# Patient Record
Sex: Female | Born: 1955 | Race: White | Hispanic: No | Marital: Married | Smoking: Never smoker
Health system: Southern US, Community
[De-identification: ages and names within clinical notes are randomized; demographics above are authoritative.]

## PROBLEM LIST (undated history)

## (undated) DIAGNOSIS — Z62819 Personal history of unspecified abuse in childhood: Secondary | ICD-10-CM

## (undated) DIAGNOSIS — K760 Fatty (change of) liver, not elsewhere classified: Secondary | ICD-10-CM

## (undated) DIAGNOSIS — F419 Anxiety disorder, unspecified: Secondary | ICD-10-CM

## (undated) DIAGNOSIS — T7840XA Allergy, unspecified, initial encounter: Secondary | ICD-10-CM

## (undated) DIAGNOSIS — F988 Other specified behavioral and emotional disorders with onset usually occurring in childhood and adolescence: Secondary | ICD-10-CM

## (undated) DIAGNOSIS — D649 Anemia, unspecified: Secondary | ICD-10-CM

## (undated) DIAGNOSIS — Z860100 Personal history of colon polyps, unspecified: Secondary | ICD-10-CM

## (undated) DIAGNOSIS — Z8719 Personal history of other diseases of the digestive system: Secondary | ICD-10-CM

## (undated) DIAGNOSIS — D219 Benign neoplasm of connective and other soft tissue, unspecified: Secondary | ICD-10-CM

## (undated) DIAGNOSIS — K623 Rectal prolapse: Secondary | ICD-10-CM

## (undated) DIAGNOSIS — M1990 Unspecified osteoarthritis, unspecified site: Secondary | ICD-10-CM

## (undated) DIAGNOSIS — H269 Unspecified cataract: Secondary | ICD-10-CM

## (undated) DIAGNOSIS — M810 Age-related osteoporosis without current pathological fracture: Secondary | ICD-10-CM

## (undated) DIAGNOSIS — U071 COVID-19: Secondary | ICD-10-CM

## (undated) DIAGNOSIS — K589 Irritable bowel syndrome without diarrhea: Secondary | ICD-10-CM

## (undated) DIAGNOSIS — R51 Headache: Secondary | ICD-10-CM

## (undated) DIAGNOSIS — E785 Hyperlipidemia, unspecified: Secondary | ICD-10-CM

## (undated) DIAGNOSIS — K219 Gastro-esophageal reflux disease without esophagitis: Secondary | ICD-10-CM

## (undated) DIAGNOSIS — K649 Unspecified hemorrhoids: Secondary | ICD-10-CM

## (undated) DIAGNOSIS — K602 Anal fissure, unspecified: Secondary | ICD-10-CM

## (undated) HISTORY — PX: COLONOSCOPY: SHX174

## (undated) HISTORY — DX: Anemia, unspecified: D64.9

## (undated) HISTORY — DX: Headache: R51

## (undated) HISTORY — PX: DILATION AND CURETTAGE OF UTERUS: SHX78

## (undated) HISTORY — DX: Unspecified hemorrhoids: K64.9

## (undated) HISTORY — DX: Personal history of colonic polyps: Z86.010

## (undated) HISTORY — DX: Other specified behavioral and emotional disorders with onset usually occurring in childhood and adolescence: F98.8

## (undated) HISTORY — PX: CATARACT EXTRACTION, BILATERAL: SHX1313

## (undated) HISTORY — DX: Allergy, unspecified, initial encounter: T78.40XA

## (undated) HISTORY — DX: Anal fissure, unspecified: K60.2

## (undated) HISTORY — DX: Personal history of colon polyps, unspecified: Z86.0100

## (undated) HISTORY — DX: Hyperlipidemia, unspecified: E78.5

## (undated) HISTORY — DX: Personal history of unspecified abuse in childhood: Z62.819

## (undated) HISTORY — DX: Age-related osteoporosis without current pathological fracture: M81.0

## (undated) HISTORY — PX: DENTAL SURGERY: SHX609

## (undated) HISTORY — DX: Unspecified cataract: H26.9

## (undated) HISTORY — PX: HEMORRHOID BANDING: SHX5850

## (undated) HISTORY — DX: Gastro-esophageal reflux disease without esophagitis: K21.9

## (undated) HISTORY — DX: COVID-19: U07.1

## (undated) HISTORY — DX: Benign neoplasm of connective and other soft tissue, unspecified: D21.9

## (undated) HISTORY — DX: Rectal prolapse: K62.3

---
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
## 1975-09-04 HISTORY — PX: TONSILLECTOMY: SUR1361

## 1976-09-03 HISTORY — PX: RHINOPLASTY: SUR1284

## 1998-07-07 ENCOUNTER — Other Ambulatory Visit: Admission: RE | Admit: 1998-07-07 | Discharge: 1998-07-07 | Payer: Self-pay | Admitting: Obstetrics and Gynecology

## 1999-09-27 ENCOUNTER — Other Ambulatory Visit: Admission: RE | Admit: 1999-09-27 | Discharge: 1999-09-27 | Payer: Self-pay | Admitting: Obstetrics and Gynecology

## 2000-08-20 ENCOUNTER — Other Ambulatory Visit: Admission: RE | Admit: 2000-08-20 | Discharge: 2000-08-20 | Payer: Self-pay | Admitting: Internal Medicine

## 2002-01-15 ENCOUNTER — Encounter: Payer: Self-pay | Admitting: Gastroenterology

## 2002-02-09 ENCOUNTER — Other Ambulatory Visit: Admission: RE | Admit: 2002-02-09 | Discharge: 2002-02-09 | Payer: Self-pay | Admitting: Internal Medicine

## 2003-05-28 ENCOUNTER — Ambulatory Visit (HOSPITAL_COMMUNITY): Admission: RE | Admit: 2003-05-28 | Discharge: 2003-05-28 | Payer: Self-pay | Admitting: Gastroenterology

## 2003-05-28 ENCOUNTER — Encounter (INDEPENDENT_AMBULATORY_CARE_PROVIDER_SITE_OTHER): Payer: Self-pay | Admitting: *Deleted

## 2004-10-24 ENCOUNTER — Ambulatory Visit: Payer: Self-pay | Admitting: Internal Medicine

## 2005-01-12 ENCOUNTER — Ambulatory Visit: Payer: Self-pay | Admitting: Internal Medicine

## 2005-01-24 ENCOUNTER — Other Ambulatory Visit: Admission: RE | Admit: 2005-01-24 | Discharge: 2005-01-24 | Payer: Self-pay | Admitting: Internal Medicine

## 2005-01-24 ENCOUNTER — Ambulatory Visit: Payer: Self-pay | Admitting: Internal Medicine

## 2005-03-21 ENCOUNTER — Ambulatory Visit: Payer: Self-pay | Admitting: Internal Medicine

## 2005-03-28 ENCOUNTER — Ambulatory Visit: Payer: Self-pay | Admitting: Internal Medicine

## 2005-07-25 ENCOUNTER — Ambulatory Visit (HOSPITAL_COMMUNITY): Admission: RE | Admit: 2005-07-25 | Discharge: 2005-07-25 | Payer: Self-pay | Admitting: *Deleted

## 2005-07-25 ENCOUNTER — Encounter (INDEPENDENT_AMBULATORY_CARE_PROVIDER_SITE_OTHER): Payer: Self-pay | Admitting: Specialist

## 2005-09-03 HISTORY — PX: HEMORRHOID SURGERY: SHX153

## 2005-12-06 ENCOUNTER — Ambulatory Visit: Payer: Self-pay | Admitting: Family Medicine

## 2005-12-17 ENCOUNTER — Ambulatory Visit: Payer: Self-pay | Admitting: Internal Medicine

## 2006-04-02 ENCOUNTER — Ambulatory Visit: Payer: Self-pay | Admitting: Internal Medicine

## 2006-04-30 ENCOUNTER — Ambulatory Visit: Payer: Self-pay | Admitting: Internal Medicine

## 2006-07-15 ENCOUNTER — Ambulatory Visit: Payer: Self-pay | Admitting: Internal Medicine

## 2006-07-16 ENCOUNTER — Encounter (INDEPENDENT_AMBULATORY_CARE_PROVIDER_SITE_OTHER): Payer: Self-pay | Admitting: Specialist

## 2006-07-16 ENCOUNTER — Ambulatory Visit: Payer: Self-pay | Admitting: Internal Medicine

## 2006-07-16 LAB — HM COLONOSCOPY

## 2006-07-30 ENCOUNTER — Ambulatory Visit: Payer: Self-pay | Admitting: Internal Medicine

## 2006-09-10 ENCOUNTER — Ambulatory Visit: Payer: Self-pay | Admitting: Internal Medicine

## 2006-11-06 ENCOUNTER — Ambulatory Visit: Payer: Self-pay | Admitting: Internal Medicine

## 2007-01-29 ENCOUNTER — Ambulatory Visit: Payer: Self-pay | Admitting: Internal Medicine

## 2007-03-03 ENCOUNTER — Ambulatory Visit: Payer: Self-pay | Admitting: Internal Medicine

## 2007-03-03 LAB — CONVERTED CEMR LAB
ALT: 24 units/L (ref 0–35)
AST: 24 units/L (ref 0–37)
Albumin: 3.2 g/dL — ABNORMAL LOW (ref 3.5–5.2)
Alkaline Phosphatase: 59 units/L (ref 39–117)
BUN: 12 mg/dL (ref 6–23)
Basophils Absolute: 0 10*3/uL (ref 0.0–0.1)
Basophils Relative: 0.1 % (ref 0.0–1.0)
Bilirubin, Direct: 0.1 mg/dL (ref 0.0–0.3)
CO2: 27 meq/L (ref 19–32)
Calcium: 8.7 mg/dL (ref 8.4–10.5)
Chloride: 111 meq/L (ref 96–112)
Cholesterol: 247 mg/dL (ref 0–200)
Creatinine, Ser: 0.8 mg/dL (ref 0.4–1.2)
Direct LDL: 174.5 mg/dL
Eosinophils Absolute: 0.2 10*3/uL (ref 0.0–0.6)
Eosinophils Relative: 2.7 % (ref 0.0–5.0)
GFR calc Af Amer: 97 mL/min
GFR calc non Af Amer: 80 mL/min
Glucose, Bld: 106 mg/dL — ABNORMAL HIGH (ref 70–99)
HCT: 41.8 % (ref 36.0–46.0)
HDL: 54.7 mg/dL (ref >39.0)
Hemoglobin: 14 g/dL (ref 12.0–15.0)
Lymphocytes Relative: 24.7 % (ref 12.0–46.0)
MCHC: 33.5 g/dL (ref 30.0–36.0)
MCV: 81.3 fL (ref 78.0–100.0)
Monocytes Absolute: 0.6 10*3/uL (ref 0.2–0.7)
Monocytes Relative: 8 % (ref 3.0–11.0)
Neutro Abs: 5.1 10*3/uL (ref 1.4–7.7)
Neutrophils Relative %: 64.5 % (ref 43.0–77.0)
Platelets: 255 10*3/uL (ref 150–400)
Potassium: 4.4 meq/L (ref 3.5–5.1)
RBC: 5.15 M/uL — ABNORMAL HIGH (ref 3.87–5.11)
RDW: 15 % — ABNORMAL HIGH (ref 11.5–14.6)
Sodium: 141 meq/L (ref 135–145)
TSH: 3.45 microintl units/mL (ref 0.35–5.50)
Total Bilirubin: 0.5 mg/dL (ref 0.3–1.2)
Total CHOL/HDL Ratio: 4.5
Total Protein: 6.3 g/dL (ref 6.0–8.3)
Triglycerides: 166 mg/dL — ABNORMAL HIGH (ref 0–149)
VLDL: 33 mg/dL (ref 0–40)
WBC: 7.9 10*3/uL (ref 4.5–10.5)

## 2007-03-10 ENCOUNTER — Ambulatory Visit: Payer: Self-pay | Admitting: Internal Medicine

## 2007-03-25 ENCOUNTER — Ambulatory Visit: Payer: Self-pay | Admitting: Pulmonary Disease

## 2007-07-09 ENCOUNTER — Ambulatory Visit: Payer: Self-pay | Admitting: Internal Medicine

## 2007-07-09 DIAGNOSIS — R7301 Impaired fasting glucose: Secondary | ICD-10-CM | POA: Insufficient documentation

## 2007-07-09 DIAGNOSIS — E785 Hyperlipidemia, unspecified: Secondary | ICD-10-CM | POA: Insufficient documentation

## 2007-07-10 LAB — CONVERTED CEMR LAB
ALT: 30 units/L (ref 0–35)
AST: 23 units/L (ref 0–37)
Cholesterol: 193 mg/dL (ref 0–200)
Glucose, Bld: 90 mg/dL (ref 70–99)
HDL: 49.6 mg/dL (ref >39.0)
LDL Cholesterol: 118 mg/dL — ABNORMAL HIGH (ref 0–99)
Total CHOL/HDL Ratio: 3.9
Triglycerides: 125 mg/dL (ref 0–149)
VLDL: 25 mg/dL (ref 0–40)

## 2007-07-17 ENCOUNTER — Ambulatory Visit: Payer: Self-pay | Admitting: Internal Medicine

## 2007-07-17 DIAGNOSIS — K219 Gastro-esophageal reflux disease without esophagitis: Secondary | ICD-10-CM | POA: Insufficient documentation

## 2007-07-17 DIAGNOSIS — J040 Acute laryngitis: Secondary | ICD-10-CM | POA: Insufficient documentation

## 2007-07-17 DIAGNOSIS — F988 Other specified behavioral and emotional disorders with onset usually occurring in childhood and adolescence: Secondary | ICD-10-CM | POA: Insufficient documentation

## 2007-07-17 LAB — CONVERTED CEMR LAB
Cholesterol, target level: 200 mg/dL
HDL goal, serum: 40 mg/dL
LDL Goal: 160 mg/dL

## 2007-08-15 ENCOUNTER — Ambulatory Visit: Payer: Self-pay | Admitting: Internal Medicine

## 2007-08-15 DIAGNOSIS — Z860100 Personal history of colon polyps, unspecified: Secondary | ICD-10-CM | POA: Insufficient documentation

## 2007-08-15 DIAGNOSIS — D649 Anemia, unspecified: Secondary | ICD-10-CM | POA: Insufficient documentation

## 2007-08-15 DIAGNOSIS — J309 Allergic rhinitis, unspecified: Secondary | ICD-10-CM | POA: Insufficient documentation

## 2007-08-15 DIAGNOSIS — R51 Headache: Secondary | ICD-10-CM | POA: Insufficient documentation

## 2007-08-15 DIAGNOSIS — R519 Headache, unspecified: Secondary | ICD-10-CM | POA: Insufficient documentation

## 2007-11-11 ENCOUNTER — Ambulatory Visit: Payer: Self-pay | Admitting: Internal Medicine

## 2007-11-11 DIAGNOSIS — N951 Menopausal and female climacteric states: Secondary | ICD-10-CM | POA: Insufficient documentation

## 2007-11-11 LAB — CONVERTED CEMR LAB: Blood Glucose, Fingerstick: 119

## 2008-03-04 ENCOUNTER — Telehealth: Payer: Self-pay | Admitting: Internal Medicine

## 2008-03-08 ENCOUNTER — Encounter: Payer: Self-pay | Admitting: Internal Medicine

## 2008-03-17 ENCOUNTER — Ambulatory Visit: Payer: Self-pay | Admitting: Internal Medicine

## 2008-03-17 LAB — CONVERTED CEMR LAB
Bilirubin Urine: NEGATIVE
Ketones, urine, test strip: NEGATIVE
Nitrite: NEGATIVE
Protein, U semiquant: NEGATIVE
Specific Gravity, Urine: >=1.03
Urobilinogen, UA: 0.2
WBC Urine, dipstick: NEGATIVE
pH: 5

## 2008-03-19 LAB — CONVERTED CEMR LAB
ALT: 45 units/L — ABNORMAL HIGH (ref 0–35)
AST: 40 units/L — ABNORMAL HIGH (ref 0–37)
Albumin: 3.5 g/dL (ref 3.5–5.2)
Alkaline Phosphatase: 62 units/L (ref 39–117)
BUN: 17 mg/dL (ref 6–23)
Basophils Absolute: 0 10*3/uL (ref 0.0–0.1)
Basophils Relative: 0.4 % (ref 0.0–1.0)
Bilirubin, Direct: 0.1 mg/dL (ref 0.0–0.3)
CO2: 29 meq/L (ref 19–32)
Calcium: 9.1 mg/dL (ref 8.4–10.5)
Chloride: 108 meq/L (ref 96–112)
Cholesterol: 171 mg/dL (ref 0–200)
Creatinine, Ser: 0.8 mg/dL (ref 0.4–1.2)
Eosinophils Absolute: 0.1 10*3/uL (ref 0.0–0.7)
Eosinophils Relative: 1 % (ref 0.0–5.0)
GFR calc Af Amer: 96 mL/min
GFR calc non Af Amer: 80 mL/min
Glucose, Bld: 132 mg/dL — ABNORMAL HIGH (ref 70–99)
HCT: 38.6 % (ref 36.0–46.0)
HDL: 52.4 mg/dL (ref >39.0)
Hemoglobin: 13.5 g/dL (ref 12.0–15.0)
LDL Cholesterol: 98 mg/dL (ref 0–99)
Lymphocytes Relative: 17.9 % (ref 12.0–46.0)
MCHC: 35.1 g/dL (ref 30.0–36.0)
MCV: 81.7 fL (ref 78.0–100.0)
Monocytes Absolute: 0.9 10*3/uL (ref 0.1–1.0)
Monocytes Relative: 9.1 % (ref 3.0–12.0)
Neutro Abs: 6.9 10*3/uL (ref 1.4–7.7)
Neutrophils Relative %: 71.6 % (ref 43.0–77.0)
Platelets: 227 10*3/uL (ref 150–400)
Potassium: 4.4 meq/L (ref 3.5–5.1)
RBC: 4.73 M/uL (ref 3.87–5.11)
RDW: 14.1 % (ref 11.5–14.6)
Sodium: 142 meq/L (ref 135–145)
TSH: 2.03 microintl units/mL (ref 0.35–5.50)
Total Bilirubin: 0.6 mg/dL (ref 0.3–1.2)
Total CHOL/HDL Ratio: 3.3
Total Protein: 6.3 g/dL (ref 6.0–8.3)
Triglycerides: 103 mg/dL (ref 0–149)
VLDL: 21 mg/dL (ref 0–40)
WBC: 9.6 10*3/uL (ref 4.5–10.5)

## 2008-03-24 ENCOUNTER — Other Ambulatory Visit: Admission: RE | Admit: 2008-03-24 | Discharge: 2008-03-24 | Payer: Self-pay | Admitting: Internal Medicine

## 2008-03-24 ENCOUNTER — Ambulatory Visit: Payer: Self-pay | Admitting: Internal Medicine

## 2008-03-24 ENCOUNTER — Encounter: Payer: Self-pay | Admitting: Internal Medicine

## 2008-03-24 DIAGNOSIS — R740 Nonspecific elevation of levels of transaminase and lactic acid dehydrogenase [LDH]: Secondary | ICD-10-CM

## 2008-03-24 DIAGNOSIS — E669 Obesity, unspecified: Secondary | ICD-10-CM | POA: Insufficient documentation

## 2008-03-24 DIAGNOSIS — R7401 Elevation of levels of liver transaminase levels: Secondary | ICD-10-CM | POA: Insufficient documentation

## 2008-04-28 DIAGNOSIS — Z872 Personal history of diseases of the skin and subcutaneous tissue: Secondary | ICD-10-CM | POA: Insufficient documentation

## 2008-04-29 ENCOUNTER — Ambulatory Visit: Payer: Self-pay | Admitting: Internal Medicine

## 2008-04-29 DIAGNOSIS — R1084 Generalized abdominal pain: Secondary | ICD-10-CM | POA: Insufficient documentation

## 2008-04-30 ENCOUNTER — Ambulatory Visit (HOSPITAL_COMMUNITY): Admission: RE | Admit: 2008-04-30 | Discharge: 2008-04-30 | Payer: Self-pay | Admitting: Internal Medicine

## 2008-05-25 ENCOUNTER — Ambulatory Visit: Payer: Self-pay | Admitting: Internal Medicine

## 2008-05-25 LAB — CONVERTED CEMR LAB
ALT: 40 units/L — ABNORMAL HIGH (ref 0–35)
AST: 40 units/L — ABNORMAL HIGH (ref 0–37)
Albumin: 3.2 g/dL — ABNORMAL LOW (ref 3.5–5.2)
Alkaline Phosphatase: 67 units/L (ref 39–117)
Bilirubin, Direct: 0.1 mg/dL (ref 0.0–0.3)
Hgb A1c MFr Bld: 6.9 % — ABNORMAL HIGH (ref 4.6–6.0)
Total Bilirubin: 0.5 mg/dL (ref 0.3–1.2)
Total Protein: 5.7 g/dL — ABNORMAL LOW (ref 6.0–8.3)

## 2008-05-26 ENCOUNTER — Ambulatory Visit: Payer: Self-pay | Admitting: Internal Medicine

## 2008-05-26 DIAGNOSIS — B029 Zoster without complications: Secondary | ICD-10-CM | POA: Insufficient documentation

## 2008-05-27 ENCOUNTER — Telehealth: Payer: Self-pay | Admitting: Internal Medicine

## 2008-06-01 ENCOUNTER — Ambulatory Visit: Payer: Self-pay | Admitting: Internal Medicine

## 2008-06-01 DIAGNOSIS — K7689 Other specified diseases of liver: Secondary | ICD-10-CM | POA: Insufficient documentation

## 2008-09-10 ENCOUNTER — Ambulatory Visit: Payer: Self-pay | Admitting: Internal Medicine

## 2008-09-10 LAB — CONVERTED CEMR LAB
ALT: 38 units/L — ABNORMAL HIGH (ref 0–35)
AST: 33 units/L (ref 0–37)
Albumin: 3 g/dL — ABNORMAL LOW (ref 3.5–5.2)
Alkaline Phosphatase: 67 units/L (ref 39–117)
Bilirubin, Direct: 0.1 mg/dL (ref 0.0–0.3)
Hgb A1c MFr Bld: 6.9 % — ABNORMAL HIGH (ref 4.6–6.0)
Total Bilirubin: 0.6 mg/dL (ref 0.3–1.2)
Total Protein: 5.8 g/dL — ABNORMAL LOW (ref 6.0–8.3)

## 2008-09-17 ENCOUNTER — Ambulatory Visit: Payer: Self-pay | Admitting: Internal Medicine

## 2008-11-29 ENCOUNTER — Telehealth: Payer: Self-pay | Admitting: Internal Medicine

## 2009-02-08 ENCOUNTER — Ambulatory Visit: Payer: Self-pay | Admitting: Internal Medicine

## 2009-02-08 DIAGNOSIS — R609 Edema, unspecified: Secondary | ICD-10-CM | POA: Insufficient documentation

## 2009-02-08 DIAGNOSIS — T50995A Adverse effect of other drugs, medicaments and biological substances, initial encounter: Secondary | ICD-10-CM | POA: Insufficient documentation

## 2009-02-08 LAB — CONVERTED CEMR LAB
Bilirubin Urine: NEGATIVE
Blood in Urine, dipstick: NEGATIVE
Glucose, Urine, Semiquant: NEGATIVE
Ketones, urine, test strip: NEGATIVE
Nitrite: NEGATIVE
Protein, U semiquant: NEGATIVE
Specific Gravity, Urine: 1.025
Urobilinogen, UA: 0.2
pH: 5

## 2009-02-11 LAB — CONVERTED CEMR LAB
ALT: 40 units/L — ABNORMAL HIGH (ref 0–35)
AST: 28 units/L (ref 0–37)
Albumin: 3.4 g/dL — ABNORMAL LOW (ref 3.5–5.2)
Alkaline Phosphatase: 62 units/L (ref 39–117)
BUN: 16 mg/dL (ref 6–23)
Basophils Absolute: 0.1 10*3/uL (ref 0.0–0.1)
Basophils Relative: 0.8 % (ref 0.0–3.0)
Bilirubin, Direct: 0 mg/dL (ref 0.0–0.3)
CO2: 30 meq/L (ref 19–32)
Calcium: 8.7 mg/dL (ref 8.4–10.5)
Chloride: 112 meq/L (ref 96–112)
Cholesterol: 152 mg/dL (ref 0–200)
Creatinine, Ser: 0.9 mg/dL (ref 0.4–1.2)
Eosinophils Absolute: 0.1 10*3/uL (ref 0.0–0.7)
Eosinophils Relative: 1.9 % (ref 0.0–5.0)
GFR calc non Af Amer: 69.21 mL/min (ref >60)
Glucose, Bld: 122 mg/dL — ABNORMAL HIGH (ref 70–99)
HCT: 36.6 % (ref 36.0–46.0)
HDL: 49.9 mg/dL (ref >39.00)
Hemoglobin: 12.2 g/dL (ref 12.0–15.0)
Hgb A1c MFr Bld: 7.2 % — ABNORMAL HIGH (ref 4.6–6.5)
LDL Cholesterol: 84 mg/dL (ref 0–99)
Lymphocytes Relative: 21.7 % (ref 12.0–46.0)
Lymphs Abs: 1.7 10*3/uL (ref 0.7–4.0)
MCHC: 33.4 g/dL (ref 30.0–36.0)
MCV: 82 fL (ref 78.0–100.0)
Monocytes Absolute: 0.6 10*3/uL (ref 0.1–1.0)
Monocytes Relative: 7.2 % (ref 3.0–12.0)
Neutro Abs: 5.2 10*3/uL (ref 1.4–7.7)
Neutrophils Relative %: 68.4 % (ref 43.0–77.0)
Platelets: 186 10*3/uL (ref 150.0–400.0)
Potassium: 4 meq/L (ref 3.5–5.1)
RBC: 4.46 M/uL (ref 3.87–5.11)
RDW: 13.7 % (ref 11.5–14.6)
Sodium: 144 meq/L (ref 135–145)
TSH: 1.46 microintl units/mL (ref 0.35–5.50)
Total Bilirubin: 0.6 mg/dL (ref 0.3–1.2)
Total CHOL/HDL Ratio: 3
Total Protein: 6.6 g/dL (ref 6.0–8.3)
Triglycerides: 90 mg/dL (ref 0.0–149.0)
VLDL: 18 mg/dL (ref 0.0–40.0)
WBC: 7.7 10*3/uL (ref 4.5–10.5)

## 2009-02-23 ENCOUNTER — Other Ambulatory Visit: Admission: RE | Admit: 2009-02-23 | Discharge: 2009-02-23 | Payer: Self-pay | Admitting: Internal Medicine

## 2009-02-23 ENCOUNTER — Encounter: Payer: Self-pay | Admitting: Internal Medicine

## 2009-02-23 ENCOUNTER — Ambulatory Visit: Payer: Self-pay | Admitting: Internal Medicine

## 2009-02-23 LAB — CONVERTED CEMR LAB: Blood Glucose, Fingerstick: 132

## 2009-03-21 ENCOUNTER — Encounter: Payer: Self-pay | Admitting: Internal Medicine

## 2009-04-05 ENCOUNTER — Telehealth: Payer: Self-pay | Admitting: Internal Medicine

## 2009-05-03 ENCOUNTER — Encounter: Payer: Self-pay | Admitting: Internal Medicine

## 2009-08-23 ENCOUNTER — Telehealth: Payer: Self-pay | Admitting: *Deleted

## 2009-09-09 ENCOUNTER — Ambulatory Visit: Payer: Self-pay | Admitting: Internal Medicine

## 2009-09-09 DIAGNOSIS — M79609 Pain in unspecified limb: Secondary | ICD-10-CM | POA: Insufficient documentation

## 2009-09-09 LAB — CONVERTED CEMR LAB: Blood Glucose, Fingerstick: 139

## 2009-09-12 LAB — CONVERTED CEMR LAB
ALT: 60 units/L — ABNORMAL HIGH (ref 0–35)
AST: 57 units/L — ABNORMAL HIGH (ref 0–37)
Albumin: 3.4 g/dL — ABNORMAL LOW (ref 3.5–5.2)
Alkaline Phosphatase: 68 units/L (ref 39–117)
BUN: 11 mg/dL (ref 6–23)
Bilirubin, Direct: 0.1 mg/dL (ref 0.0–0.3)
CO2: 28 meq/L (ref 19–32)
Calcium: 8.9 mg/dL (ref 8.4–10.5)
Chloride: 108 meq/L (ref 96–112)
Creatinine, Ser: 0.9 mg/dL (ref 0.4–1.2)
GFR calc non Af Amer: 69.06 mL/min (ref >60)
Glucose, Bld: 134 mg/dL — ABNORMAL HIGH (ref 70–99)
Hgb A1c MFr Bld: 7.5 % — ABNORMAL HIGH (ref 4.6–6.5)
Potassium: 3.7 meq/L (ref 3.5–5.1)
Sodium: 142 meq/L (ref 135–145)
Total Bilirubin: 0.7 mg/dL (ref 0.3–1.2)
Total Protein: 6.6 g/dL (ref 6.0–8.3)

## 2009-11-18 ENCOUNTER — Ambulatory Visit: Payer: Self-pay | Admitting: Internal Medicine

## 2009-11-18 DIAGNOSIS — E1165 Type 2 diabetes mellitus with hyperglycemia: Secondary | ICD-10-CM | POA: Insufficient documentation

## 2009-11-18 DIAGNOSIS — Z794 Long term (current) use of insulin: Secondary | ICD-10-CM

## 2009-11-18 LAB — HM DIABETES FOOT EXAM

## 2009-11-18 LAB — CONVERTED CEMR LAB: LDL Goal: 100 mg/dL

## 2009-11-29 LAB — CONVERTED CEMR LAB
ALT: 46 units/L — ABNORMAL HIGH (ref 0–35)
AST: 32 units/L (ref 0–37)
Albumin: 3.6 g/dL (ref 3.5–5.2)
Alkaline Phosphatase: 74 units/L (ref 39–117)
BUN: 14 mg/dL (ref 6–23)
Bilirubin, Direct: 0.1 mg/dL (ref 0.0–0.3)
CO2: 30 meq/L (ref 19–32)
Calcium: 8.9 mg/dL (ref 8.4–10.5)
Chloride: 107 meq/L (ref 96–112)
Creatinine, Ser: 0.9 mg/dL (ref 0.4–1.2)
GFR calc non Af Amer: 69.01 mL/min (ref >60)
Glucose, Bld: 123 mg/dL — ABNORMAL HIGH (ref 70–99)
Hgb A1c MFr Bld: 7.5 % — ABNORMAL HIGH (ref 4.6–6.5)
Potassium: 4.6 meq/L (ref 3.5–5.1)
Sodium: 143 meq/L (ref 135–145)
Total Bilirubin: 0.3 mg/dL (ref 0.3–1.2)
Total Protein: 6.9 g/dL (ref 6.0–8.3)

## 2010-01-16 ENCOUNTER — Telehealth: Payer: Self-pay | Admitting: *Deleted

## 2010-02-21 ENCOUNTER — Telehealth: Payer: Self-pay | Admitting: *Deleted

## 2010-02-21 ENCOUNTER — Ambulatory Visit: Payer: Self-pay | Admitting: Internal Medicine

## 2010-02-21 LAB — CONVERTED CEMR LAB
ALT: 40 units/L — ABNORMAL HIGH (ref 0–35)
AST: 30 units/L (ref 0–37)
Albumin: 3.5 g/dL (ref 3.5–5.2)
Alkaline Phosphatase: 76 units/L (ref 39–117)
BUN: 16 mg/dL (ref 6–23)
Basophils Absolute: 0 10*3/uL (ref 0.0–0.1)
Basophils Relative: 0.4 % (ref 0.0–3.0)
Bilirubin Urine: NEGATIVE
Bilirubin, Direct: 0.1 mg/dL (ref 0.0–0.3)
Blood in Urine, dipstick: NEGATIVE
CO2: 28 meq/L (ref 19–32)
Calcium: 8.7 mg/dL (ref 8.4–10.5)
Chloride: 107 meq/L (ref 96–112)
Cholesterol: 161 mg/dL (ref 0–200)
Creatinine, Ser: 1 mg/dL (ref 0.4–1.2)
Creatinine,U: 254.7 mg/dL
Eosinophils Absolute: 0.3 10*3/uL (ref 0.0–0.7)
Eosinophils Relative: 4.5 % (ref 0.0–5.0)
GFR calc non Af Amer: 63.24 mL/min (ref >60)
Glucose, Bld: 163 mg/dL — ABNORMAL HIGH (ref 70–99)
Glucose, Urine, Semiquant: NEGATIVE
HCT: 38.6 % (ref 36.0–46.0)
HDL: 49 mg/dL (ref >39.00)
Hemoglobin: 12.9 g/dL (ref 12.0–15.0)
Hgb A1c MFr Bld: 7.4 % — ABNORMAL HIGH (ref 4.6–6.5)
Ketones, urine, test strip: NEGATIVE
LDL Cholesterol: 90 mg/dL (ref 0–99)
Lymphocytes Relative: 25.2 % (ref 12.0–46.0)
Lymphs Abs: 1.7 10*3/uL (ref 0.7–4.0)
MCHC: 33.6 g/dL (ref 30.0–36.0)
MCV: 83.1 fL (ref 78.0–100.0)
Microalb Creat Ratio: 0.4 mg/g (ref 0.0–30.0)
Microalb, Ur: 0.9 mg/dL (ref 0.0–1.9)
Monocytes Absolute: 0.5 10*3/uL (ref 0.1–1.0)
Monocytes Relative: 7.7 % (ref 3.0–12.0)
Neutro Abs: 4.2 10*3/uL (ref 1.4–7.7)
Neutrophils Relative %: 62.2 % (ref 43.0–77.0)
Nitrite: NEGATIVE
Platelets: 174 10*3/uL (ref 150.0–400.0)
Potassium: 4.7 meq/L (ref 3.5–5.1)
Protein, U semiquant: NEGATIVE
RBC: 4.64 M/uL (ref 3.87–5.11)
RDW: 15.3 % — ABNORMAL HIGH (ref 11.5–14.6)
Sodium: 144 meq/L (ref 135–145)
Specific Gravity, Urine: >=1.03
TSH: 2.26 microintl units/mL (ref 0.35–5.50)
Total Bilirubin: 0.4 mg/dL (ref 0.3–1.2)
Total CHOL/HDL Ratio: 3
Total Protein: 6.2 g/dL (ref 6.0–8.3)
Triglycerides: 111 mg/dL (ref 0.0–149.0)
Urobilinogen, UA: 0.2
VLDL: 22.2 mg/dL (ref 0.0–40.0)
WBC: 6.8 10*3/uL (ref 4.5–10.5)
pH: 5

## 2010-02-28 ENCOUNTER — Other Ambulatory Visit: Admission: RE | Admit: 2010-02-28 | Discharge: 2010-02-28 | Payer: Self-pay | Admitting: Internal Medicine

## 2010-02-28 ENCOUNTER — Ambulatory Visit: Payer: Self-pay | Admitting: Internal Medicine

## 2010-02-28 DIAGNOSIS — F4323 Adjustment disorder with mixed anxiety and depressed mood: Secondary | ICD-10-CM | POA: Insufficient documentation

## 2010-02-28 LAB — HM PAP SMEAR

## 2010-03-01 ENCOUNTER — Encounter: Payer: Self-pay | Admitting: Internal Medicine

## 2010-04-25 ENCOUNTER — Encounter: Payer: Self-pay | Admitting: Internal Medicine

## 2010-04-25 LAB — HM DIABETES EYE EXAM: HM Diabetic Eye Exam: NORMAL

## 2010-05-03 ENCOUNTER — Encounter: Payer: Self-pay | Admitting: Internal Medicine

## 2010-05-10 ENCOUNTER — Telehealth: Payer: Self-pay | Admitting: *Deleted

## 2010-05-25 ENCOUNTER — Encounter: Payer: Self-pay | Admitting: *Deleted

## 2010-08-04 ENCOUNTER — Telehealth: Payer: Self-pay | Admitting: Internal Medicine

## 2010-10-03 NOTE — Progress Notes (Signed)
Summary: med refill  Phone Note Refill Request Call back at Home Phone [phone] Message from:  [facility] [phone]  Refills Requested: Medication #1:  CITALOPRAM HYDROBROMIDE 20 MG TABS 1 by mouth once daily pt needs  refill med is working 80 percent. pt will make ov  Initial call taken by: Heron Sabins,  August 04, 2010 12:28 PM  Follow-up for Phone Call        tell patient  sent rx in for refill   Follow-up by: Madelin Headings MD,  August 04, 2010 5:36 PM  Additional Follow-up for Phone Call Additional follow up Details #1::        called pt - ans mach  - LMTCB if questions - [facility] Additional Follow-up by: Duard Brady LPN,  August 04, 2010 5:53 PM    Prescriptions: CITALOPRAM HYDROBROMIDE 20 MG TABS (CITALOPRAM HYDROBROMIDE) 1 by mouth once daily  #30 x 2   Entered and Authorized by:   Madelin Headings MD   Signed by:   Madelin Headings MD on 08/04/2010   Method used:   Electronically to        [facility]       [address]       [place], [place]  [address]       Ph: [phone]       Fax: [phone]   RxID:   [id number]

## 2010-10-03 NOTE — Assessment & Plan Note (Signed)
Summary: 2 month rov/pt will come in fasting/njr   Vital Signs:  Patient profile:   55 year old female Menstrual status:  perimenopausal Height:      64 inches Weight:      254 pounds BMI:     43.76 Temp:     97.8 degrees F Pulse rate:   78 / minute Pulse rhythm:   regular Resp:     12 per minute BP sitting:   120 / 78  Vitals Entered By: Lynann Beaver CMA (November 18, 2009 10:27 AM)  Nutrition Counseling: Patient's BMI is greater than 25 and therefore counseled on weight management options.  Family History: Reviewed history from 09/17/2008 and no changes required. adhd child clotting Family History of Alcoholism/Addiction Family History of Arthritis Family History Hypertension MOM renal Cancer Father Vascular disease Tobacco DM DM, Emotional  alcohol problems in the family Family History of Diabetes: Sisters, Father Family History of Kidney Disease: Cancer-Mother       Social History: Reviewed history from 09/17/2008 and no changes required. Married Never Smoked 2 children No   alcohol     Past History:  Past medical, surgical, family and social histories (including risk factors) reviewed, and no changes noted (except as noted below).  Past Medical History: Allergic rhinitis Anemia-NOS GERD had egd Hyperlipidemia add rectal prolapse  Brodie Headache [place] Colonic polyps, hx of  Kaplan fibroids Anal Fissure Hemorrhoids   Hx of chid abuse  DM dx 2011    Consults: Dr. Lina Sar Dr. Bjorn Pippin Dr. Susa Raring  Past Surgical History: Reviewed history from 09/17/2008 and no changes required. childbirth  G5P2 Hemorrhoidectomy 2007 rhinoplasty 1978   Tonsillectomy D &C X 4   CC: rov Is Patient Diabetic? No Pain Assessment Patient in pain? no        History of Present Illness: .PT  comesin for follow up about new dx of DM .    Has cut out   sugar beverages  and  bread.      No sweet tea.  Continuing [place] and   liptor.   tried   generally  diet change but no intensive.   Is trying to change  gabits taking the [place] without problem NOt on metformin because of diarrhea.   No new diagnosis.  Diabetes Management History:      She says that she is not exercising regularly.    Physical Exam  General:  alert, well-developed, and well-nourished.   Neck:  No deformities, masses, or tenderness noted. Lungs:  Normal respiratory effort, chest expands symmetrically. Lungs are clear to auscultation, no crackles or wheezes. Heart:  Normal rate and regular rhythm. S1 and S2 normal without gallop, murmur, click, rub or other extra sounds. Abdomen:  Bowel sounds positive,abdomen soft and non-tender without masses, organomegaly or   noted.  Diabetes Management Exam:    Foot Exam (with socks and/or shoes not present):       Sensory-Monofilament:          Left foot: normal          Right foot: normal       Inspection:          Left foot: normal          Right foot: normal   Current Medications (verified): 1)  Imodium A-D 2 Mg Tabs (Loperamide Hcl) .... Take 1 Tablet By Mouth Every Night 2)  Lipitor 20 Mg Tabs (Atorvastatin Calcium) .... Take 1 Tablet By Mouth Once A Day  3)  Adderall 10 Mg  Tabs (Amphetamine-Dextroamphetamine) .... Take 3 Per  Day  As Directed. 4)  Nexium 40 Mg  Cpdr (Esomeprazole Magnesium) .Marland Kitchen.. 1 By Mouth Two Times A Day 5)  Peranex Hc 3-1 %  Pads (Lidocaine-Hydrocortisone Ace) .... Apply To Rectum Two Times A Day As Directed 6)  Peranex Hc 2-2 % Kit (Lidocaine-Hydrocortisone Ace) .... Apply To Rectum As Needed 7)  Lasix 20 Mg Tabs (Furosemide) .Marland Kitchen.. 1 By Mouth Once Daily As Directed 8)  Januvia 100 Mg Tabs (Sitagliptin Phosphate) .Marland Kitchen.. 1 By Mouth Once Daily 9)  Alprazolam 0.25 Mg Tabs (Alprazolam) .Marland Kitchen.. 1-2 By Mouth Pre Flight  Allergies (verified): 1)  Codeine Phosphate (Codeine Phosphate)  Review of Systems  The patient denies anorexia, fever, weight loss, chest pain, syncope,  dyspnea on exertion, peripheral edema, abdominal pain, difficulty walking, abnormal bleeding, and angioedema.      Impression & Recommendations:  Problem # 1:  DIABETES MELLITUS (ICD-250.00) Assessment New see past note    now meets criteria  for diabetees albeit early and no symptom .  counseled about impotance of lifestyle intervention unfotuantely had gi se of metformin although poss could retry low dose in the future.   Her updated medication list for this problem includes:    Januvia 100 Mg Tabs (Sitagliptin phosphate) .Marland Kitchen... 1 by mouth once daily  Orders: TLB-BMP (Basic Metabolic Panel-BMET) (80048-METABOL) TLB-A1C / Hgb A1C (Glycohemoglobin) (83036-A1C) Venipuncture ([address])  Problem # 2:  FATTY LIVER DISEASE (ICD-571.8) as above Orders: TLB-Hepatic/Liver Function Pnl (80076-HEPATIC) Venipuncture (60454)  Problem # 3:  OBESITY (ICD-278.00)  Problem # 4:  HYPERLIPIDEMIA (ICD-272.4)  Her updated medication list for this problem includes:    Lipitor 20 Mg Tabs (Atorvastatin calcium) .Marland Kitchen... Take 1 tablet by mouth once a day  Labs Reviewed: SGOT: 57 (09/09/2009)   SGPT: 60 (09/09/2009)  Lipid Goals: Chol Goal: 200 (07/17/2007)   HDL Goal: 40 (07/17/2007)   LDL Goal: 160 (07/17/2007)   TG Goal: 150 (07/17/2007)  Prior 10 Yr Risk Heart Disease: 4 % (11/11/2007)   HDL:49.90 (02/08/2009), 52.4 (03/17/2008)  LDL:84 (02/08/2009), 98 (03/17/2008)  Chol:152 (02/08/2009), 171 (03/17/2008)  Trig:90.0 (02/08/2009), 103 (03/17/2008)  Complete Medication List: 1)  Imodium A-d 2 Mg Tabs (Loperamide hcl) .... Take 1 tablet by mouth every night 2)  Lipitor 20 Mg Tabs (Atorvastatin calcium) .... Take 1 tablet by mouth once a day 3)  Adderall 10 Mg Tabs (Amphetamine-dextroamphetamine) .... Take 3 per  day  as directed. 4)  Nexium 40 Mg Cpdr (Esomeprazole magnesium) .Marland Kitchen.. 1 by mouth two times a day 5)  Peranex Hc 3-1 % Pads (Lidocaine-hydrocortisone ace) .... Apply to rectum two times a day as  directed 6)  Peranex Hc 2-2 % Kit (Lidocaine-hydrocortisone ace) .... Apply to rectum as needed 7)  Lasix 20 Mg Tabs (Furosemide) .Marland Kitchen.. 1 by mouth once daily as directed 8)  Januvia 100 Mg Tabs (Sitagliptin phosphate) .Marland Kitchen.. 1 by mouth once daily 9)  Alprazolam 0.25 Mg Tabs (Alprazolam) .Marland Kitchen.. 1-2 by mouth pre flight  Diabetes Management Assessment/Plan:      The following lipid goals have been established for the patient: Total cholesterol goal of 200; LDL cholesterol goal of 100; HDL cholesterol goal of 40; Triglyceride goal of 150.     Patient Instructions: 1)  You will be informed of lab results when available.  2)  then plan follow up. 3)  Lose weight   continue limiting sugar and calories. 4)  3500 calories is a pound.  greater than 50% of visit spent in counseling  25 minutes

## 2010-10-03 NOTE — Progress Notes (Signed)
Summary: generic rx  Phone Note Call from Patient Call back at Work Phone [phone]   Caller: Patient Call For: Madelin Headings MD Summary of Call: pt needs generic [facility]anax [facility] Initial call taken by: Heron Sabins,  February 21, 2010 9:42 AM  Follow-up for Phone Call        ok to refill x 2  Follow-up by: Madelin Headings MD,  February 21, 2010 6:14 PM  Additional Follow-up for Phone Call Additional follow up Details #1::        Rx called in. Additional Follow-up by: Romualdo Bolk, CMA (AAMA),  February 22, 2010 9:30 AM    Prescriptions: ALPRAZOLAM 0.25 MG TABS (ALPRAZOLAM) 1-2 by mouth pre flight  #10 x 1   Entered by:   Romualdo Bolk, CMA (AAMA)   Authorized by:   Madelin Headings MD   Signed by:   Romualdo Bolk, CMA (AAMA) on 02/22/2010   Method used:   Telephoned to ...       [facility]       [address]       [place], [place]  [address]       Ph: [phone]       Fax: [phone]   RxID:   [id number]

## 2010-10-03 NOTE — Letter (Signed)
Summary: [facility]  [facility]   Imported By: Lennie Odor 05/31/2010 14:48:44  _____________________________________________________________________  External Attachment:    Type:   Image     Comment:   External Document

## 2010-10-03 NOTE — Miscellaneous (Signed)
Summary: Eye Exam   Clinical Lists Changes  Observations: Added new observation of EYES COMMENT: 05/2011 (05/25/2010 14:17) Added new observation of EYE EXAM BY: [facility] (04/25/2010 14:18) Added new observation of DMEYEEXMRES: normal (04/25/2010 14:18) Added new observation of DIAB EYE EX: normal (04/25/2010 14:18)        Diabetes Management History:      She says that she is not exercising regularly.    Diabetes Management Exam:    Eye Exam:       Eye Exam done elsewhere          Date: 04/25/2010          Results: normal          Done by: [facility]  Diabetes Management Assessment/Plan:      The following lipid goals have been established for the patient: Total cholesterol goal of 200; LDL cholesterol goal of 100; HDL cholesterol goal of 40; Triglyceride goal of 150.

## 2010-10-03 NOTE — Medication Information (Signed)
Summary: Authorization Request and Approval-Frova  Authorization Request and Approval-Frova   Imported By: Maryln Gottron 03/03/2010 12:58:25  _____________________________________________________________________  External Attachment:    Type:   Image     Comment:   External Document

## 2010-10-03 NOTE — Assessment & Plan Note (Signed)
Summary: cpx/pap/njr   Vital Signs:  Patient profile:   55 year old female Menstrual status:  perimenopausal LMP:     11/01/2008 Height:      64 inches Weight:      256 pounds Pulse rate:   66 / minute BP sitting:   130 / 76  (right arm) Cuff size:   large  Vitals Entered By: Romualdo Bolk, CMA (AAMA) (February 28, 2010 1:23 PM) CC: CPX with pap LMP (date): 11/01/2008 LMP - Character: heavy Menarche (age onset years): 12 1/2   Menses interval (days): vary Menstrual flow (days): 3 weeks Enter LMP: 11/01/2008 Last PAP Result NEGATIVE FOR INTRAEPITHELIAL LESIONS OR MALIGNANCY.   History of Present Illness: Paige Hernandez comes in today  for preventive visit .   Since last visit  here  there have been no major changes in health status  but has had lots of stresses and not been able to lose weight   LIPids  No se of meds  GERD  nededd 2 per day to help nexiium worsk the best  ADHD needs meds for as needed.   Anxiety ; worse at menopaus Sleep  5-6 hours per day  Migraines :    are back. taking excepdrin or tylenol and getting rebound.  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   Preventive Care Screening  Mammogram:    Date:  03/03/2009    Results:  normal   Prior Values:    Pap Smear:  NEGATIVE FOR INTRAEPITHELIAL LESIONS OR MALIGNANCY. (02/23/2009)    Mammogram:  normal (02/02/2008)    Colonoscopy:  Location:  [facility].   (07/16/2006)    Last Tetanus Booster:  Historical (09/03/2002)    Last Flu Shot:  Fluvax 3+ (09/09/2009)   Preventive Screening-Counseling & Management  Alcohol-Tobacco     Alcohol drinks/day: 0     Smoking Status: never     Passive Smoke Exposure: no  Caffeine-Diet-Exercise     Caffeine use/day: 2     Does Patient Exercise: no  Hep-HIV-STD-Contraception     Dental Visit-last 6 months yes     Sun Exposure-Excessive: no  Safety-Violence-Falls     Seat Belt Use: yes     Firearms in the Home: no firearms in the  home     Smoke Detectors: yes     Violence in the Home: not applicable     Sexual Abuse: no  Current Medications (verified): 1)  Imodium A-D 2 Mg Tabs (Loperamide Hcl) .... Take 1 Tablet By Mouth Every Night 2)  Lipitor 20 Mg Tabs (Atorvastatin Calcium) .... Take 1 Tablet By Mouth Once A Day 3)  Adderall 10 Mg  Tabs (Amphetamine-Dextroamphetamine) .... Take 3 Per  Day  As Directed. 4)  Nexium 40 Mg  Cpdr (Esomeprazole Magnesium) .Marland Kitchen.. 1 By Mouth Two Times A Day 5)  Peranex Hc 3-1 %  Pads (Lidocaine-Hydrocortisone Ace) .... Apply To Rectum Two Times A Day As Directed 6)  Peranex Hc 2-2 % Kit (Lidocaine-Hydrocortisone Ace) .... Apply To Rectum As Needed 7)  Lasix 20 Mg Tabs (Furosemide) .Marland Kitchen.. 1 By Mouth Once Daily As Directed 8)  Januvia 100 Mg Tabs (Sitagliptin Phosphate) .Marland Kitchen.. 1 By Mouth Once Daily 9)  Alprazolam 0.25 Mg Tabs (Alprazolam) .Marland Kitchen.. 1-2 By Mouth Pre Flight 10)  Glimepiride 1 Mg Tabs (Glimepiride) .Marland Kitchen.. 1 By Mouth Once Daily 11)  Frova 2.5 Mg Tabs (Frovatriptan Succinate) .... Use As Directed  Allergies (verified): 1)  Codeine Phosphate (Codeine  Phosphate)  Past History:  Past medical, surgical, family and social histories (including risk factors) reviewed, and no changes noted (except as noted below).  Past Medical History: Reviewed history from 11/18/2009 and no changes required. Allergic rhinitis Anemia-NOS GERD had egd Hyperlipidemia add rectal prolapse  Brodie Headache [place] Colonic polyps, hx of  Kaplan fibroids Anal Fissure Hemorrhoids   Hx of chid abuse  DM dx 2011    Consults: Dr. Lina Sar Dr. Bjorn Pippin Dr. Susa Raring  Past Surgical History: Reviewed history from 09/17/2008 and no changes required. childbirth  G5P2 Hemorrhoidectomy 2007 rhinoplasty 1978   Tonsillectomy D &C X 4  Past History:  Care Management: Gastroenterology: [facility]  in the past for HA s Orthopedics: [facility]  Family  History: Reviewed history from 09/17/2008 and no changes required. adhd child clotting Family History of Alcoholism/Addiction Family History of Arthritis Family History Hypertension MOM renal Cancer Father Vascular disease Tobacco DM DM, Emotional  alcohol problems in the family Family History of Diabetes: Sisters, Father Family History of Kidney Disease: Cancer-Mother       Social History: Reviewed history from 09/17/2008 and no changes required. Married Never Smoked 2 children No   alcohol   61 yo and 52 yo  at home   Review of Systems  The patient denies anorexia, fever, weight loss, vision loss, decreased hearing, hoarseness, chest pain, syncope, dyspnea on exertion, peripheral edema, prolonged cough, headaches, hemoptysis, abdominal pain, melena, hematochezia, severe indigestion/heartburn, hematuria, incontinence, genital sores, muscle weakness, suspicious skin lesions, transient blindness, difficulty walking, unusual weight change, abnormal bleeding, enlarged lymph nodes, angioedema, and breast masses.         intermettent diarrhea  hx of fissue   Physical Exam  Genitalia:  Pelvic Exam:        External: normal female genitalia without lesions or masses        Vagina: normal without lesions or masses        Cervix: normal without lesions or masses        Adnexa: normal bimanual exam without masses or fullness        Uterus: normal by palpation        Pap smear: performed Physical Exam General Appearance: well developed, well nourished, no acute distress Eyes: conjunctiva and lids normal, PERRLA, EOMI, WNL Ears, Nose, Mouth, Throat: TM clear, nares clear, oral exam WNL Neck: supple, no lymphadenopathy, no thyromegaly, no JVD Respiratory: clear to auscultation and percussion, respiratory effort normal Cardiovascular: regular rate and rhythm, S1-S2, no murmur, rub or gallop, no bruits, peripheral pulses normal and symmetric, no cyanosis, clubbing, edema or  varicosities Chest: no scars, masses, tenderness; no asymmetry, skin changes, nipple discharge   Gastrointestinal: soft, non-tender; no hepatosplenomegaly, masses; active bowel sounds all quadrants, guaiac negative stool; no masses, tenderness, hemorrhoids tags noted   Genitourinary: no vaginal discharge, lesions; no masses or tenderness see above  Lymphatic: no cervical, axillary or inguinal adenopathy Musculoskeletal: gait normal, muscle tone and strength WNL, no joint swelling, effusions, discoloration, crepitus  Skin: clear, good turgor, color WNL, no rashes, lesions, or ulcerations Neurologic: normal mental status, normal reflexes, normal strength, sensation, and motion Psychiatric: alert; oriented to person, place and time   slighty anxious  Other Exam:  labs   noted .      Impression & Recommendations:  Problem # 1:  Preventive Health Care (ICD-V70.0) .Discussed nutrition,exercise,diet,healthy weight, vitamin D and calcium.   Problem # 2:  ATTENTION DEFICIT DISORDER, ADULT (ICD-314.00) ongoing taking  meds as needed   Problem # 3:  DIABETES MELLITUS (ICD-250.00) Assessment: Unchanged needs better control    needs to lose weight   wants to do   lifestyle intervention   consider low dose  metformin and or welcol  Her updated medication list for this problem includes:    Januvia 100 Mg Tabs (Sitagliptin phosphate) .Marland Kitchen... 1 by mouth once daily    Glimepiride 1 Mg Tabs (Glimepiride) .Marland Kitchen... 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 Use as directed  Problem # 8:  OBESITY (ICD-278.00) Assessment: Unchanged morbid contributing  Ht: 64 (02/28/2010)   Wt: 256 (02/28/2010)   BMI: 43.76 (11/18/2009)  Problem # 9:  ADJ DISORDER WITH MIXED ANXIETY & DEPRESSED MOOD (ICD-309.28) situation and ongoing .    not in current counseling because of financial  husband  doesnt think necessary.   Problem # 10:  GERD (ICD-530.81) ok to remain on two times a day for now.  losing weight will help problem  Her updated medication list for this problem includes:    Nexium 40 Mg Cpdr (Esomeprazole magnesium) .Marland Kitchen... 1 by mouth two times a day  Complete Medication List: 1)  Imodium A-d 2 Mg Tabs (Loperamide hcl) .... Take 1 tablet by mouth every night 2)  Lipitor 20 Mg Tabs (Atorvastatin calcium) .... Take 1 tablet by mouth once a day 3)  Adderall 10 Mg Tabs (Amphetamine-dextroamphetamine) .... Take 3 per  day  as directed. 4)  Nexium 40 Mg Cpdr (Esomeprazole magnesium) .Marland Kitchen.. 1 by mouth two times a day 5)  Peranex Hc 3-1 % Pads (Lidocaine-hydrocortisone ace) .... Apply to rectum two times a day as directed 6)  Peranex Hc 2-2 % Kit (Lidocaine-hydrocortisone ace) .... Apply to rectum as needed 7)  Lasix 20 Mg Tabs (Furosemide) .Marland Kitchen.. 1 by mouth once daily as directed 8)  Januvia 100 Mg Tabs (Sitagliptin phosphate) .Marland Kitchen.. 1 by mouth once daily 9)  Alprazolam 0.25 Mg Tabs (Alprazolam) .Marland Kitchen.. 1-2 by mouth pre flight 10)  Glimepiride 1 Mg Tabs (Glimepiride) .Marland Kitchen.. 1 by mouth once daily 11)  Frova 2.5 Mg Tabs (Frovatriptan succinate) .... Use as directed 12)  Welchol 3.75 Gm Pack (Colesevelam hcl) .Marland Kitchen.. 1 pack in water each day as directed 13)  Lexapro 10 Mg Tabs (Escitalopram oxalate) .... 1/2 to 1 by mouth once daily  or as directed  Patient  Instructions: 1)  start welchol every day to decrease your blood sugar.  2)  lose weight will help  your blood sugar   3)  continue other medications 4)  Can begin low dose lexapro  5 mg  and increase ot 10 . to  see if helps with mood and  hot flushes.    5)  rov in a month or as needed.  Prescriptions: FROVA 2.5 MG TABS (FROVATRIPTAN SUCCINATE) use as directed  #  6 x 0   Entered and Authorized by:   Madelin Headings MD   Signed by:   Madelin Headings MD on 02/28/2010   Method used:   Electronically to        [facility]       [address]       [place], [place]  [address]       Ph: [phone]       Fax: [phone]   RxID:   [id number] ADDERALL 10 MG  TABS (AMPHETAMINE-DEXTROAMPHETAMINE) take 3 per  day  as directed.  #90 x 0   Entered and Authorized by:   Madelin Headings MD   Signed by:   Madelin Headings MD on 02/28/2010   Method used:   Print then Give to Patient   RxID:   [id number] LEXAPRO 10 MG TABS (ESCITALOPRAM OXALATE) 1/2 to 1 by mouth once daily  or as directed  #30 x 1   Entered and Authorized by:   Madelin Headings MD   Signed by:   Madelin Headings MD on 02/28/2010   Method used:   Print then Give to Patient   RxID:   [id number] [employer] 3.75 GM PACK (COLESEVELAM HCL) 1 pack in water each day as directed  #30 x 3   Entered and Authorized by:   Madelin Headings MD   Signed by:   Madelin Headings MD on 02/28/2010   Method used:   Print then Give to Patient   RxID:   [id number]

## 2010-10-03 NOTE — Assessment & Plan Note (Signed)
Summary: follow up/ssc   Vital Signs:  Patient profile:   55 year old female Menstrual status:  perimenopausal Weight:      254 pounds Pulse rate:   80 / minute BP sitting:   120 / 80  (right arm) Cuff size:   large  Vitals Entered By: Romualdo Bolk, CMA (AAMA) (September 09, 2009 1:58 PM) CC: Follow-up visit- Pt wants to discuss shingles vaccine and ortho issues. CBG Result 139 LMP - Character: heavy Menarche (age onset years): 12 1/2   Menses interval (days): vary Menstrual flow (days): 3 weeks Menstrual Status perimenopausal Last PAP Result NEGATIVE FOR INTRAEPITHELIAL LESIONS OR MALIGNANCY.   History of Present Illness: Paige Hernandez comes in today for   for follow up of multiple medical problems . her last ov was in June 2010.  hasnt had follow up .   Last  sugar,   Dr Reino Kent last pm .   trying to cut out sugars but still drinking.    sweet tea at time s.   But has begun to walk. Still only eating one meal per day .    Sleep still disrupted.    New problem : Hyper flexed injury to right foot  on vacation  August .    in    had bruising   but still has stretched abnormal feeling.    then left hip and groin  pain but better now.  then right lower back  pain.    no new numbness.  tendinitis left arm old . New right knee pain when knees and  feels like sitting on a rock  lateral patella last for days.  and then hurt left shoulder brakelining.    in November  still tender and rom is less.  LIPIds NO change  ADD Not really taking med for this.    Preventive Screening-Counseling & Management  Alcohol-Tobacco     Alcohol drinks/day: 0     Smoking Status: never     Passive Smoke Exposure: no  Caffeine-Diet-Exercise     Caffeine use/day: 2     Does Patient Exercise: no  Current Medications (verified): 1)  Frova 2.5 Mg Tabs (Frovatriptan Succinate) .... Take 1 Tablet By Mouth As Directed 2)  Imodium A-D 2 Mg Tabs (Loperamide Hcl) .... Take 1 Tablet By Mouth Every Night 3)   Lipitor 20 Mg Tabs (Atorvastatin Calcium) .... Take 1 Tablet By Mouth Once A Day 4)  Adderall 10 Mg  Tabs (Amphetamine-Dextroamphetamine) .... Take 3 Per  Day  As Directed. 5)  Nexium 40 Mg  Cpdr (Esomeprazole Magnesium) .Marland Kitchen.. 1 By Mouth Two Times A Day 6)  Peranex Hc 3-1 %  Pads (Lidocaine-Hydrocortisone Ace) .... Apply To Rectum Two Times A Day As Directed 7)  Peranex Hc 2-2 % Kit (Lidocaine-Hydrocortisone Ace) .... Apply To Rectum As Needed 8)  Norco 5-325 Mg Tabs (Hydrocodone-Acetaminophen) .Marland Kitchen.. 1-1 By Mouth Q 4-6 Hours For Pain 9)  Lasix 20 Mg Tabs (Furosemide) .Marland Kitchen.. 1 By Mouth Once Daily As Directed 10)  Januvia 100 Mg Tabs (Sitagliptin Phosphate) .Marland Kitchen.. 1 By Mouth Once Daily 11)  Alprazolam 0.25 Mg Tabs (Alprazolam) .Marland Kitchen.. 1-2 By Mouth Pre Flight  Allergies (verified): 1)  Codeine Phosphate (Codeine Phosphate)  Past History:  Past medical, surgical, family and social histories (including risk factors) reviewed, and no changes noted (except as noted below).  Past Medical History: Reviewed history from 02/23/2009 and no changes required. Allergic rhinitis Anemia-NOS GERD had egd Hyperlipidemia add rectal  prolapse  Brodie Headache [place] Colonic polyps, hx of  Kaplan fibroids Anal Fissure Hemorrhoids   Hx of chikd abuse     Consults: Dr. Lina Sar Dr. Bjorn Pippin Dr. Susa Raring  Past Surgical History: Reviewed history from 09/17/2008 and no changes required. childbirth  G5P2 Hemorrhoidectomy 2007 rhinoplasty 1978   Tonsillectomy D &C X 4  Past History:  Care Management: Gastroenterology: [facility]  in the past for HA s Orthopedics: [facility]  Family History: Reviewed history from 09/17/2008 and no changes required. adhd child clotting Family History of Alcoholism/Addiction Family History of Arthritis Family History Hypertension MOM renal Cancer Father Vascular disease Tobacco DM DM, Emotional  alcohol problems  in the family Family History of Diabetes: Sisters, Father Family History of Kidney Disease: Cancer-Mother       Social History: Reviewed history from 09/17/2008 and no changes required. Married Never Smoked 2 children No   alcohol    Review of Systems  The patient denies anorexia, fever, weight loss, weight gain, vision loss, prolonged cough, abdominal pain, melena, hematochezia, severe indigestion/heartburn, difficulty walking, abnormal bleeding, enlarged lymph nodes, and angioedema.    Physical Exam  General:  Well-developed,well-nourished,in no acute distress; alert,appropriate and cooperative throughout examination Head:  normocephalic and atraumatic.   Eyes:  vision grossly intact.   Neck:  No deformities, masses, or tenderness noted. Lungs:  Normal respiratory effort, chest expands symmetrically. Lungs are clear to auscultation, no crackles or wheezes. Heart:  Normal rate and regular rhythm. S1 and S2 normal without gallop, murmur, click, rub or other extra sounds. Msk:  right foot  tender  mtp area  but no point tenderness  left shoulder decrease rom  elevation and rotation Pulses:  pulses intact without delay   Extremities:  trace left pedal edema and trace right pedal edema.   Neurologic:  alert & oriented X3, strength normal in all extremities, and sensation intact to light touch.   Skin:  turgor normal, color normal, and no petechiae.   Cervical Nodes:  No lymphadenopathy noted Psych:  Oriented X3, normally interactive, good eye contact, not anxious appearing, and not depressed appearing.  verbal    Impression & Recommendations:  Problem # 1:  IMPAIRED FASTING GLUCOSE (ICD-790.21) unable to take metformin cuase of gi se .   still using some sugar drinks  and eating one meal per day.  The following medications were removed from the medication list:    Metformin Hcl 500 Mg Tabs (Metformin hcl) .Marland Kitchen... 1 by mouth once daily and can increase to two times a day Her updated  medication list for this problem includes:    Januvia 100 Mg Tabs (Sitagliptin phosphate) .Marland Kitchen... 1 by mouth once daily  Orders: Fingerstick (36416) Glucose, (CBG) (82962) TLB-A1C / Hgb A1C (Glycohemoglobin) (83036-A1C) TLB-Hepatic/Liver Function Pnl (80076-HEPATIC) TLB-BMP (Basic Metabolic Panel-BMET) (80048-METABOL)  Problem # 2:  HYPERLIPIDEMIA (ICD-272.4)  Her updated medication list for this problem includes:    Lipitor 20 Mg Tabs (Atorvastatin calcium) .Marland Kitchen... Take 1 tablet by mouth once a day  Labs Reviewed: SGOT: 28 (02/08/2009)   SGPT: 40 (02/08/2009)  Lipid Goals: Chol Goal: 200 (07/17/2007)   HDL Goal: 40 (07/17/2007)   LDL Goal: 160 (07/17/2007)   TG Goal: 150 (07/17/2007)  Prior 10 Yr Risk Heart Disease: 4 % (11/11/2007)   HDL:49.90 (02/08/2009), 52.4 (03/17/2008)  LDL:84 (02/08/2009), 98 (03/17/2008)  Chol:152 (02/08/2009), 171 (03/17/2008)  Trig:90.0 (02/08/2009), 103 (03/17/2008)  Problem # 3:  FATTY LIVER DISEASE (ICD-571.8)  Orders: TLB-A1C /  Hgb A1C (Glycohemoglobin) (83036-A1C) TLB-Hepatic/Liver Function Pnl (80076-HEPATIC)  Problem # 4:  HERPES ZOSTER (ICD-053.9) hx of   disc shingles vaccine   when age 38    Problem # 5:  FOOT PAIN, RIGHT (ICD-729.5) Assessment: New  injury. continuing. seems like bad foot sprain fo fracture   Problem # 6:  ARM PAIN, LEFT (ICD-729.5) Assessment: New hx of strain or injury not resolved with decrease rom    see ortho   Complete Medication List: 1)  Frova 2.5 Mg Tabs (Frovatriptan succinate) .... Take 1 tablet by mouth as directed 2)  Imodium A-d 2 Mg Tabs (Loperamide hcl) .... Take 1 tablet by mouth every night 3)  Lipitor 20 Mg Tabs (Atorvastatin calcium) .... Take 1 tablet by mouth once a day 4)  Adderall 10 Mg Tabs (Amphetamine-dextroamphetamine) .... Take 3 per  day  as directed. 5)  Nexium 40 Mg Cpdr (Esomeprazole magnesium) .Marland Kitchen.. 1 by mouth two times a day 6)  Peranex Hc 3-1 % Pads (Lidocaine-hydrocortisone ace)  .... Apply to rectum two times a day as directed 7)  Peranex Hc 2-2 % Kit (Lidocaine-hydrocortisone ace) .... Apply to rectum as needed 8)  Norco 5-325 Mg Tabs (Hydrocodone-acetaminophen) .Marland Kitchen.. 1-1 by mouth q 4-6 hours for pain 9)  Lasix 20 Mg Tabs (Furosemide) .Marland Kitchen.. 1 by mouth once daily as directed 10)  Januvia 100 Mg Tabs (Sitagliptin phosphate) .Marland Kitchen.. 1 by mouth once daily 11)  Alprazolam 0.25 Mg Tabs (Alprazolam) .Marland Kitchen.. 1-2 by mouth pre flight  Other Orders: Admin 1st Vaccine ([address]) Flu Vaccine 31yrs + (60454)  Patient Instructions: 1)  no sugar drinks ! Save your money. 2)  Agree   with seeing ortho  about shoulder foot and knee 3)  May benefit from PT on shoulder and rec x ray of foot.  4)  Dr Regino Schultze or other.  5)  You will be informed of lab results when available.  6)  Then  will decide on follow up .   Flu Vaccine Consent Questions     Do you have a history of severe allergic reactions to this vaccine? no    Any prior history of allergic reactions to egg and/or gelatin? no    Do you have a sensitivity to the preservative Thimersol? no    Do you have a past history of Guillan-Barre Syndrome? no    Do you currently have an acute febrile illness? no    Have you ever had a severe reaction to latex? no    Vaccine information given and explained to patient? yes    Are you currently pregnant? no    Lot Number:[id number]   Exp Date:03/02/2010   Site Given  Left Deltoid IMbflu Romualdo Bolk, CMA (AAMA)  September 09, 2009 2:43 PM

## 2010-10-03 NOTE — Progress Notes (Signed)
Summary: pt needs lexapro changed before her ins will cover it  Phone Note From Pharmacy   Summary of Call: Ins needs a prior auth for lexapro. Pt needs to have tried generic or brand of the following: celexa, prozac, luvox, paxil or zoloft. Can we change her to one these medications? Initial call taken by: Romualdo Bolk, CMA (AAMA),  May 10, 2010 9:30 AM  Follow-up for Phone Call        would trial of  celexa 20 mg 1 by mouth once daily disp 30  refill x 1  rov in  1-2 months ( unless she already has appt then )  to see how this works. Follow-up by: Madelin Headings MD,  May 11, 2010 8:26 AM  Additional Follow-up for Phone Call Additional follow up Details #1::        Pt wants to try the celexa. Pt is also having some anxiety/ panic attacks. She states that she feels like the walls are closing in, hot and panic. Hopless, wants to excape from the hole and can't breathe. This happened while visiting friends. She is unsure if it could be a hormal element or thyroid. She would like something short term for this. She is getting on a plane in 2 weeks.  Additional Follow-up by: Romualdo Bolk, CMA Duncan Dull),  May 12, 2010 1:54 PM    Additional Follow-up for Phone Call Additional follow up Details #2::    ataivan .5 mg  1 by mouth two times a day as needed anxiety and panic   disp 20# no refills  to try for acute anxiety . we can discuss more at follow up  Follow-up by: Madelin Headings MD,  May 12, 2010 5:33 PM  Additional Follow-up for Phone Call Additional follow up Details #3:: Details for Additional Follow-up Action Taken: Pt aware and rx called in. Additional Follow-up by: Romualdo Bolk, CMA (AAMA),  May 15, 2010 1:21 PM  New/Updated Medications: CITALOPRAM HYDROBROMIDE 20 MG TABS (CITALOPRAM HYDROBROMIDE) 1 by mouth once daily ATIVAN 0.5 MG TABS (LORAZEPAM) 1 by mouth two times a day as needed Prescriptions: ATIVAN 0.5 MG TABS (LORAZEPAM) 1 by  mouth two times a day as needed  #20 x 0   Entered by:   Romualdo Bolk, CMA (AAMA)   Authorized by:   Madelin Headings MD   Signed by:   Romualdo Bolk, CMA (AAMA) on 05/15/2010   Method used:   Telephoned to ...       [facility] (retail)       [address]       [place], [place]  [address]       Ph: [phone]       Fax: [phone]   RxID:   [id number] CITALOPRAM HYDROBROMIDE 20 MG TABS (CITALOPRAM HYDROBROMIDE) 1 by mouth once daily  #30 x 1   Entered by:   Romualdo Bolk, CMA (AAMA)   Authorized by:   Madelin Headings MD   Signed by:   Romualdo Bolk, CMA (AAMA) on 05/12/2010   Method used:   Electronically to        [facility] (retail)       [address]       [place], [place]  [address]       Ph: [phone]  Fax: [phone]   RxID:   [id number]

## 2010-10-03 NOTE — Progress Notes (Signed)
Summary: refills  Phone Note From Pharmacy   Caller: [facility] Reason for Call: Needs renewal Details for Reason: Januvia and Lipitor  Summary of Call: Pt wants 90 days Initial call taken by: Romualdo Bolk, CMA Duncan Dull),  Jan 16, 2010 3:13 PM  Follow-up for Phone Call        Rx sent electronically Follow-up by: Romualdo Bolk, CMA (AAMA),  Jan 16, 2010 3:13 PM    Prescriptions: JANUVIA 100 MG TABS (SITAGLIPTIN PHOSPHATE) 1 by mouth once daily  #90 x 1   Entered by:   Romualdo Bolk, CMA (AAMA)   Authorized by:   Madelin Headings MD   Signed by:   Romualdo Bolk, CMA (AAMA) on 01/16/2010   Method used:   Electronically to        [facility] (retail)       [address]       [place], [place]  [address]       Ph: [phone]       Fax: [phone]   RxID:   [id number] LIPITOR 20 MG TABS (ATORVASTATIN CALCIUM) Take 1 tablet by mouth once a day  #90 x 0   Entered by:   Romualdo Bolk, CMA (AAMA)   Authorized by:   Madelin Headings MD   Signed by:   Romualdo Bolk, CMA (AAMA) on 01/16/2010   Method used:   Electronically to        [facility] (retail)       [address]       [place], [place]  [address]       Ph: [phone]       Fax: [phone]   RxID:   [id number]

## 2010-10-23 ENCOUNTER — Encounter: Payer: Self-pay | Admitting: Internal Medicine

## 2010-10-24 ENCOUNTER — Ambulatory Visit (INDEPENDENT_AMBULATORY_CARE_PROVIDER_SITE_OTHER): Payer: BC Managed Care – PPO | Admitting: Internal Medicine

## 2010-10-24 ENCOUNTER — Encounter: Payer: Self-pay | Admitting: Internal Medicine

## 2010-10-24 ENCOUNTER — Ambulatory Visit: Payer: Self-pay | Admitting: Internal Medicine

## 2010-10-24 DIAGNOSIS — K219 Gastro-esophageal reflux disease without esophagitis: Secondary | ICD-10-CM

## 2010-10-24 DIAGNOSIS — N951 Menopausal and female climacteric states: Secondary | ICD-10-CM

## 2010-10-24 DIAGNOSIS — E785 Hyperlipidemia, unspecified: Secondary | ICD-10-CM

## 2010-10-24 DIAGNOSIS — F4323 Adjustment disorder with mixed anxiety and depressed mood: Secondary | ICD-10-CM

## 2010-10-24 DIAGNOSIS — E669 Obesity, unspecified: Secondary | ICD-10-CM

## 2010-10-24 DIAGNOSIS — E119 Type 2 diabetes mellitus without complications: Secondary | ICD-10-CM

## 2010-10-24 LAB — CBC WITH DIFFERENTIAL/PLATELET
Basophils Absolute: 0 10*3/uL (ref 0.0–0.1)
Eosinophils Absolute: 0.2 10*3/uL (ref 0.0–0.7)
HCT: 43.8 % (ref 36.0–46.0)
Lymphocytes Relative: 21.9 % (ref 12.0–46.0)
Lymphs Abs: 1.7 10*3/uL (ref 0.7–4.0)
Monocytes Relative: 7 % (ref 3.0–12.0)
Platelets: 199 10*3/uL (ref 150.0–400.0)
RDW: 14.1 % (ref 11.5–14.6)

## 2010-10-24 MED ORDER — GLIMEPIRIDE 1 MG PO TABS: 1.0000 mg | ORAL_TABLET | Freq: Every day | ORAL | Status: DC

## 2010-10-24 MED ORDER — CITALOPRAM HYDROBROMIDE 20 MG PO TABS: ORAL_TABLET | ORAL | Status: DC

## 2010-10-24 MED ORDER — COLESEVELAM HCL 625 MG PO TABS: 1875.0000 mg | ORAL_TABLET | Freq: Two times a day (BID) | ORAL | Status: DC

## 2010-10-24 MED ORDER — ATORVASTATIN CALCIUM 20 MG PO TABS: 20.0000 mg | ORAL_TABLET | Freq: Every day | ORAL | Status: DC

## 2010-10-24 MED ORDER — SITAGLIPTIN PHOSPHATE 100 MG PO TABS: 100.0000 mg | ORAL_TABLET | Freq: Every day | ORAL | Status: DC
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
## 2010-10-24 NOTE — Patient Instructions (Signed)
Change to tabs of welchol,  Increase  To 30 of celexa  Take  nexium 30  Minutes or so before a meal to help effectiveness. Lose weight and exercise as you are doing will help the reflux.

## 2010-10-25 LAB — LIPID PANEL
Cholesterol: 172 mg/dL (ref 0–200)
HDL: 59.5 mg/dL (ref >39.00)
LDL Cholesterol: 87 mg/dL (ref 0–99)
Total CHOL/HDL Ratio: 3
Triglycerides: 130 mg/dL (ref 0.0–149.0)
VLDL: 26 mg/dL (ref 0.0–40.0)

## 2010-10-25 LAB — BASIC METABOLIC PANEL
BUN: 19 mg/dL (ref 6–23)
CO2: 26 mEq/L (ref 19–32)
Chloride: 105 mEq/L (ref 96–112)
Creatinine, Ser: 0.9 mg/dL (ref 0.4–1.2)
Glucose, Bld: 197 mg/dL — ABNORMAL HIGH (ref 70–99)

## 2010-10-25 LAB — HEPATIC FUNCTION PANEL
AST: 36 U/L (ref 0–37)
Albumin: 3.9 g/dL (ref 3.5–5.2)
Alkaline Phosphatase: 97 U/L (ref 39–117)
Total Protein: 6.7 g/dL (ref 6.0–8.3)

## 2010-10-29 ENCOUNTER — Encounter: Payer: Self-pay | Admitting: Internal Medicine

## 2010-10-29 NOTE — Assessment & Plan Note (Signed)
Try welchol tabs   Hard to comply with  Liquid.  Beginning to exercise and take charge  But still metabolically  Abnormal.  dermatitis

## 2010-10-29 NOTE — Assessment & Plan Note (Signed)
Some waning of  celexa effect . Ok to increase to 30 mg .  Continue lsi

## 2010-10-29 NOTE — Assessment & Plan Note (Addendum)
Counseled.   About break through sx Take med pre meal to ensure max efficacy Continue   Weight loss efforts.

## 2010-10-29 NOTE — Assessment & Plan Note (Signed)
Improved response with celexa

## 2010-11-01 ENCOUNTER — Telehealth: Payer: Self-pay | Admitting: *Deleted

## 2010-11-01 DIAGNOSIS — E119 Type 2 diabetes mellitus without complications: Secondary | ICD-10-CM

## 2010-11-01 NOTE — Telephone Encounter (Signed)
Message copied by Tor Netters on Wed Nov 01, 2010  2:20 PM ------      Message from: [facility] [place] [facility]      Created: Wed Nov 01, 2010  1:40 PM       Tell patient that her hg a1c is more elevated  7.8  . Take the welchol        ercheck the Hg a1c in 3 months and return office visit   Report the other results to her also.

## 2010-11-01 NOTE — Telephone Encounter (Signed)
Left message on machine about results. 

## 2011-01-16 NOTE — Letter (Signed)
March 25, 2007    Paige Hernandez. Fabian Sharp, MD  [address] [place], [place]  [address]   RE:  LIAM, BOSSMAN  MRN:  [id number]  /  DOB:  [birth year]   Dear Dr. Fabian Sharp,   As you are aware Paige Hernandez is a 55 year old, overweight Caucasian woman  referred for evaluation of sleep apnea. She describes loud snoring that  has been noted by her husband and children, with episodes of choking and  gasping that sometimes wake her up from sleep. Her Epworth sleepiness  score is 11 out of 24, and she describes excessive sleepiness when she  is sitting inactive in a public place, or lying down to rest in the  afternoon when circumstances permit.   Her main complaint seems to be that she has trouble falling asleep at  least 3 to 4 nights a week. Her usual bedtime is variable from 11:00  p.m. to 1:00 a.m. About 2 to 3 nights a week she will lay awake in bed  for 1 to 4 hours before falling asleep. She wakes up 4 to 5 times during  the night. She generally sleeps on the left side due to pain on the  right TM joint. She gets out of bed around 7:00 a.m. feeling tired and  sleepy. On weekends, she will stay in bed sometimes up to 10:00 a.m. She  denies recent weight gain. She denies day time naps because housework  and her children keep her busy. She states that if she had the time she  could definitely sleep. There is no history of sleep paralysis,  cataplexia, or parasomnia.   PAST MEDICAL HISTORY:  Hypertension, hyperlipidemia, chronic migraine  headaches which have improved since menopause. As includes GERD and  irritable bowel syndrome.   PAST SURGICAL HISTORY:  Include; colonoscopy, hemorrhoidectomy, nasal  reconstruction in 1978, tonsillectomy adenoidectomy  1977.   ALLERGIES:  None.   CURRENT MEDICATIONS:  Include;  1. Nexium 40 mg daily.  2. Lipitor 20 mg daily.  3. Peranex rectal cream.  4. Imodium p.r.n.  5. Adderall 10 mg p.r.n. (for ADD).  6. Frova p.r.n. for headaches.   SOCIAL  HISTORY:  Never smoker, denies alcohol use. She is married and  lives with her children ages 51 and 49.   FAMILY HISTORY:  Sister has a clotting disorder, mother had renal cell  carcinoma.   REVIEW OF SYSTEMS:  Describes sleepiness during the day, does not have  problems with driving. Reports occasional teeth grinding and pain in the  right TM joint. Occasional sadness of mood and indigestion.   PHYSICAL EXAMINATION:  Weight 249 pounds, afebrile, blood pressure  110/86, heart rate 86, RR 14  oxygen saturation 97% on room air.  HEENT: Normal pharyngeal space, class 2 airway.  NECK: Supple. No JVD. No lymphadenopathy.  CVS: S1, S2 normal.  CHEST: Clear to auscultation.  ABDOMEN: Soft, nontender.  NEUROLOGICALLY: Nonfocal.  EXTREMITIES: No edema.   IMPRESSION AND PLAN:  1. Given obesity, daytime somnolence, and episodes of gasping and      choking during sleep, obstructive sleep apnea is probable, and an      overnight polysomnogram will be scheduled. The pathophysiology of      sleep apnea and its  cardiovascular consequences and modes of      treatment including CPAP were discussed with the patient in great      detail.  2. I do note that her key issue her is sleep onset insomnia  and some      trouble with sleep maintenance. I wonder if some of her chronic      fatigue is also due to this. She seems to      have been diagnosed with attention deficit disorder and has been      placed on Adderall, and I am not sure if this is contributing. She      states that she does not take the Adderall as often. A differential      diagnosis of her insomnia is psychophysiologic and depression. We      will pursue this on future visits and keep you informed as to her      progress.    Sincerely,      Paige Milch, MD  Electronically Signed    RVA/MedQ  DD: 03/25/2007  DT: 03/26/2007  Job #: [id number]

## 2011-01-19 NOTE — Assessment & Plan Note (Signed)
[facility]                         GASTROENTEROLOGY OFFICE NOTE   Paige Hernandez, Paige Hernandez                        MRN:          [id number]  DATE:09/10/2006                            DOB:          November 17, 1955    Paige Hernandez is a 55 year old white female with anal fissure and  gastroesophageal reflux disease.  She was evaluated on July 16, 2006, with colonoscopy confirming presence of anal fissure.  She has  been on quite an intense regimen of topical steroids , Anusol HC  suppository, and antispasmodic Robinul Forte for diarrhea due to spastic  colon.  She is about 50-75% improved in that the rectal bleeding has  increased and the rectal pain also has been reduced significantly,  although she is not completely well.  Her problem is diarrhea, having  five to six bowel movements a day for which she is taking Imodium and  Robinul Forte.  She is on Topamax which may be contributing to her  diarrhea.  She still has her gallbladder.   As far as the gastroesophageal reflux disease is concerned, she was put  on Effexor XR 300 mg a day which caused more reflux, but since then she  has switched to Cymbalta 60 mg daily and the reflux has gone away.  She  is on Nexium 40 mg daily with complete relief of her reflux symptoms.   PHYSICAL EXAMINATION:  VITAL SIGNS:  Blood pressure 122/82, pulse 68,  weight 231 pounds.  GENERAL:  She was alert, oriented, no distress.  ABDOMEN:  Exam was unremarkable.  RECTAL:  Exam shows a palpable anal fissure at 9 o'clock which has  healed up.  There is no opening, no bleeding.  Stool is trace hemoccult  positive.   IMPRESSION:  1. Healing anal fissure.  It is about 75% healed.  2. Diarrhea, probably a combination of irritable bowel syndrome and      Topamax sideeffect  3. Gastroesophageal reflux, currently under good control.   PLAN:  1. Continue current regimen.  2. Add Questran 4 g daily.  3. Consider decrease Topamax to  75 mg daily.     Hedwig Morton. Juanda Chance, MD  Electronically Signed    DMB/MedQ  DD: 09/10/2006  DT: 09/10/2006  Job #: [id number]   cc:   Neta Mends. Fabian Sharp, MD

## 2011-01-19 NOTE — Op Note (Signed)
NAME:  Paige Hernandez, Paige Hernandez                 ACCOUNT NO.:  [id number]   MEDICAL RECORD NO.:  [id number]          PATIENT TYPE:  AMB   LOCATION:  DAY                          FACILITY:  [facility]   PHYSICIAN:  Vikki Ports, MDDATE OF BIRTH:  1955-09-09   DATE OF PROCEDURE:  07/25/2005  DATE OF DISCHARGE:                                 OPERATIVE REPORT   .   PREOPERATIVE DIAGNOSIS:  Internal hemorrhoids with prolapse.   POSTOPERATIVE DIAGNOSIS:  Internal hemorrhoids with prolapse.   PROCEDURE:  PPH hemorrhoidectomy, rectopexy.   SURGEON:  Vikki Ports, M.D.\   ANESTHESIA:  General.   DESCRIPTION OF PROCEDURE:  The patient was taken operating room, placed in  supine position.  After adequate anesthesia was induced using endotracheal  tube, the patient was placed in the prone jack-knife position.  Perianal and  rectal prep were undertaken.  Anal dilatation was accomplished to three  fingerbreadths.  Internal hemorrhoidal bundles were injected with 0.5%  Marcaine with Wydase.  Internal and external sphincter muscles were injected  with 0.5% Marcaine with epinephrine.  A 2-0 Prolene pursestring suture was  placed in the mucosa in the submucosal fashion 5 cm proximal to the dentate  line.  A PPH stapler was then placed proximal to the pursestring suture and  clamped down, held in place for 1 minute.  The vagina was checked, and the  stapler was fired.  It was removed and a good 1 cm ring of hemorrhoidal  tissue was inspected.  No muscularis was seen.  Staple line was inspected,  and there was no bleeding seen.  Gelfoam packing was placed.  The patient  the patient tolerated the procedure well and went to PACU in good condition.      Vikki Ports, MD  Electronically Signed     KRH/MEDQ  D:  07/25/2005  T:  07/26/2005  Job:  [id number]

## 2011-01-19 NOTE — Assessment & Plan Note (Signed)
[facility]                           GASTROENTEROLOGY OFFICE NOTE   Paige Hernandez, Paige Hernandez                        MRN:          [id number]  DATE:07/15/2006                            DOB:          March 14, 1956    HISTORY OF PRESENT ILLNESS:  Mrs. Paige Hernandez is a 55 year old white female with  chronic diarrhea, history of irritable bowel syndrome/predominant diarrhea,  hemorrhoids which were treated surgically by Dr. Luan Hernandez about a year ago  by [facility] stapling procedure and has a history of new onset anal fissure of  about 10 months duration since the [facility] treatment which has been extremely  painful.  She is a former patient of Dr. Oscar Hernandez.  She was then switched to  Dr. Ewing Hernandez and now is seeing me.  She describes incontinent loose stools  around 4 in the morning.  Last colonoscopy three years ago showed  adenomatous polyp of the colon as well as hemorrhoids.  Her medications  including Topamax 100 mg daily, Effexor, Nexium for gastroesophageal reflux  40 mg daily.  She denies any abdominal pain. She is not employed.  Denies  particular stress.  She has been treated by Dr. Ewing Hernandez with Parinex with 2%  hydrocortisone applicator 2-3 times a day with marked improvement of the  pain.  She currently has only incontinence and rectal irritation.  She is  not taking antispasmodics or Imodium.  Past history was significant for high  blood pressure, high cholesterol, anxiety, panic disorders, chronic  headaches for which she takes Topamax.   OPERATION:  1. Tonsillectomy.  2. Hemorrhoidectomy.   FAMILY HISTORY:  Positive for kidney cancer in mother, diabetes in father,  alcohol in parents.   SOCIAL HISTORY:  Married with two children.  Bachelor's degree in business  administration.  She does drink only occasionally and does not smoke.   REVIEW OF SYSTEMS:  Positive for fever, eye glasses, swelling of her feet,  frequent cough, urination changes, joint pain, sleeping  problems, anemia,  excessive thirst, night sweats, vision changes, pain with periods, back  pain, new anxiety, excessive urination, severe hearing problems, leakage of  urine, musculoskeletal pain, confusion, new depression and swollen lymph  glands.   PHYSICAL EXAMINATION:  VITAL SIGNS:  Blood pressure 114/64, pulse 64, weight  225 pounds.  GENERAL:  She was alert, oriented, in no distress.  Clearly overweight.  LUNGS:  Clear to auscultation.  COR:  Normal S1, S2.  ABDOMEN:  Obese, soft, nontender with normoactive bowel sounds.  RECTAL:  Anoscopic exam shows large fibrous appearing skin tags which were  thick, sessile, nontender, at least three of them circumferential around the  anal canal.  Rectal tone actually appeared rather  normal, possibly slightly  decreased.  Anal fissure at 9 o'clock, about 5 mm long, about 2 mm wide, not  particularly tender.  Some prolapse of f the rectal mucosa into the anal  canal, one small hemorrhoid.  No other hemorrhoids present.  Stool was heme  negative.   IMPRESSION:  21. A 55 year old white female with diarrhea, history of irritable bowel  syndrome (IBS), rule out collagenous colitis, microscopic colitis.  2. Status post treatment of prolapsed hemorrhoids with procedure for      prolapse and hemorrhoids (PPH).  3. New anal fissure, now slowly responding to topical steroids.   PLAN:  1. The patient needs another colonoscopy because of adenomatous polyps as      well as to rule out collagenous colitis.  2. Imodium 2 mg q.h.s.  If she does not respond to it, add Robinul Forte 2      mg q.a.m.  3. Continue Pyrinex applications daily.  4. Anusol HC suppositories x12.     Paige Hernandez. Paige Chance, MD  Electronically Signed    Paige Hernandez  DD: 07/15/2006  DT: 07/15/2006  Job #: [id number]   cc:   Paige Hernandez. Paige Sharp, MD

## 2011-03-03 ENCOUNTER — Other Ambulatory Visit: Payer: Self-pay | Admitting: Internal Medicine

## 2011-03-19 ENCOUNTER — Telehealth: Payer: Self-pay | Admitting: Internal Medicine

## 2011-03-19 NOTE — Telephone Encounter (Signed)
Pts insurance will run out on Aug 31st. 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 Need work in cpxs and wcc before end of august. Pls advise.

## 2011-03-19 NOTE — Telephone Encounter (Signed)
Faires on Aug 13th 1:45pm Ayris 8/29 at 10:15am Renae Fickle on 8/31 at 10am  Pt aware of these appts.

## 2011-04-19 ENCOUNTER — Other Ambulatory Visit (INDEPENDENT_AMBULATORY_CARE_PROVIDER_SITE_OTHER): Payer: BC Managed Care – PPO

## 2011-04-19 DIAGNOSIS — E119 Type 2 diabetes mellitus without complications: Secondary | ICD-10-CM

## 2011-04-19 DIAGNOSIS — Z0000 Encounter for general adult medical examination without abnormal findings: Secondary | ICD-10-CM

## 2011-04-19 LAB — CBC WITH DIFFERENTIAL/PLATELET
Basophils Absolute: 0 10*3/uL (ref 0.0–0.1)
HCT: 38.9 % (ref 36.0–46.0)
Lymphs Abs: 1.9 10*3/uL (ref 0.7–4.0)
Monocytes Relative: 6.9 % (ref 3.0–12.0)
Neutrophils Relative %: 62.5 % (ref 43.0–77.0)
Platelets: 172 10*3/uL (ref 150.0–400.0)
RDW: 15.6 % — ABNORMAL HIGH (ref 11.5–14.6)
WBC: 6.9 10*3/uL (ref 4.5–10.5)

## 2011-04-19 LAB — TSH: TSH: 2.59 u[IU]/mL (ref 0.35–5.50)

## 2011-04-19 LAB — LIPID PANEL
Cholesterol: 157 mg/dL (ref 0–200)
LDL Cholesterol: 73 mg/dL (ref 0–99)

## 2011-04-19 LAB — HEPATIC FUNCTION PANEL
AST: 55 U/L — ABNORMAL HIGH (ref 0–37)
Bilirubin, Direct: 0 mg/dL (ref 0.0–0.3)
Total Bilirubin: 0.4 mg/dL (ref 0.3–1.2)

## 2011-04-19 LAB — BASIC METABOLIC PANEL
BUN: 12 mg/dL (ref 6–23)
Calcium: 8.4 mg/dL (ref 8.4–10.5)
Creatinine, Ser: 0.9 mg/dL (ref 0.4–1.2)
GFR: 68.66 mL/min (ref >60.00)
Glucose, Bld: 251 mg/dL — ABNORMAL HIGH (ref 70–99)
Potassium: 4.2 mEq/L (ref 3.5–5.1)

## 2011-04-20 ENCOUNTER — Other Ambulatory Visit: Payer: BC Managed Care – PPO

## 2011-04-20 LAB — POCT URINALYSIS DIPSTICK
Bilirubin, UA: NEGATIVE
Ketones, UA: NEGATIVE
Nitrite, UA: NEGATIVE
Protein, UA: NEGATIVE
Spec Grav, UA: 1.02
Urobilinogen, UA: 0.2
pH, UA: 5

## 2011-04-20 LAB — MICROALBUMIN / CREATININE URINE RATIO
Creatinine,U: 121.2 mg/dL
Microalb Creat Ratio: 1.1 mg/g (ref 0.0–30.0)
Microalb, Ur: 1.3 mg/dL (ref 0.0–1.9)

## 2011-05-02 ENCOUNTER — Ambulatory Visit (INDEPENDENT_AMBULATORY_CARE_PROVIDER_SITE_OTHER): Payer: BC Managed Care – PPO | Admitting: Internal Medicine

## 2011-05-02 ENCOUNTER — Encounter: Payer: Self-pay | Admitting: Internal Medicine

## 2011-05-02 VITALS — BP 110/80 | HR 72 | Ht 64.0 in | Wt 262.0 lb

## 2011-05-02 DIAGNOSIS — E119 Type 2 diabetes mellitus without complications: Secondary | ICD-10-CM

## 2011-05-02 DIAGNOSIS — Z0000 Encounter for general adult medical examination without abnormal findings: Secondary | ICD-10-CM

## 2011-05-02 DIAGNOSIS — R51 Headache: Secondary | ICD-10-CM

## 2011-05-02 DIAGNOSIS — R350 Frequency of micturition: Secondary | ICD-10-CM

## 2011-05-02 DIAGNOSIS — E785 Hyperlipidemia, unspecified: Secondary | ICD-10-CM

## 2011-05-02 DIAGNOSIS — K219 Gastro-esophageal reflux disease without esophagitis: Secondary | ICD-10-CM

## 2011-05-02 DIAGNOSIS — E669 Obesity, unspecified: Secondary | ICD-10-CM

## 2011-05-02 DIAGNOSIS — K7689 Other specified diseases of liver: Secondary | ICD-10-CM

## 2011-05-02 DIAGNOSIS — F988 Other specified behavioral and emotional disorders with onset usually occurring in childhood and adolescence: Secondary | ICD-10-CM

## 2011-05-02 LAB — POCT URINALYSIS DIPSTICK
Blood, UA: NEGATIVE
Protein, UA: NEGATIVE
Spec Grav, UA: 1.02
pH, UA: 5

## 2011-05-02 LAB — GLUCOSE, POCT (MANUAL RESULT ENTRY): POC Glucose: 352

## 2011-05-02 MED ORDER — GLIMEPIRIDE 1 MG PO TABS: ORAL_TABLET | ORAL | Status: DC

## 2011-05-02 MED ORDER — FROVATRIPTAN SUCCINATE 2.5 MG PO TABS: 2.5000 mg | ORAL_TABLET | ORAL | Status: DC | PRN

## 2011-05-02 MED ORDER — AMPHETAMINE-DEXTROAMPHETAMINE 10 MG PO TABS: 30.0000 mg | ORAL_TABLET | Freq: Every day | ORAL | Status: DC

## 2011-05-02 MED ORDER — LIRAGLUTIDE 18 MG/3ML ~~LOC~~ SOLN: SUBCUTANEOUS | Status: DC

## 2011-05-02 NOTE — Progress Notes (Signed)
Subjective:    Patient ID: Paige Hernandez, female    DOB: Sep 27, 1955, 55 y.o.   MRN: [id number]  HPI Patient comes in today for preventive visit and follow-up of medical issues. Update of her history since her last visit. Since last visit  No hospitalizations ed visits or fractures. DM  Increase urination for 3 weeks and bowel urgency .  Had eye exam and was ok  LIPID:    No se of meds  MOOD:  No change    Still stressed   BP: no problem with meds or readings HAs    Hormonal has had been good now getting them back not as bad .   About 3 per months.  Triggers  Stress .   Sleep  Got worse when daughter back to school. In past week.   Some snoring  An hx of wakening.  Ortho left rotator  Cuff  ADD:   hasnt used meds for a while but would like to have some  In meantime . Helps at times   Review of Systems ROS:  GEN/ HEENTNo fever, significant weight changes sweats   Some hot flushes. headaches vision problems hearing changes, CV/ PULM; No chest pain shortness of breath cough, syncope,edema  change in exercise tolerance. GI /GU: No adominal pain, vomiting, change in bowel habits. No blood in the stool. No significant GU symptoms. SKIN/HEME: ,no acute skin rashes suspicious lesions or bleeding. No lymphadenopathy, nodules, masses.  NEURO/ PSYCH:  No neurologic signs such as weakness numbness No depression anxiety. IMM/ Allergy: No unusual infections.  Allergy .   REST of 12 system review negative  Past history family history social history reviewed in the electronic medical record.      Objective:   Physical Exam  Wt Readings from Last 3 Encounters:  05/02/11 262 lb (118.842 kg)  10/24/10 266 lb (120.657 kg)  02/28/10 256 lb (116.121 kg)    Physical Exam: Vital signs reviewed GEX:BMWU is a well-developed well-nourished alert cooperative  white female who appears her stated age in no acute distress.  HEENT: normocephalic  atraumatic , Eyes: PERRL EOM's full, conjunctiva clear,  Nares: paten,t no deformity discharge or tenderness., Ears: no deformity EAC's clear TMs with normal landmarks. Mouth: clear OP, no lesions, edema.  Moist mucous membranes. Dentition in adequate repair. NECK: supple without masses, thyromegaly or bruits. CHEST/PULM:  Clear to auscultation and percussion breath sounds equal no wheeze , rales or rhonchi. No chest wall deformities or tenderness. CV: PMI is nondisplaced, S1 S2 no gallops, murmurs, rubs. Peripheral pulses are full without delay.No JVD .  Breast: normal by inspection . No dimpling, discharge, masses, tenderness or discharge . LN: no cervical axillary inguinal adenopathy ABDOMEN: Bowel sounds normal nontender  No guard or rebound, no hepato splenomegal no CVA tenderness.  No hernia. Extremtities:  No clubbing cyanosis or edema, no acute joint swelling or redness no focal atrophy NEURO:  Oriented x3, cranial nerves 3-12 appear to be intact, no obvious focal weakness,gait within normal limits no abnormal reflexes or asymmetrical SKIN: No acute rashes normal turgor, color, no bruising or petechiae. PSYCH: Oriented, good eye contact, no obvious depression anxiety, cognition and judgment appear normal.  Labs reviewed with patient.  Lab Results  Component Value Date   HGBA1C 9.1* 04/19/2011       Assessment & Plan:  Preventive Health Care Counseled regarding healthy nutrition, exercise, sleep, injury prevention, calcium vit d and healthy weight . Up to date  on healthcare parameters  per hx  But getting mammogram   DM  Out of control and  Poss cause of her urinary sx .    Prolonged counseling  She has cut down on sugars but needs to absolutely dc. Dc januvia  and add victoza but may need insulin . Sample rx and into pack given  Urinary sx  R/o infection. Ht no change Mood stable GERD stable  ADHD   Ok to give med as needed  HAs  ? If dm causing flare  Ok to refill meds as helpd in past. Abnormal lfts presumed fatty liver LIPIDS:  pretty good on meds  Morbid obesity

## 2011-05-02 NOTE — Patient Instructions (Addendum)
Your diabetes     Is now out of control.  Start victoza. Stop the Januvia No sugar drinks . Try to take welchol at full dose. Increase amaryl to 2 mg per day  If needed.  We may need to go on insulin.  Consider seeing   Endocrinologist . Will do check for urine infection. Call you with results Monitor your headaches. Ok to  Use add.    med s needed.   Recheck in one month or as needed

## 2011-05-05 LAB — URINE CULTURE

## 2011-05-08 ENCOUNTER — Telehealth: Payer: Self-pay | Admitting: *Deleted

## 2011-05-08 MED ORDER — SULFAMETHOXAZOLE-TRIMETHOPRIM 800-160 MG PO TABS: 1.0000 | ORAL_TABLET | Freq: Two times a day (BID) | ORAL | Status: AC

## 2011-05-08 NOTE — Telephone Encounter (Signed)
Message copied by Romualdo Bolk on Tue May 08, 2011  1:29 PM ------      Message from: [facility], [place] [facility]      Created: Mon May 07, 2011  4:44 PM       Tell patient she does have a uti .            Call in Septra DS 1 po bid for 6  days  Disp 12 .      Keep her follow up appt.

## 2011-05-08 NOTE — Telephone Encounter (Signed)
Left message on machine about results. Rx called in.

## 2011-05-11 ENCOUNTER — Telehealth: Payer: Self-pay | Admitting: *Deleted

## 2011-05-11 MED ORDER — BAYER MICROLET LANCETS MISC: Status: AC

## 2011-05-11 NOTE — Telephone Encounter (Signed)
Pt needs bayer contour lancets sent to pharmacy. Rx sent to pharmacy.

## 2011-06-04 ENCOUNTER — Telehealth: Payer: Self-pay | Admitting: Internal Medicine

## 2011-06-04 NOTE — Telephone Encounter (Signed)
Pt needs samples of novo twist needle 32 gauge tips

## 2011-06-04 NOTE — Telephone Encounter (Signed)
Pt came in office and said that if samples are not avail for novo twist 32 gauge tips, then she would need to get a written script or call in to [facility] on [address].

## 2011-06-05 MED ORDER — INSULIN PEN NEEDLE 32G X 5 MM MISC: Status: DC

## 2011-06-05 NOTE — Telephone Encounter (Signed)
rx sent to pharmacy

## 2011-06-12 ENCOUNTER — Ambulatory Visit: Payer: BC Managed Care – PPO | Admitting: Internal Medicine

## 2011-06-29 ENCOUNTER — Ambulatory Visit: Payer: BC Managed Care – PPO | Admitting: Internal Medicine

## 2011-07-18 ENCOUNTER — Other Ambulatory Visit: Payer: Self-pay | Admitting: Internal Medicine

## 2011-07-19 ENCOUNTER — Ambulatory Visit: Payer: BC Managed Care – PPO | Admitting: Internal Medicine

## 2011-08-16 ENCOUNTER — Ambulatory Visit: Payer: BC Managed Care – PPO | Admitting: Internal Medicine

## 2011-08-18 ENCOUNTER — Other Ambulatory Visit: Payer: Self-pay | Admitting: Internal Medicine

## 2011-09-25 ENCOUNTER — Ambulatory Visit (INDEPENDENT_AMBULATORY_CARE_PROVIDER_SITE_OTHER): Payer: BC Managed Care – PPO | Admitting: Family

## 2011-09-25 ENCOUNTER — Encounter: Payer: Self-pay | Admitting: Family

## 2011-09-25 VITALS — BP 112/76 | Temp 97.5°F | Wt 250.0 lb

## 2011-09-25 DIAGNOSIS — E119 Type 2 diabetes mellitus without complications: Secondary | ICD-10-CM

## 2011-09-25 DIAGNOSIS — R350 Frequency of micturition: Secondary | ICD-10-CM

## 2011-09-25 LAB — LIPID PANEL: Total CHOL/HDL Ratio: 4

## 2011-09-25 LAB — BASIC METABOLIC PANEL
CO2: 24 mEq/L (ref 19–32)
Calcium: 8.7 mg/dL (ref 8.4–10.5)
GFR: 63.63 mL/min (ref >60.00)
Sodium: 137 mEq/L (ref 135–145)

## 2011-09-25 LAB — GLUCOSE, POCT (MANUAL RESULT ENTRY): POC Glucose: 333

## 2011-09-25 LAB — POCT URINALYSIS DIPSTICK
Nitrite, UA: NEGATIVE
Spec Grav, UA: 1.025
Urobilinogen, UA: 0.2

## 2011-09-25 LAB — HEMOGLOBIN A1C: Hgb A1c MFr Bld: 10.6 % — ABNORMAL HIGH (ref 4.6–6.5)

## 2011-09-25 LAB — MICROALBUMIN / CREATININE URINE RATIO
Creatinine,U: 106.9 mg/dL
Microalb, Ur: 1.4 mg/dL (ref 0.0–1.9)
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
## 2011-09-25 NOTE — Patient Instructions (Signed)
Diabetes and Foot Care Diabetes may cause you to have a poor blood supply (circulation) to your legs and feet. Because of this, the skin may be thinner, break easier, and heal more slowly. You also may have nerve damage in your legs and feet causing decreased feeling. You may not notice minor injuries to your feet that could lead to serious problems or infections. Taking care of your feet is one of the most important things you can do for yourself.  HOME CARE INSTRUCTIONS  Do not go barefoot. Bare feet are easily injured.   Check your feet daily for blisters, cuts, and redness.   Wash your feet with warm water (not hot) and mild soap. Pat your feet and between your toes until completely dry.   Apply a moisturizing lotion that does not contain alcohol or petroleum jelly to the dry skin on your feet and to dry brittle toenails. Do not put it between your toes.   Trim your toenails straight across. Do not dig under them or around the cuticle.   Do not cut corns or calluses, or try to remove them with medicine.   Wear clean cotton socks or stockings every day. Make sure they are not too tight. Do not wear knee high stockings since they may decrease blood flow to your legs.   Wear leather shoes that fit properly and have enough cushioning. To break in new shoes, wear them just a few hours a day to avoid injuring your feet.   Wear shoes at all times, even in the house.     Diabetes, Frequently Asked Questions WHAT IS DIABETES? Most of the food we eat is turned into glucose (sugar). Our bodies use it for energy. The pancreas makes a hormone called insulin. It helps glucose get into the cells of our bodies. When you have diabetes, your body either does not make enough insulin or cannot use its own insulin as well as it should. This causes sugars to build up in your blood. WHAT ARE THE SYMPTOMS OF DIABETES?  Frequent urination.   Excessive thirst.   Unexplained weight loss.   Extreme  hunger.   Blurred vision.   Tingling or numbness in hands or feet.   Feeling very tired much of the time.   Dry, itchy skin.   Sores that are slow to heal.   Yeast infections.  WHAT ARE THE TYPES OF DIABETES? Type 1 Diabetes   About 10% of affected people have this type.   Usually occurs before the age of 52.   Usually occurs in thin to normal weight people.  Type 2 Diabetes  About 90% of affected people have this type.   Usually occurs after the age of 91.   Usually occurs in overweight people.   More likely to have:   A family history of diabetes.   A history of diabetes during pregnancy (gestational diabetes).   High blood pressure.   High cholesterol and triglycerides.  Gestational Diabetes  Occurs in about 4% of pregnancies.   Usually goes away after the baby is born.   More likely to occur in women with:   Family history of diabetes.   Previous gestational diabetes.   Obese.   Over 69 years old.  WHAT IS PRE-DIABETES? Pre-diabetes means your blood glucose is higher than normal, but lower than the diabetes range. It also means you are at risk of getting type 2 diabetes and heart disease. If you are told you have pre-diabetes, have your  blood glucose checked again in 1 to 2 years. WHAT IS THE TREATMENT FOR DIABETES? Treatment is aimed at keeping blood glucose near normal levels at all times. Learning how to manage this yourself is important in treating diabetes. Depending on the type of diabetes you have, your treatment will include one or more of the following:  Monitoring your blood glucose.   Meal planning.   Exercise.   Oral medicine (pills) or insulin.  CAN DIABETES BE PREVENTED? With type 1 diabetes, prevention is more difficult, because the triggers that cause it are not yet known. With type 2 diabetes, prevention is more likely, with lifestyle changes:  Maintain a healthy weight.   Eat healthy.   Exercise.  IS THERE A CURE FOR  DIABETES? No, there is no cure for diabetes. There is a lot of research going on that is looking for a cure, and progress is being made. Diabetes can be treated and controlled. People with diabetes can manage their diabetes and lead normal, active lives. SHOULD I BE TESTED FOR DIABETES? If you are at least 58 years old, you should be tested for diabetes. You should be tested again every 3 years. If you are 45 or older and overweight, you may want to get tested more often. If you are younger than 45, overweight, and have one or more of the following risk factors, you should be tested:  Family history of diabetes.   Inactive lifestyle.   High blood pressure.  WHAT ARE SOME OTHER SOURCES FOR INFORMATION ON DIABETES? The following organizations may help in your search for more information on diabetes: National Diabetes Education Program (NDEP) Internet: SolarDiscussions.es American Diabetes Association Internet: http://www.diabetes.org  Juvenile Diabetes Foundation International Internet: WetlessWash.is Document Released: 08/23/2003 Document Revised: 05/02/2011 Document Reviewed: 06/17/2009 [facility] Patient Information 2012 [place], [place].  Do not cross your legs. This may decrease the blood flow to your feet.   If you find a minor scrape, cut, or break in the skin on your feet, keep it and the skin around it clean and dry. These areas may be cleansed with mild soap and water. Do not use peroxide, alcohol, iodine or Merthiolate.   When you remove an adhesive bandage, be sure not to harm the skin around it.   If you have a wound, look at it several times a day to make sure it is healing.   Do not use heating pads or hot water bottles. Burns can occur. If you have lost feeling in your feet or legs, you may not know it is happening until it is too late.   Report any cuts, sores or bruises to your caregiver. Do not wait!  SEEK MEDICAL CARE IF:   You have an injury that is  not healing or you notice redness, numbness, burning, or tingling.   Your feet always feel cold.   You have pain or cramps in your legs and feet.  SEEK IMMEDIATE MEDICAL CARE IF:   There is increasing redness, swelling, or increasing pain in the wound.   There is a red line that goes up your leg.   Pus is coming from a wound.   You develop an unexplained oral temperature above 102 F (38.9 C), or as your caregiver suggests.   You notice a bad smell coming from an ulcer or wound.  MAKE SURE YOU:   Understand these instructions.   Will watch your condition.   Will get help right away if you are not doing well or get  worse.  Document Released: 08/17/2000 Document Revised: 05/02/2011 Document Reviewed: 02/23/2009 [facility] Patient Information 2012 [place], [place].

## 2011-10-23 ENCOUNTER — Ambulatory Visit (INDEPENDENT_AMBULATORY_CARE_PROVIDER_SITE_OTHER): Payer: BC Managed Care – PPO | Admitting: Internal Medicine

## 2011-10-23 ENCOUNTER — Encounter: Payer: Self-pay | Admitting: Internal Medicine

## 2011-10-23 VITALS — BP 120/80 | HR 66 | Wt 245.0 lb

## 2011-10-23 DIAGNOSIS — E119 Type 2 diabetes mellitus without complications: Secondary | ICD-10-CM

## 2011-10-23 DIAGNOSIS — E669 Obesity, unspecified: Secondary | ICD-10-CM

## 2011-10-23 DIAGNOSIS — IMO0001 Reserved for inherently not codable concepts without codable children: Secondary | ICD-10-CM

## 2011-10-23 DIAGNOSIS — L538 Other specified erythematous conditions: Secondary | ICD-10-CM

## 2011-10-23 DIAGNOSIS — E785 Hyperlipidemia, unspecified: Secondary | ICD-10-CM

## 2011-10-23 DIAGNOSIS — E1165 Type 2 diabetes mellitus with hyperglycemia: Secondary | ICD-10-CM

## 2011-10-23 DIAGNOSIS — L304 Erythema intertrigo: Secondary | ICD-10-CM | POA: Insufficient documentation

## 2011-10-23 MED ORDER — LORAZEPAM 0.5 MG PO TABS: 0.5000 mg | ORAL_TABLET | Freq: Three times a day (TID) | ORAL | Status: AC

## 2011-10-23 MED ORDER — AMPHETAMINE-DEXTROAMPHETAMINE 10 MG PO TABS: 30.0000 mg | ORAL_TABLET | Freq: Every day | ORAL | Status: DC

## 2011-10-23 NOTE — Progress Notes (Signed)
Subjective:    Patient ID: Paige Hernandez, female    DOB: 17-May-1956, 56 y.o.   MRN: [id number]  HPI  Patient comes in today for follow up of  multiple medical problems.   She was seen last month by the nurse practitioner when she came in with polyuria of note was secondary to diabetes out of control. At that time she had stopped her diabetes medicine one point to see because was causing nausea she went on a trip and she just stopped.  When she was made aware that her blood sugars were very high in the 300s she has been taking her medicines since last month but has only been able  Since last   Visit  \\back on .6 Victoza  and has missed 3 doses.  Slept through a few doses. Taking at different times. When she tries 1.2 she gets nausea and it is difficult however she only eats once a day because she  is very busy. She is taking at least 3 WelChol the day sometimes forgets to take 6 She is back on Amaryl 2 mg a day Blood sugar last evening at home was 136 She has been getting intertrigo on her lower abdomen that she treats with drying agent and a topical Lotrimin. Currently it is not active.  She's had some vision changes over the last year or but had an eye check so apparently was normal no unusual infections chest pain shortness of breath  Needs refill on her ADD medication when she travels to be able to focus.   Review of Systems Neg chest pain sob.   Past history family history social history reviewed in the electronic medical record.     Objective:   Physical Exam Wt Readings from Last 3 Encounters:  10/23/11 245 lb (111.131 kg)  09/25/11 250 lb (113.399 kg)  05/02/11 262 lb (118.842 kg)   well-developed well-nourished in no acute distress she looks well today no acute skin changes. Lab Results  Component Value Date   WBC 6.9 04/19/2011   HGB 12.7 04/19/2011   HCT 38.9 04/19/2011   PLT 172.0 04/19/2011   GLUCOSE 338* 09/25/2011   CHOL 161 09/25/2011   TRIG 130.0 09/25/2011   HDL  40.20 09/25/2011   LDLDIRECT 174.5 03/03/2007   LDLCALC 95 09/25/2011   ALT 63* 04/19/2011   AST 55* 04/19/2011   NA 137 09/25/2011   K 4.2 09/25/2011   CL 105 09/25/2011   CREATININE 1.0 09/25/2011   BUN 15 09/25/2011   CO2 24 09/25/2011   TSH 2.59 04/19/2011   HGBA1C 10.6* 09/25/2011   MICROALBUR 1.4 09/25/2011     Lab Results  Component Value Date   HGBA1C 10.6* 09/25/2011   Reviewed data from the electronic record renal function is normal.     Assessment & Plan:  Dm out of control   Reported better.   Disc insulin options today  But compliance was  Very bad before January visit.  Barriers.     Emotional eating one meal a day.  Patient reveals that she is now taking this situation seriously for her own health. Discussed nutritional referral she hasn't name of Lorene Dy she can call us about official referral but I think is appropriate in her situation she has questions about carbs eating and snacking.  Obesity Hyperlipidemia he is now taking the atorvastatin ADHD Anxiety about flying okay to refill Ativan briefly Mood  okay to continue on Celexa We will be keeping a close eye  on her situation and have her recheck in one month information on diabetes monitoring log given strategies discussed she will call about nutrition referral if we need to do that.  Total visit > 50% spent counseling and coordinating care

## 2011-10-23 NOTE — Patient Instructions (Signed)
Very important to take meds  .   Divide food calories in to  More frequent  Healthy eating.  Losing weight  In a healthy manner helps all of this. Insulin  Is always an option.   to control sugars  But would rec you take the meds.  Call for a referral once you get the info and appt.   Can use OTC   And lotrimin ok .  Cramps can get better when sugar better.   Intertrigo Intertrigo is a skin condition that occurs in between folds of skin in places on the body that rub together a lot and do not get much ventilation. It is caused by heat, moisture, friction, sweat retention, and lack of air circulation, which produces red, irritated patches and, sometimes, scaling or drainage. People who have diabetes, who are obese, or who have treatment with antibiotics are at increased risk for intertrigo. The most common sites for intertrigo to occur include:  The groin.   The breasts.   The armpits.   Folds of abdominal skin.   Webbed spaces between the fingers or toes.  Intertrigo may be aggravated by:  Sweat.   Feces.   Yeast or bacteria that are present near skin folds.   Urine.   Vaginal discharge.  HOME CARE INSTRUCTIONS  The following steps can be taken to reduce friction and keep the affected area cool and dry:   Expose skin folds to the air.   Keep deep skin folds separated with cotton or linen cloth. Avoid tight fitting clothing that could cause chafing.   Wear open-toed shoes or sandals to help reduce moisture between the toes.   Apply absorbent powders to affected areas as directed by your caregiver.   Apply over-the-counter barrier pastes, such as zinc oxide, as directed by your caregiver.   If you develop a fungal infection in the affected area, your caregiver may have you use antifungal creams.  SEEK MEDICAL CARE IF:   The rash is not improving after 1 week of treatment.   The rash is getting worse (more red, more swollen, more painful, or spreading).   You have a  fever or chills.  MAKE SURE YOU:   Understand these instructions.   Will watch your condition.   Will get help right away if you are not doing well or get worse.  Document Released: 08/20/2005 Document Revised: 05/02/2011 Document Reviewed: 02/02/2010 [facility] Patient Information 2012 [place], [place].

## 2011-10-24 NOTE — Assessment & Plan Note (Signed)
Currently not active discussed getting diabetes under control as well as local skin care. Patient aware

## 2011-11-16 ENCOUNTER — Other Ambulatory Visit: Payer: Self-pay | Admitting: Internal Medicine

## 2011-11-20 ENCOUNTER — Encounter: Payer: Self-pay | Admitting: Internal Medicine

## 2011-11-20 ENCOUNTER — Ambulatory Visit (INDEPENDENT_AMBULATORY_CARE_PROVIDER_SITE_OTHER): Payer: BC Managed Care – PPO | Admitting: Internal Medicine

## 2011-11-20 VITALS — BP 120/80 | HR 78 | Wt 244.0 lb

## 2011-11-20 DIAGNOSIS — E119 Type 2 diabetes mellitus without complications: Secondary | ICD-10-CM

## 2011-11-20 DIAGNOSIS — E1165 Type 2 diabetes mellitus with hyperglycemia: Secondary | ICD-10-CM

## 2011-11-20 DIAGNOSIS — IMO0001 Reserved for inherently not codable concepts without codable children: Secondary | ICD-10-CM

## 2011-11-20 DIAGNOSIS — E785 Hyperlipidemia, unspecified: Secondary | ICD-10-CM

## 2011-11-20 LAB — GLUCOSE, POCT (MANUAL RESULT ENTRY): POC Glucose: 168

## 2011-11-20 MED ORDER — FUROSEMIDE 20 MG PO TABS: 20.0000 mg | ORAL_TABLET | Freq: Every day | ORAL | Status: DC

## 2011-11-20 NOTE — Progress Notes (Signed)
  Subjective:    Patient ID: Paige Hernandez, female    DOB: 03/06/1956, 56 y.o.   MRN: [id number]  HPI Patient comes in today for follow up of  multiple medical problems.  Since last visit has been implementing  Lsi. MOst sugars are 130- 160 and post prandial are low 200 to 190  Altered diet .   Since last night   Has begun    1.2  Of victoza .  Concerned about nausea  . Only on  3 of welchol.   So far . hasn't seen nutritionist yet but has name/  Review of Systems NO cp sob     Objective:   Physical Exam Wt Readings from Last 3 Encounters:  11/20/11 244 lb (110.678 kg)  10/23/11 245 lb (111.131 kg)  09/25/11 250 lb (113.399 kg)   WDWN in and looks well  Lab reviewed  bg 162 today       Assessment & Plan:   DM doing better some nausea with meds but now seems more  motivated   Will do dietician referral Gae Bon RD    [facility] [address] [phone] Obesity  Lipids   Counseled.  Recheck with labs in 1 month or as needed .

## 2011-11-20 NOTE — Patient Instructions (Addendum)
Try to increase the victoza to 1.2 and eventually welchol increase as tolerated.     Continue  Diet changes NO sugar drinks  .   Check labs in one month and FU.   Hg A1c   BMP Pre visit  Will do dietician  Referral .

## 2011-12-14 ENCOUNTER — Other Ambulatory Visit (INDEPENDENT_AMBULATORY_CARE_PROVIDER_SITE_OTHER): Payer: BC Managed Care – PPO

## 2011-12-14 DIAGNOSIS — E119 Type 2 diabetes mellitus without complications: Secondary | ICD-10-CM

## 2011-12-14 LAB — BASIC METABOLIC PANEL
Chloride: 104 mEq/L (ref 96–112)
GFR: 78.47 mL/min (ref >60.00)
Potassium: 4.6 mEq/L (ref 3.5–5.1)

## 2011-12-14 LAB — HEMOGLOBIN A1C: Hgb A1c MFr Bld: 9.4 % — ABNORMAL HIGH (ref 4.6–6.5)

## 2011-12-16 ENCOUNTER — Other Ambulatory Visit: Payer: Self-pay | Admitting: Internal Medicine

## 2011-12-21 ENCOUNTER — Encounter: Payer: Self-pay | Admitting: Internal Medicine

## 2011-12-21 ENCOUNTER — Ambulatory Visit (INDEPENDENT_AMBULATORY_CARE_PROVIDER_SITE_OTHER): Payer: BC Managed Care – PPO | Admitting: Internal Medicine

## 2011-12-21 VITALS — BP 118/80 | HR 68 | Temp 97.6°F | Wt 245.0 lb

## 2011-12-21 DIAGNOSIS — E119 Type 2 diabetes mellitus without complications: Secondary | ICD-10-CM

## 2011-12-21 DIAGNOSIS — E669 Obesity, unspecified: Secondary | ICD-10-CM

## 2011-12-21 DIAGNOSIS — IMO0001 Reserved for inherently not codable concepts without codable children: Secondary | ICD-10-CM

## 2011-12-21 DIAGNOSIS — K602 Anal fissure, unspecified: Secondary | ICD-10-CM | POA: Insufficient documentation

## 2011-12-21 DIAGNOSIS — E785 Hyperlipidemia, unspecified: Secondary | ICD-10-CM

## 2011-12-21 DIAGNOSIS — E1165 Type 2 diabetes mellitus with hyperglycemia: Secondary | ICD-10-CM

## 2011-12-21 DIAGNOSIS — Z872 Personal history of diseases of the skin and subcutaneous tissue: Secondary | ICD-10-CM

## 2011-12-21 LAB — GLUCOSE, POCT (MANUAL RESULT ENTRY): POC Glucose: 232

## 2011-12-21 MED ORDER — LIDOCAINE-HYDROCORTISONE ACE 3-1 % EX PADS: 1.0000 "application " | MEDICATED_PAD | Freq: Two times a day (BID) | CUTANEOUS | Status: DC

## 2011-12-21 MED ORDER — LIDOCAINE-HYDROCORTISONE ACE 2-2 % RE KIT: 1.0000 "application " | PACK | Freq: Two times a day (BID) | RECTAL | Status: DC

## 2011-12-21 MED ORDER — GLIMEPIRIDE 2 MG PO TABS: 4.0000 mg | ORAL_TABLET | Freq: Every day | ORAL | Status: AC

## 2011-12-21 NOTE — Patient Instructions (Addendum)
increase the amaryl   To 4 mg per day ( on 2 mg now) You diabetes is still not in control.   Continue to increase the other  meds as planned  May need   To use insulin. Such as lantus   Basal insulin.

## 2011-12-21 NOTE — Progress Notes (Signed)
  Subjective:    Patient ID: Paige Hernandez, female    DOB: 1956/03/18, 56 y.o.   MRN: [id number]  HPI Patient comes in today for follow up of  multiple medical problems.  More specifically her diabetes. Since her last month she's been unable to increase her Pitocin and her WelChol. She is still trying but has not been as intensive. Denies any new symptoms related to the diabetesAt .6  Of victoza .Welchol  3 per day.  Would like a refill of her perianal preparation for recurrent fissure.  It helps her when it flares up.  Has an occasional left lower pelvic area discomfort like when she had polycystic ovary. She does not have it at this time sometimes it lasts days one time it lasted a week there were no associated symptoms.   Review of Systems Negative for fever chest pain shortness of breath syncope. No numbness vision changes  Past history family history social history reviewed in the electronic medical record.     Objective:   Physical Exam BP 118/80  Pulse 68  Temp(Src) 97.6 F (36.4 C) (Oral)  Wt 245 lb (111.131 kg) Well-developed well-nourished in no acute distress looks well today blood sugars in the 200s.  Reviewed laboratory studies with patient. Lab Results  Component Value Date   WBC 6.9 04/19/2011   HGB 12.7 04/19/2011   HCT 38.9 04/19/2011   PLT 172.0 04/19/2011   GLUCOSE 214* 12/14/2011   CHOL 161 09/25/2011   TRIG 130.0 09/25/2011   HDL 40.20 09/25/2011   LDLDIRECT 174.5 03/03/2007   LDLCALC 95 09/25/2011   ALT 63* 04/19/2011   AST 55* 04/19/2011   NA 139 12/14/2011   K 4.6 12/14/2011   CL 104 12/14/2011   CREATININE 0.8 12/14/2011   BUN 14 12/14/2011   CO2 28 12/14/2011   TSH 2.59 04/19/2011   HGBA1C 9.4* 12/14/2011   MICROALBUR 1.4 09/25/2011   Wt Readings from Last 3 Encounters:  12/21/11 245 lb (111.131 kg)  11/20/11 244 lb (110.678 kg)  10/23/11 245 lb (111.131 kg)      Assessment & Plan:   Diabetes out of control  slight improvement over the last 10 weeks  however she is nowhere near her goal. Discussed other options. At this time we'll increase her Amaryl and she will intensify her treatment and try to increase her because her WelChol however if this does not work we may just try adding basal insulin.  In the meantime increase the sulfonylurea. To 4 mg   Perianal fissure recurrence refill her medicine today prescriptions given.  Obesity as above  Intermittent left lower pelvic pain discomfort she should see her GYN to make sure there's not a gynecologic cause and then readdress this when she comes back in for followup. However if she gets an acute finding she can contact us earlier.  Total visit > 50% spent counseling and coordinating care

## 2012-01-16 ENCOUNTER — Other Ambulatory Visit: Payer: Self-pay | Admitting: Internal Medicine

## 2012-01-21 ENCOUNTER — Telehealth: Payer: Self-pay | Admitting: Internal Medicine

## 2012-01-21 NOTE — Telephone Encounter (Signed)
Pt needs work in [place] for August, either for the 1st, 2nd, 19th thru the 30th. Pt also said that she can come in June or July if August doesn't work.

## 2012-01-21 NOTE — Telephone Encounter (Signed)
Pls advise.  

## 2012-01-24 NOTE — Telephone Encounter (Signed)
Advise Wednesday August 28th  But she still needs to keep her diabetes appt in July

## 2012-01-25 ENCOUNTER — Telehealth: Payer: Self-pay

## 2012-01-25 MED ORDER — COLESEVELAM HCL 625 MG PO TABS: 1875.0000 mg | ORAL_TABLET | Freq: Two times a day (BID) | ORAL | Status: DC

## 2012-01-25 NOTE — Telephone Encounter (Signed)
Pls schedule

## 2012-01-25 NOTE — Telephone Encounter (Signed)
Called and lft vm for pt to call re: sch work in [place] for Aug as noted.

## 2012-01-25 NOTE — Telephone Encounter (Signed)
Rx sent to pharmacy.  Called and spoke with pt and pt is aware.  

## 2012-01-25 NOTE — Telephone Encounter (Signed)
Pt states she went to [employer] to pick up her rx for [facility] and was told that it was sent back "inappropriate request".  Pt states at her last visit she and Dr.Panosh discussed this medicaiton and pt should be taking 6 pills daily.  Pls advise on if medication was d/c or if pt should still be on medication.

## 2012-01-25 NOTE — Telephone Encounter (Signed)
I didn't stop the med  On the emr apparently  it expired by itself.  Please renew this med  To take 3 pills bid  Refill for 6 months.

## 2012-01-30 ENCOUNTER — Ambulatory Visit: Payer: BC Managed Care – PPO | Admitting: Internal Medicine

## 2012-01-30 NOTE — Telephone Encounter (Signed)
Called and lft another vm for pt re: sch work in [place] for Aug as noted.

## 2012-03-10 ENCOUNTER — Encounter: Payer: Self-pay | Admitting: Internal Medicine

## 2012-03-10 ENCOUNTER — Ambulatory Visit (INDEPENDENT_AMBULATORY_CARE_PROVIDER_SITE_OTHER): Payer: BC Managed Care – PPO | Admitting: Internal Medicine

## 2012-03-10 VITALS — BP 116/80 | HR 90 | Temp 98.2°F | Wt 246.0 lb

## 2012-03-10 DIAGNOSIS — E669 Obesity, unspecified: Secondary | ICD-10-CM

## 2012-03-10 DIAGNOSIS — F988 Other specified behavioral and emotional disorders with onset usually occurring in childhood and adolescence: Secondary | ICD-10-CM

## 2012-03-10 DIAGNOSIS — E119 Type 2 diabetes mellitus without complications: Secondary | ICD-10-CM

## 2012-03-10 DIAGNOSIS — IMO0001 Reserved for inherently not codable concepts without codable children: Secondary | ICD-10-CM

## 2012-03-10 DIAGNOSIS — E1165 Type 2 diabetes mellitus with hyperglycemia: Secondary | ICD-10-CM

## 2012-03-10 LAB — GLUCOSE, POCT (MANUAL RESULT ENTRY): POC Glucose: 278 mg/dl — AB (ref 70–99)

## 2012-03-10 NOTE — Patient Instructions (Addendum)
Consider    Weekly byetta     To help follow through . Consult    Endocrinology .   Your diabetes is out of control  I am glad you ae otherwise feeling well.

## 2012-03-10 NOTE — Progress Notes (Signed)
Subjective:    Patient ID: Paige Hernandez, female    DOB: Mar 25, 1956, 56 y.o.   MRN: [id number]  HPI Patient comes in today for follow up of  multiple medical problems.  Feeling fine  Busy and not always taking med .taking welchol 3 per day and no victoza x 2-3 weeks  Taking amaryl 4 mg .  Easier to take meds if qd pattern.  No recently checking sugar levels.  No new ha numbness  Cramps or syncope  Review of Systems No new ha numbness  Cramps of syncope Outpatient Encounter Prescriptions as of 03/10/2012  Medication Sig Dispense Refill  . amphetamine-dextroamphetamine (ADDERALL) 10 MG tablet Take 3 tablets (30 mg total) by mouth daily. Take 3 tablets daily as directed.  90 tablet  0  . atorvastatin (LIPITOR) 20 MG tablet TAKE ONE TABLET BY MOUTH ONE TIME DAILY  30 tablet  11  . BAYER MICROLET LANCETS lancets Use as instructed  100 each  12  . citalopram (CELEXA) 20 MG tablet TAKE ONE AND ONE-HALF TABLETS BY MOUTH DAILY  45 tablet  4  . colesevelam (WELCHOL) 625 MG tablet Take 3 tablets (1,875 mg total) by mouth 2 (two) times daily with a meal.  180 tablet  6  . frovatriptan (FROVA) 2.5 MG tablet Take 1 tablet (2.5 mg total) by mouth as needed for migraine. If recurs, may repeat after 2 hours. Max of 3 tabs in 24 hours.  10 tablet  3  . furosemide (LASIX) 20 MG tablet Take 1 tablet (20 mg total) by mouth daily.  30 tablet  3  . glimepiride (AMARYL) 2 MG tablet Take 2 tablets (4 mg total) by mouth daily before breakfast.  60 tablet  3  . Insulin Pen Needle (EASY TOUCH PEN NEEDLES) 32G X 5 MM MISC Use as directed  100 each  11  . Lidocaine-Hydrocortisone Ace (PERANEX HC) 3-1 % PADS Apply 1 application topically 2 (two) times daily. Apply to rectum twice a day as directed.  60 each  1  . Lidocaine-Hydrocortisone Ace 2-2 % KIT Place 1 application rectally 2 (two) times daily.  1 each  3  . Liraglutide (VICTOZA) 18 MG/3ML SOLN .6mg   daily x 1 week can increase to 1.2 mg oer day  6 mL  3  .  loperamide (IMODIUM A-D) 2 MG tablet Take 2 mg by mouth at bedtime.        Marland Kitchen LORazepam (ATIVAN) 0.5 MG tablet Take 1 tablet (0.5 mg total) by mouth every 8 (eight) hours. 1 po bid pre flight  20 tablet  0  . NEXIUM 40 MG capsule TAKE ONE CAPSULE BY MOUTH TWICE DAILY  60 each  6  . OVER THE COUNTER MEDICATION Complete Woman Charger Day Glow Charger Body Toner Charger Night Shift      . DISCONTD: colesevelam (WELCHOL) 625 MG tablet Take 1,875 mg by mouth 2 (two) times daily with a meal.            Objective:   Physical Exam BP 116/80  Pulse 90  Temp 98.2 F (36.8 C) (Oral)  Wt 246 lb (111.585 kg)  SpO2 97% Wt Readings from Last 3 Encounters:  03/10/12 246 lb (111.585 kg)  12/21/11 245 lb (111.131 kg)  11/20/11 244 lb (110.678 kg)   WDWN in nad looks well.   Lab Results  Component Value Date   WBC 6.9 04/19/2011   HGB 12.7 04/19/2011   HCT 38.9 04/19/2011   PLT 172.0  04/19/2011   GLUCOSE 214* 12/14/2011   CHOL 161 09/25/2011   TRIG 130.0 09/25/2011   HDL 40.20 09/25/2011   LDLDIRECT 174.5 03/03/2007   LDLCALC 95 09/25/2011   ALT 63* 04/19/2011   AST 55* 04/19/2011   NA 139 12/14/2011   K 4.6 12/14/2011   CL 104 12/14/2011   CREATININE 0.8 12/14/2011   BUN 14 12/14/2011   CO2 28 12/14/2011   TSH 2.59 04/19/2011   HGBA1C 9.4* 12/14/2011   MICROALBUR 1.4 09/25/2011        Assessment & Plan:  DM   Only on amaryl and  welchol but neither at full dose.  Lifestyle adherance problems willing but having a hard time with this . Working with other problems.  Taking victoza and not  For the last 3 week.  Obesity  ADHD prob contributing.  Dis options    Refer to endocrinology to see if can help with simpler effective regimen to control the diabetes.  Pt agrees.  Total visit > 50% spent counseling and coordinating care

## 2012-04-22 ENCOUNTER — Other Ambulatory Visit (INDEPENDENT_AMBULATORY_CARE_PROVIDER_SITE_OTHER): Payer: BC Managed Care – PPO

## 2012-04-22 DIAGNOSIS — Z0000 Encounter for general adult medical examination without abnormal findings: Secondary | ICD-10-CM

## 2012-04-22 LAB — POCT URINALYSIS DIPSTICK
Nitrite, UA: NEGATIVE
Urobilinogen, UA: 0.2

## 2012-04-22 LAB — BASIC METABOLIC PANEL
BUN: 15 mg/dL (ref 6–23)
CO2: 23 mEq/L (ref 19–32)
Calcium: 8.9 mg/dL (ref 8.4–10.5)
Chloride: 103 mEq/L (ref 96–112)
Creatinine, Ser: 0.9 mg/dL (ref 0.4–1.2)
GFR: 65.87 mL/min (ref >60.00)
Glucose, Bld: 294 mg/dL — ABNORMAL HIGH (ref 70–99)
Potassium: 4 mEq/L (ref 3.5–5.1)
Sodium: 136 mEq/L (ref 135–145)

## 2012-04-22 LAB — MICROALBUMIN / CREATININE URINE RATIO: Creatinine,U: 260.7 mg/dL

## 2012-04-22 LAB — HEPATIC FUNCTION PANEL
ALT: 58 U/L — ABNORMAL HIGH (ref 0–35)
AST: 45 U/L — ABNORMAL HIGH (ref 0–37)
Alkaline Phosphatase: 95 U/L (ref 39–117)
Bilirubin, Direct: 0.1 mg/dL (ref 0.0–0.3)
Total Protein: 6.4 g/dL (ref 6.0–8.3)

## 2012-04-22 LAB — CBC WITH DIFFERENTIAL/PLATELET
Basophils Relative: 0.4 % (ref 0.0–3.0)
Eosinophils Relative: 3.6 % (ref 0.0–5.0)
Hemoglobin: 14.4 g/dL (ref 12.0–15.0)
Lymphocytes Relative: 33.4 % (ref 12.0–46.0)
Neutro Abs: 4.2 10*3/uL (ref 1.4–7.7)
Neutrophils Relative %: 56.4 % (ref 43.0–77.0)
RBC: 5.06 Mil/uL (ref 3.87–5.11)
WBC: 7.4 10*3/uL (ref 4.5–10.5)

## 2012-04-22 LAB — LIPID PANEL
LDL Cholesterol: 108 mg/dL — ABNORMAL HIGH (ref 0–99)
Total CHOL/HDL Ratio: 4

## 2012-04-22 LAB — HEMOGLOBIN A1C: Hgb A1c MFr Bld: 11.2 % — ABNORMAL HIGH (ref 4.6–6.5)

## 2012-04-29 ENCOUNTER — Ambulatory Visit (INDEPENDENT_AMBULATORY_CARE_PROVIDER_SITE_OTHER): Payer: BC Managed Care – PPO | Admitting: Internal Medicine

## 2012-04-29 ENCOUNTER — Encounter: Payer: Self-pay | Admitting: Internal Medicine

## 2012-04-29 VITALS — BP 110/78 | HR 101 | Temp 98.5°F | Wt 243.0 lb

## 2012-04-29 DIAGNOSIS — K219 Gastro-esophageal reflux disease without esophagitis: Secondary | ICD-10-CM

## 2012-04-29 DIAGNOSIS — E669 Obesity, unspecified: Secondary | ICD-10-CM

## 2012-04-29 DIAGNOSIS — R7401 Elevation of levels of liver transaminase levels: Secondary | ICD-10-CM

## 2012-04-29 DIAGNOSIS — IMO0001 Reserved for inherently not codable concepts without codable children: Secondary | ICD-10-CM

## 2012-04-29 DIAGNOSIS — Z0000 Encounter for general adult medical examination without abnormal findings: Secondary | ICD-10-CM

## 2012-04-29 DIAGNOSIS — Z23 Encounter for immunization: Secondary | ICD-10-CM

## 2012-04-29 DIAGNOSIS — F988 Other specified behavioral and emotional disorders with onset usually occurring in childhood and adolescence: Secondary | ICD-10-CM

## 2012-04-29 DIAGNOSIS — E785 Hyperlipidemia, unspecified: Secondary | ICD-10-CM

## 2012-04-29 DIAGNOSIS — E1165 Type 2 diabetes mellitus with hyperglycemia: Secondary | ICD-10-CM

## 2012-04-29 DIAGNOSIS — K7689 Other specified diseases of liver: Secondary | ICD-10-CM

## 2012-04-29 MED ORDER — INSULIN DETEMIR 100 UNIT/ML ~~LOC~~ SOLN: 6.0000 [IU] | Freq: Every day | SUBCUTANEOUS | Status: DC

## 2012-04-29 NOTE — Progress Notes (Signed)
Subjective:    Patient ID: Paige Hernandez, female    DOB: 09-17-1955, 56 y.o.   MRN: [id number]  HPI Patient comes in today for Preventive Health Care visit  And  follow up of  multiple medical problems.  No major change in health status since last visit .  DM:  Still not back on victoza hard time remembering to take  pn doses of meds actually taking nexium 2 in am instead of bid .    Was at beach  When [place] contacted her about endocrine appt. So hasnt gone yet. hasnt seen nutritionist yet either.    Mood stable per her and feels good  Plans on working on  Diabetes better,.    Is pleased as losing weight  Does have polyuria polydypsia .  Takea adderall only on days needed   GERD on 2 per day in am  GI stable takes impdium prn  No vision change  Plans on skin check gets patches on face.  Review of Systems Neg cp sob vision change bleeding abd pain has slight numbness left lateral foot toes ok . No new GU sx except polyuria at times .  Som decrease hearing  With  conversations  Not acute   Outpatient Encounter Prescriptions as of 04/29/2012  Medication Sig Dispense Refill  . amphetamine-dextroamphetamine (ADDERALL) 10 MG tablet Take 3 tablets (30 mg total) by mouth daily. Take 3 tablets daily as directed.  90 tablet  0  . atorvastatin (LIPITOR) 20 MG tablet TAKE ONE TABLET BY MOUTH ONE TIME DAILY  30 tablet  11  . BAYER MICROLET LANCETS lancets Use as instructed  100 each  12  . citalopram (CELEXA) 20 MG tablet TAKE ONE AND ONE-HALF TABLETS BY MOUTH DAILY  45 tablet  4  . colesevelam (WELCHOL) 625 MG tablet Take 3 tablets (1,875 mg total) by mouth 2 (two) times daily with a meal.  180 tablet  6  . frovatriptan (FROVA) 2.5 MG tablet Take 1 tablet (2.5 mg total) by mouth as needed for migraine. If recurs, may repeat after 2 hours. Max of 3 tabs in 24 hours.  10 tablet  3  . furosemide (LASIX) 20 MG tablet Take 1 tablet (20 mg total) by mouth daily.  30 tablet  3  . glimepiride (AMARYL) 2  MG tablet Take 2 tablets (4 mg total) by mouth daily before breakfast.  60 tablet  3  . Insulin Pen Needle (EASY TOUCH PEN NEEDLES) 32G X 5 MM MISC Use as directed  100 each  11  . Lidocaine-Hydrocortisone Ace (PERANEX HC) 3-1 % PADS Apply 1 application topically 2 (two) times daily. Apply to rectum twice a day as directed.  60 each  1  . Lidocaine-Hydrocortisone Ace 2-2 % KIT Place 1 application rectally 2 (two) times daily.  1 each  3  . Liraglutide (VICTOZA) 18 MG/3ML SOLN .6mg   daily x 1 week can increase to 1.2 mg oer day  6 mL  3  . loperamide (IMODIUM A-D) 2 MG tablet Take 2 mg by mouth at bedtime.        Marland Kitchen LORazepam (ATIVAN) 0.5 MG tablet Take 1 tablet (0.5 mg total) by mouth every 8 (eight) hours. 1 po bid pre flight  20 tablet  0  . NEXIUM 40 MG capsule TAKE ONE CAPSULE BY MOUTH TWICE DAILY  60 each  6  . OVER THE COUNTER MEDICATION Complete Woman Charger Day Glow Charger Body Toner Charger Night Shift      .  Past history family history social history reviewed in the electronic medical record.      Objective:   Physical Exam BP 110/78  Pulse 101  Temp 98.5 F (36.9 C) (Oral)  Wt 243 lb (110.224 kg)  SpO2 98% Physical Exam: Vital signs reviewed JYN:WGNF is a well-developed well-nourished alert cooperative  white female who appears her stated age in no acute distress.  HEENT: normocephalic atraumatic , Eyes: PERRL EOM's full, conjunctiva clear, Nares: paten,t no deformity discharge or tenderness., Ears: no deformity EAC's clear TMs with normal landmarks. Mouth: clear OP, no lesions, edema.  Moist mucous membranes. Dentition in adequate repair. NECK: supple without masses, thyromegaly or bruits. CHEST/PULM:  Clear to auscultation and percussion breath sounds equal no wheeze , rales or rhonchi. No chest wall deformities or tenderness. CV: PMI is nondisplaced, S1 S2 no gallops, murmurs, rubs. Peripheral pulses are full without delay.No JVD .  Breast: normal by inspection .  No dimpling, discharge, masses, tenderness or discharge . ABDOMEN: Bowel sounds normal nontender  No guard or rebound, no hepato splenomegal no CVA tenderness.   Extremtities:  No clubbing cyanosis or edema, no acute joint swelling or redness no focal atrophy NEURO:  Oriented x3, cranial nerves 3-12 appear to be intact, no obvious focal weakness,gait within normal limits no abnormal reflexes or asymmetrical SKIN: No acute rashes normal turgor, color, no bruising or petechiae. Some seborrheic keratosis on trunk  PSYCH: Oriented, good eye contact, no obvious depression anxiety, cognition and judgment appear normal. LN: no cervical axillary inguinal adenopathy   Lab Results  Component Value Date   WBC 7.4 04/22/2012   HGB 14.4 04/22/2012   HCT 43.2 04/22/2012   PLT 161.0 04/22/2012   GLUCOSE 294* 04/22/2012   CHOL 200 04/22/2012   TRIG 177.0* 04/22/2012   HDL 56.30 04/22/2012   LDLDIRECT 174.5 03/03/2007   LDLCALC 108* 04/22/2012   ALT 58* 04/22/2012   AST 45* 04/22/2012   NA 136 04/22/2012   K 4.0 04/22/2012   CL 103 04/22/2012   CREATININE 0.9 04/22/2012   BUN 15 04/22/2012   CO2 23 04/22/2012   TSH 2.46 04/22/2012   HGBA1C 11.2* 04/22/2012   MICROALBUR 2.5* 04/22/2012       Assessment & Plan:  Preventive Health Care Counseled regarding healthy nutrition, exercise, sleep, injury prevention, calcium vit d and healthy weight . Get audiology check when can   DM type 2 out of control  hard time getting her medicines in shows me 3 welchol : Her pocket that she was supposed to take with her meal. Discussed strategies again. Has not yet restarted the Victoza  Get Pneumovax and #1 Twinrix today because of her diabetes. Begin low-dose Levemir  her 6 units increase by 2 units every couple days depending on fasting sugar. This is until she sees the endocrinologist  LIPIDS on meds  MOOD stable at this time  ADHD perhaps would do better with  Adherence to her regimen if she did take medication (but did not  discuss this today) Obesity   Problematic  Abn lfts presumed fatty liver  GI   Stable on nexium and prn loperamide.

## 2012-04-29 NOTE — Patient Instructions (Addendum)
Keep appointment with endocrinologist because  diabetes is out of control. Restart checking blood sugars At this time we're starting you on a low dose 24-hour insulin you  can see the endocrinologist.  Begin Levemir 6 units once a day.  Can increase by 2 units per day until the fasting blood sugar is below 140.  It is possible that you could get a low blood sugar but I doubt it at this dose.   Would begin the insulin first and hold  The Victoza  Until you   see the endocrinologist. Can take the nexium  40 mg once in the am and not have to take double dose of this.  Getting pneumovaqx and twinrix today. Second twinrix  in about 2 months  months    ROV in 2- 3  months or as needed.

## 2012-05-02 ENCOUNTER — Encounter: Payer: Self-pay | Admitting: Internal Medicine

## 2012-05-08 ENCOUNTER — Encounter: Payer: Self-pay | Admitting: Internal Medicine

## 2012-05-13 ENCOUNTER — Encounter: Payer: Self-pay | Admitting: Internal Medicine

## 2012-05-19 ENCOUNTER — Ambulatory Visit (INDEPENDENT_AMBULATORY_CARE_PROVIDER_SITE_OTHER): Payer: BC Managed Care – PPO | Admitting: Internal Medicine

## 2012-05-19 ENCOUNTER — Encounter: Payer: Self-pay | Admitting: Internal Medicine

## 2012-05-19 VITALS — BP 116/72 | Temp 98.0°F | Wt 243.0 lb

## 2012-05-19 DIAGNOSIS — J029 Acute pharyngitis, unspecified: Secondary | ICD-10-CM

## 2012-05-19 DIAGNOSIS — E1165 Type 2 diabetes mellitus with hyperglycemia: Secondary | ICD-10-CM

## 2012-05-19 DIAGNOSIS — IMO0001 Reserved for inherently not codable concepts without codable children: Secondary | ICD-10-CM

## 2012-05-19 DIAGNOSIS — J019 Acute sinusitis, unspecified: Secondary | ICD-10-CM

## 2012-05-19 MED ORDER — CEFUROXIME AXETIL 500 MG PO TABS: 500.0000 mg | ORAL_TABLET | Freq: Two times a day (BID) | ORAL | Status: AC

## 2012-05-19 MED ORDER — INSULIN GLARGINE 100 UNIT/ML ~~LOC~~ SOLN: SUBCUTANEOUS | Status: DC
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
## 2012-05-19 NOTE — Assessment & Plan Note (Signed)
Patient has poorly controlled type 2 diabetes. Her last A1c was 11.2. She is likely going to defer her endocrinology appointment tomorrow due to her upper respiratory infection. Discontinue Levemir. Switch to Lantus. Patient directed to start a 20 units at bedtime. Patient understands to self titrate every other day by 3 units until her morning blood sugars are less than 150.  She admits to poor dietary compliance. She eats one large meal per day. Patient will likely need mealtime insulin to adequately control her blood sugar.

## 2012-05-19 NOTE — Assessment & Plan Note (Signed)
56 year old white female with history of diabetes with signs and symptoms of acute maxillary sinusitis. Treat with cefuroxime 500 mg twice daily for 10 days. Use intranasal saline spray as directed. Patient advised to call office if symptoms persist or worsen.

## 2012-05-19 NOTE — Patient Instructions (Signed)
Please call our office if your symptoms do not improve or gets worse. Call our office with your blood sugar in 2-3 days

## 2012-05-21 ENCOUNTER — Encounter: Payer: Self-pay | Admitting: Internal Medicine

## 2012-05-21 ENCOUNTER — Other Ambulatory Visit: Payer: Self-pay | Admitting: Internal Medicine

## 2012-06-19 ENCOUNTER — Other Ambulatory Visit: Payer: BC Managed Care – PPO

## 2012-06-30 ENCOUNTER — Encounter: Payer: BC Managed Care – PPO | Admitting: Internal Medicine

## 2012-07-28 ENCOUNTER — Ambulatory Visit: Payer: BC Managed Care – PPO | Admitting: Internal Medicine

## 2012-07-29 ENCOUNTER — Ambulatory Visit (INDEPENDENT_AMBULATORY_CARE_PROVIDER_SITE_OTHER): Payer: BC Managed Care – PPO | Admitting: Family Medicine

## 2012-07-29 DIAGNOSIS — Z23 Encounter for immunization: Secondary | ICD-10-CM

## 2012-08-06 ENCOUNTER — Other Ambulatory Visit: Payer: Self-pay | Admitting: Internal Medicine

## 2012-08-08 ENCOUNTER — Telehealth: Payer: Self-pay | Admitting: Internal Medicine

## 2012-08-08 NOTE — Telephone Encounter (Signed)
Pt's alc level is back to 7.1 , and average blood sugar is 150. Pt is very excited and wanted you know!!

## 2012-08-08 NOTE — Telephone Encounter (Signed)
noted 

## 2012-11-27 ENCOUNTER — Ambulatory Visit: Payer: BC Managed Care – PPO | Admitting: Family Medicine

## 2012-12-29 ENCOUNTER — Ambulatory Visit (INDEPENDENT_AMBULATORY_CARE_PROVIDER_SITE_OTHER): Payer: BC Managed Care – PPO | Admitting: Family Medicine

## 2012-12-29 DIAGNOSIS — Z23 Encounter for immunization: Secondary | ICD-10-CM

## 2013-04-14 ENCOUNTER — Other Ambulatory Visit: Payer: BC Managed Care – PPO

## 2013-04-21 ENCOUNTER — Encounter: Payer: BC Managed Care – PPO | Admitting: Internal Medicine

## 2013-04-23 ENCOUNTER — Other Ambulatory Visit (INDEPENDENT_AMBULATORY_CARE_PROVIDER_SITE_OTHER): Payer: BC Managed Care – PPO

## 2013-04-23 DIAGNOSIS — Z0000 Encounter for general adult medical examination without abnormal findings: Secondary | ICD-10-CM

## 2013-04-23 LAB — LIPID PANEL
HDL: 55.8 mg/dL (ref >39.00)
LDL Cholesterol: 87 mg/dL (ref 0–99)
Total CHOL/HDL Ratio: 3
VLDL: 24 mg/dL (ref 0.0–40.0)

## 2013-04-23 LAB — CBC WITH DIFFERENTIAL/PLATELET
Basophils Relative: 0.4 % (ref 0.0–3.0)
Eosinophils Relative: 3.6 % (ref 0.0–5.0)
HCT: 44.4 % (ref 36.0–46.0)
Hemoglobin: 15 g/dL (ref 12.0–15.0)
Lymphs Abs: 2.3 10*3/uL (ref 0.7–4.0)
MCV: 85.5 fl (ref 78.0–100.0)
Monocytes Absolute: 0.6 10*3/uL (ref 0.1–1.0)
Neutro Abs: 5.3 10*3/uL (ref 1.4–7.7)
Platelets: 182 10*3/uL (ref 150.0–400.0)
WBC: 8.6 10*3/uL (ref 4.5–10.5)

## 2013-04-23 LAB — HEPATIC FUNCTION PANEL
ALT: 34 U/L (ref 0–35)
AST: 26 U/L (ref 0–37)
Albumin: 3.3 g/dL — ABNORMAL LOW (ref 3.5–5.2)
Total Bilirubin: 0.5 mg/dL (ref 0.3–1.2)
Total Protein: 6.4 g/dL (ref 6.0–8.3)

## 2013-04-23 LAB — BASIC METABOLIC PANEL
BUN: 16 mg/dL (ref 6–23)
Chloride: 105 mEq/L (ref 96–112)
Potassium: 4.5 mEq/L (ref 3.5–5.1)
Sodium: 140 mEq/L (ref 135–145)

## 2013-04-23 LAB — TSH: TSH: 2.17 u[IU]/mL (ref 0.35–5.50)

## 2013-04-23 LAB — HEMOGLOBIN A1C: Hgb A1c MFr Bld: 8.8 % — ABNORMAL HIGH (ref 4.6–6.5)

## 2013-04-27 ENCOUNTER — Ambulatory Visit (INDEPENDENT_AMBULATORY_CARE_PROVIDER_SITE_OTHER): Payer: BC Managed Care – PPO | Admitting: Internal Medicine

## 2013-04-27 ENCOUNTER — Encounter: Payer: Self-pay | Admitting: Internal Medicine

## 2013-04-27 ENCOUNTER — Other Ambulatory Visit (HOSPITAL_COMMUNITY)
Admission: RE | Admit: 2013-04-27 | Discharge: 2013-04-27 | Disposition: A | Discharge status: Home / Self Care | Payer: BC Managed Care – PPO | Source: Ambulatory Visit | Attending: Internal Medicine | Admitting: Internal Medicine

## 2013-04-27 VITALS — BP 108/64 | HR 83 | Temp 97.8°F | Ht 63.75 in | Wt 254.0 lb

## 2013-04-27 DIAGNOSIS — Z1151 Encounter for screening for human papillomavirus (HPV): Secondary | ICD-10-CM | POA: Insufficient documentation

## 2013-04-27 DIAGNOSIS — F988 Other specified behavioral and emotional disorders with onset usually occurring in childhood and adolescence: Secondary | ICD-10-CM

## 2013-04-27 DIAGNOSIS — Z0000 Encounter for general adult medical examination without abnormal findings: Secondary | ICD-10-CM

## 2013-04-27 DIAGNOSIS — Z01419 Encounter for gynecological examination (general) (routine) without abnormal findings: Secondary | ICD-10-CM

## 2013-04-27 DIAGNOSIS — L304 Erythema intertrigo: Secondary | ICD-10-CM

## 2013-04-27 DIAGNOSIS — K219 Gastro-esophageal reflux disease without esophagitis: Secondary | ICD-10-CM

## 2013-04-27 DIAGNOSIS — L538 Other specified erythematous conditions: Secondary | ICD-10-CM

## 2013-04-27 DIAGNOSIS — Z872 Personal history of diseases of the skin and subcutaneous tissue: Secondary | ICD-10-CM

## 2013-04-27 DIAGNOSIS — Z23 Encounter for immunization: Secondary | ICD-10-CM

## 2013-04-27 DIAGNOSIS — F4323 Adjustment disorder with mixed anxiety and depressed mood: Secondary | ICD-10-CM

## 2013-04-27 DIAGNOSIS — R51 Headache: Secondary | ICD-10-CM

## 2013-04-27 DIAGNOSIS — E669 Obesity, unspecified: Secondary | ICD-10-CM

## 2013-04-27 MED ORDER — FROVATRIPTAN SUCCINATE 2.5 MG PO TABS: 2.5000 mg | ORAL_TABLET | ORAL | Status: DC | PRN

## 2013-04-27 MED ORDER — ESCITALOPRAM OXALATE 10 MG PO TABS: 10.0000 mg | ORAL_TABLET | Freq: Every day | ORAL | Status: DC

## 2013-04-27 MED ORDER — LIDOCAINE-HYDROCORTISONE ACE 2-2 % RE KIT: 1.0000 "application " | PACK | Freq: Two times a day (BID) | RECTAL | Status: DC

## 2013-04-27 MED ORDER — GLIMEPIRIDE 2 MG PO TABS: 4.0000 mg | ORAL_TABLET | Freq: Every day | ORAL | Status: DC

## 2013-04-27 MED ORDER — LIDOCAINE-HYDROCORTISONE ACE 3-1 % EX PADS: 1.0000 "application " | MEDICATED_PAD | Freq: Two times a day (BID) | CUTANEOUS | Status: DC

## 2013-04-27 NOTE — Progress Notes (Signed)
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 every 10 years.  Measles, mumps, rubella (MMR) immunization. / You need at least 1 dose of MMR if you were born in 1957 or later. You may also need a second dose.  Varicella immunization.** / Consult your caregiver.  Meningococcal immunization.** / Consult your caregiver.  Hepatitis A immunization.** / Consult your caregiver. 2 doses, 6 to 18 months apart.  Hepatitis B immunization.** / Consult your caregiver. 3 doses, usually over 6 months. Ages 38 and over  Blood pressure check.** / Every 1 to 2 years.  Lipid and cholesterol check.** / Every 5 years beginning at age 21.  Clinical breast exam.** / Every year after age 88.  Mammogram.** / Every year beginning at age 67 and continuing for as long as you are in good health. Consult with your caregiver.  Pap test.** / Every 3 years starting at age 31 through age 74 or 72 with a 3 consecutive normal Pap tests. Testing can be stopped between 65 and 70 with 3 consecutive normal Pap tests and no abnormal Pap or HPV tests in the past 10 years.  HPV screening.** / Every 3 years from ages 10 through ages 68 or 43  with a history of 3 consecutive normal Pap tests. Testing can be stopped between 65 and 70 with 3 consecutive normal Pap tests and no abnormal Pap or HPV tests in the past 10 years.  Fecal occult blood test (FOBT) of stool. / Every year beginning at age 86 and continuing until age 51. You may not need to do this test if you get a colonoscopy every 10 years.  Flexible sigmoidoscopy or colonoscopy.** / Every 5 years for a flexible sigmoidoscopy or every 10 years for a colonoscopy beginning at age 78 and continuing until age 40.  Hepatitis C blood test.** / For all people born from [birth year] through 1965 and any individual with known risks for hepatitis C.  Osteoporosis screening.** / A one-time screening for women ages 23 and over and women at risk for fractures or osteoporosis.  Skin self-exam. / Monthly.  Influenza immunization.** / Every  year.  Pneumococcal polysaccharide immunization.** / 1 dose at age 61 (or older) if you have never been vaccinated.  Tetanus, diphtheria, pertussis (Tdap, Td) immunization. / A one-time dose of Tdap vaccine if you are over 65 and have contact with an infant, are a Research scientist (physical sciences), or simply want to be protected from whooping cough. After that, you need a Td booster dose every 10 years.  Varicella immunization.** / Consult your caregiver.  Meningococcal immunization.** / Consult your caregiver.  Hepatitis A immunization.** / Consult your caregiver. 2 doses, 6 to 18 months apart.  Hepatitis B immunization.** / Check with your caregiver. 3 doses, usually over 6 months. ** Family history and personal history of risk and conditions may change your caregiver's recommendations. Document Released: 10/16/2001 Document Revised: 11/12/2011 Document Reviewed: 01/15/2011 [facility] Patient Information 2014 [place], [place].         Neta Mends. Asyia Hornung M.D.  Health Maintenance  Topic Date Due  . Foot Exam  11/19/2010  . Ophthalmology Exam  04/26/2011  . Influenza  Vaccine  04/03/2013  . Hemoglobin A1c  10/24/2013  . Urine Microalbumin  04/23/2014  . Mammogram  05/07/2014  . Pap Smear  04/27/2016  . Colonoscopy  07/16/2016  . Pneumococcal Polysaccharide Vaccine (#2) 04/29/2017  . Tetanus/tdap  04/28/2023   Health Maintenance Review

## 2013-04-27 NOTE — Patient Instructions (Signed)
Intensify lifestyle interventions. And get information to dr Altheimer  For the diabetes. Care   tdap today  Flu vaccine when available.   If pap and hpv negative pap every 5 years    Preventive Care for Adults, Female A healthy lifestyle and preventive care can promote health and wellness. Preventive health guidelines for women include the following key practices.  A routine yearly physical is a good way to check with your caregiver about your health and preventive screening. It is a chance to share any concerns and updates on your health, and to receive a thorough exam.  Visit your dentist for a routine exam and preventive care every 6 months. Brush your teeth twice a day and floss once a day. Good oral hygiene prevents tooth decay and gum disease.  The frequency of eye exams is based on your age, health, family medical history, use of contact lenses, and other factors. Follow your caregiver's recommendations for frequency of eye exams.  Eat a healthy diet. Foods like vegetables, fruits, whole grains, low-fat dairy products, and lean protein foods contain the nutrients you need without too many calories. Decrease your intake of foods high in solid fats, added sugars, and salt. Eat the right amount of calories for you.Get information about a proper diet from your caregiver, if necessary.  Regular physical exercise is one of the most important things you can do for your health. Most adults should get at least 150 minutes of moderate-intensity exercise (any activity that increases your heart rate and causes you to sweat) each week. In addition, most adults need muscle-strengthening exercises on 2 or more days a week.  Maintain a healthy weight. The body mass index (BMI) is a screening tool to identify possible weight problems. It provides an estimate of body fat based on height and weight. Your caregiver can help determine your BMI, and can help you achieve or maintain a healthy weight.For  adults 20 years and older:  A BMI below 18.5 is considered underweight.  A BMI of 18.5 to 24.9 is normal.  A BMI of 25 to 29.9 is considered overweight.  A BMI of 30 and above is considered obese.  Maintain normal blood lipids and cholesterol levels by exercising and minimizing your intake of saturated fat. Eat a balanced diet with plenty of fruit and vegetables. Blood tests for lipids and cholesterol should begin at age 49 and be repeated every 5 years. If your lipid or cholesterol levels are high, you are over 50, or you are at high risk for heart disease, you may need your cholesterol levels checked more frequently.Ongoing high lipid and cholesterol levels should be treated with medicines if diet and exercise are not effective.  If you smoke, find out from your caregiver how to quit. If you do not use tobacco, do not start.  If you are pregnant, do not drink alcohol. If you are breastfeeding, be very cautious about drinking alcohol. If you are not pregnant and choose to drink alcohol, do not exceed 1 drink per day. One drink is considered to be 12 ounces (355 mL) of beer, 5 ounces (148 mL) of wine, or 1.5 ounces (44 mL) of liquor.  Avoid use of street drugs. Do not share needles with anyone. Ask for help if you need support or instructions about stopping the use of drugs.  High blood pressure causes heart disease and increases the risk of stroke. Your blood pressure should be checked at least every 1 to 2 years. Ongoing high  blood pressure should be treated with medicines if weight loss and exercise are not effective.  If you are 80 to 59 years old, ask your caregiver if you should take aspirin to prevent strokes.  Diabetes screening involves taking a blood sample to check your fasting blood sugar level. This should be done once every 3 years, after age 34, if you are within normal weight and without risk factors for diabetes. Testing should be considered at a younger age or be carried out  more frequently if you are overweight and have at least 1 risk factor for diabetes.  Breast cancer screening is essential preventive care for women. You should practice "breast self-awareness." This means understanding the normal appearance and feel of your breasts and may include breast self-examination. Any changes detected, no matter how small, should be reported to a caregiver. Women in their 32s and 30s should have a clinical breast exam (CBE) by a caregiver as part of a regular health exam every 1 to 3 years. After age 31, women should have a CBE every year. Starting at age 52, women should consider having a mammography (breast X-ray test) every year. Women who have a family history of breast cancer should talk to their caregiver about genetic screening. Women at a high risk of breast cancer should talk to their caregivers about having magnetic resonance imaging (MRI) and a mammography every year.  The Pap test is a screening test for cervical cancer. A Pap test can show cell changes on the cervix that might become cervical cancer if left untreated. A Pap test is a procedure in which cells are obtained and examined from the lower end of the uterus (cervix).  Women should have a Pap test starting at age 62.  Between ages 74 and 17, Pap tests should be repeated every 2 years.  Beginning at age 48, you should have a Pap test every 3 years as long as the past 3 Pap tests have been normal.  Some women have medical problems that increase the chance of getting cervical cancer. Talk to your caregiver about these problems. It is especially important to talk to your caregiver if a new problem develops soon after your last Pap test. In these cases, your caregiver may recommend more frequent screening and Pap tests.  The above recommendations are the same for women who have or have not gotten the vaccine for human papillomavirus (HPV).  If you had a hysterectomy for a problem that was not cancer or a  condition that could lead to cancer, then you no longer need Pap tests. Even if you no longer need a Pap test, a regular exam is a good idea to make sure no other problems are starting.  If you are between ages 15 and 62, and you have had normal Pap tests going back 10 years, you no longer need Pap tests. Even if you no longer need a Pap test, a regular exam is a good idea to make sure no other problems are starting.  If you have had past treatment for cervical cancer or a condition that could lead to cancer, you need Pap tests and screening for cancer for at least 20 years after your treatment.  If Pap tests have been discontinued, risk factors (such as a new sexual partner) need to be reassessed to determine if screening should be resumed.  The HPV test is an additional test that may be used for cervical cancer screening. The HPV test looks for the virus  that can cause the cell changes on the cervix. The cells collected during the Pap test can be tested for HPV. The HPV test could be used to screen women aged 47 years and older, and should be used in women of any age who have unclear Pap test results. After the age of 16, women should have HPV testing at the same frequency as a Pap test.  Colorectal cancer can be detected and often prevented. Most routine colorectal cancer screening begins at the age of 76 and continues through age 46. However, your caregiver may recommend screening at an earlier age if you have risk factors for colon cancer. On a yearly basis, your caregiver may provide home test kits to check for hidden blood in the stool. Use of a small camera at the end of a tube, to directly examine the colon (sigmoidoscopy or colonoscopy), can detect the earliest forms of colorectal cancer. Talk to your caregiver about this at age 44, when routine screening begins. Direct examination of the colon should be repeated every 5 to 10 years through age 35, unless early forms of pre-cancerous polyps or  small growths are found.  Hepatitis C blood testing is recommended for all people born from [birth year] through 1965 and any individual with known risks for hepatitis C.  Practice safe sex. Use condoms and avoid high-risk sexual practices to reduce the spread of sexually transmitted infections (STIs). STIs include gonorrhea, chlamydia, syphilis, trichomonas, herpes, HPV, and human immunodeficiency virus (HIV). Herpes, HIV, and HPV are viral illnesses that have no cure. They can result in disability, cancer, and death. Sexually active women aged 84 and younger should be checked for chlamydia. Older women with new or multiple partners should also be tested for chlamydia. Testing for other STIs is recommended if you are sexually active and at increased risk.  Osteoporosis is a disease in which the bones lose minerals and strength with aging. This can result in serious bone fractures. The risk of osteoporosis can be identified using a bone density scan. Women ages 45 and over and women at risk for fractures or osteoporosis should discuss screening with their caregivers. Ask your caregiver whether you should take a calcium supplement or vitamin D to reduce the rate of osteoporosis.  Menopause can be associated with physical symptoms and risks. Hormone replacement therapy is available to decrease symptoms and risks. You should talk to your caregiver about whether hormone replacement therapy is right for you.  Use sunscreen with sun protection factor (SPF) of 30 or more. Apply sunscreen liberally and repeatedly throughout the day. You should seek shade when your shadow is shorter than you. Protect yourself by wearing long sleeves, pants, a wide-brimmed hat, and sunglasses year round, whenever you are outdoors.  Once a month, do a whole body skin exam, using a mirror to look at the skin on your back. Notify your caregiver of new moles, moles that have irregular borders, moles that are larger than a pencil eraser, or  moles that have changed in shape or color.  Stay current with required immunizations.  Influenza. You need a dose every fall (or winter). The composition of the flu vaccine changes each year, so being vaccinated once is not enough.  Pneumococcal polysaccharide. You need 1 to 2 doses if you smoke cigarettes or if you have certain chronic medical conditions. You need 1 dose at age 55 (or older) if you have never been vaccinated.  Tetanus, diphtheria, pertussis (Tdap, Td). Get 1 dose of Tdap vaccine  if you are younger than age 53, are over 80 and have contact with an infant, are a Research scientist (physical sciences), are pregnant, or simply want to be protected from whooping cough. After that, you need a Td booster dose every 10 years. Consult your caregiver if you have not had at least 3 tetanus and diphtheria-containing shots sometime in your life or have a deep or dirty wound.  HPV. You need this vaccine if you are a woman age 63 or younger. The vaccine is given in 3 doses over 6 months.  Measles, mumps, rubella (MMR). You need at least 1 dose of MMR if you were born in 1957 or later. You may also need a second dose.  Meningococcal. If you are age 33 to 57 and a first-year college student living in a residence hall, or have one of several medical conditions, you need to get vaccinated against meningococcal disease. You may also need additional booster doses.  Zoster (shingles). If you are age 46 or older, you should get this vaccine.  Varicella (chickenpox). If you have never had chickenpox or you were vaccinated but received only 1 dose, talk to your caregiver to find out if you need this vaccine.  Hepatitis A. You need this vaccine if you have a specific risk factor for hepatitis A virus infection or you simply wish to be protected from this disease. The vaccine is usually given as 2 doses, 6 to 18 months apart.  Hepatitis B. You need this vaccine if you have a specific risk factor for hepatitis B virus  infection or you simply wish to be protected from this disease. The vaccine is given in 3 doses, usually over 6 months. Preventive Services / Frequency Ages 78 to 51  Blood pressure check.** / Every 1 to 2 years.  Lipid and cholesterol check.** / Every 5 years beginning at age 26.  Clinical breast exam.** / Every 3 years for women in their 37s and 30s.  Pap test.** / Every 2 years from ages 107 through 76. Every 3 years starting at age 38 through age 12 or 38 with a history of 3 consecutive normal Pap tests.  HPV screening.** / Every 3 years from ages 38 through ages 26 to 73 with a history of 3 consecutive normal Pap tests.  Hepatitis C blood test.** / For any individual with known risks for hepatitis C.  Skin self-exam. / Monthly.  Influenza immunization.** / Every year.  Pneumococcal polysaccharide immunization.** / 1 to 2 doses if you smoke cigarettes or if you have certain chronic medical conditions.  Tetanus, diphtheria, pertussis (Tdap, Td) immunization. / A one-time dose of Tdap vaccine. After that, you need a Td booster dose every 10 years.  HPV immunization. / 3 doses over 6 months, if you are 56 and younger.  Measles, mumps, rubella (MMR) immunization. / You need at least 1 dose of MMR if you were born in 1959 or later. You may also need a second dose.  Meningococcal immunization. / 1 dose if you are age 57 to 68 and a first-year college student living in a residence hall, or have one of several medical conditions, you need to get vaccinated against meningococcal disease. You may also need additional booster doses.  Varicella immunization.** / Consult your caregiver.  Hepatitis A immunization.** / Consult your caregiver. 2 doses, 6 to 18 months apart.  Hepatitis B immunization.** / Consult your caregiver. 3 doses usually over 6 months. Ages 64 to 21  Blood pressure check.** /  Every 1 to 2 years.  Lipid and cholesterol check.** / Every 5 years beginning at age  58.  Clinical breast exam.** / Every year after age 61.  Mammogram.** / Every year beginning at age 53 and continuing for as long as you are in good health. Consult with your caregiver.  Pap test.** / Every 3 years starting at age 50 through age 19 or 66 with a history of 3 consecutive normal Pap tests.  HPV screening.** / Every 3 years from ages 25 through ages 72 to 44 with a history of 3 consecutive normal Pap tests.  Fecal occult blood test (FOBT) of stool. / Every year beginning at age 48 and continuing until age 77. You may not need to do this test if you get a colonoscopy every 10 years.  Flexible sigmoidoscopy or colonoscopy.** / Every 5 years for a flexible sigmoidoscopy or every 10 years for a colonoscopy beginning at age 31 and continuing until age 58.  Hepatitis C blood test.** / For all people born from [birth year] through 1967 and any individual with known risks for hepatitis C.  Skin self-exam. / Monthly.  Influenza immunization.** / Every year.  Pneumococcal polysaccharide immunization.** / 1 to 2 doses if you smoke cigarettes or if you have certain chronic medical conditions.  Tetanus, diphtheria, pertussis (Tdap, Td) immunization.** / A one-time dose of Tdap vaccine. After that, you need a Td booster dose every 10 years.  Measles, mumps, rubella (MMR) immunization. / You need at least 1 dose of MMR if you were born in 1959 or later. You may also need a second dose.  Varicella immunization.** / Consult your caregiver.  Meningococcal immunization.** / Consult your caregiver.  Hepatitis A immunization.** / Consult your caregiver. 2 doses, 6 to 18 months apart.  Hepatitis B immunization.** / Consult your caregiver. 3 doses, usually over 6 months. Ages 60 and over  Blood pressure check.** / Every 1 to 2 years.  Lipid and cholesterol check.** / Every 5 years beginning at age 67.  Clinical breast exam.** / Every year after age 9.  Mammogram.** / Every year beginning at  age 75 and continuing for as long as you are in good health. Consult with your caregiver.  Pap test.** / Every 3 years starting at age 81 through age 53 or 52 with a 3 consecutive normal Pap tests. Testing can be stopped between 65 and 70 with 3 consecutive normal Pap tests and no abnormal Pap or HPV tests in the past 10 years.  HPV screening.** / Every 3 years from ages 38 through ages 38 or 49 with a history of 3 consecutive normal Pap tests. Testing can be stopped between 65 and 70 with 3 consecutive normal Pap tests and no abnormal Pap or HPV tests in the past 10 years.  Fecal occult blood test (FOBT) of stool. / Every year beginning at age 38 and continuing until age 91. You may not need to do this test if you get a colonoscopy every 10 years.  Flexible sigmoidoscopy or colonoscopy.** / Every 5 years for a flexible sigmoidoscopy or every 10 years for a colonoscopy beginning at age 27 and continuing until age 49.  Hepatitis C blood test.** / For all people born from [birth year] through 1967 and any individual with known risks for hepatitis C.  Osteoporosis screening.** / A one-time screening for women ages 1 and over and women at risk for fractures or osteoporosis.  Skin self-exam. / Monthly.  Influenza immunization.** / Every  year.  Pneumococcal polysaccharide immunization.** / 1 dose at age 49 (or older) if you have never been vaccinated.  Tetanus, diphtheria, pertussis (Tdap, Td) immunization. / A one-time dose of Tdap vaccine if you are over 65 and have contact with an infant, are a Research scientist (physical sciences), or simply want to be protected from whooping cough. After that, you need a Td booster dose every 10 years.  Varicella immunization.** / Consult your caregiver.  Meningococcal immunization.** / Consult your caregiver.  Hepatitis A immunization.** / Consult your caregiver. 2 doses, 6 to 18 months apart.  Hepatitis B immunization.** / Check with your caregiver. 3 doses, usually over 6  months. ** Family history and personal history of risk and conditions may change your caregiver's recommendations. Document Released: 10/16/2001 Document Revised: 11/12/2011 Document Reviewed: 01/15/2011 [facility] Patient Information 2014 [place], [place].

## 2013-04-29 ENCOUNTER — Other Ambulatory Visit: Payer: Self-pay | Admitting: Family Medicine

## 2013-08-15 ENCOUNTER — Other Ambulatory Visit: Payer: Self-pay | Admitting: Internal Medicine

## 2013-08-18 ENCOUNTER — Other Ambulatory Visit: Payer: Self-pay | Admitting: Family Medicine

## 2013-08-18 MED ORDER — ESOMEPRAZOLE MAGNESIUM 40 MG PO CPDR: 40.0000 mg | DELAYED_RELEASE_CAPSULE | Freq: Every day | ORAL | Status: DC

## 2013-09-11 LAB — HM DIABETES EYE EXAM

## 2013-09-18 ENCOUNTER — Encounter: Payer: Self-pay | Admitting: Internal Medicine

## 2013-10-28 ENCOUNTER — Encounter: Payer: Self-pay | Admitting: Internal Medicine

## 2013-10-28 ENCOUNTER — Encounter: Payer: BC Managed Care – PPO | Admitting: Internal Medicine

## 2013-10-28 NOTE — Progress Notes (Signed)
Document opened and reviewed for OV but appt  canceled same day . Weather?

## 2013-11-01 LAB — LIPID PANEL: LDL CALC: 89 mg/dL

## 2013-11-16 ENCOUNTER — Encounter: Payer: Self-pay | Admitting: Internal Medicine

## 2013-11-16 ENCOUNTER — Encounter (INDEPENDENT_AMBULATORY_CARE_PROVIDER_SITE_OTHER): Payer: Self-pay

## 2013-11-18 ENCOUNTER — Telehealth: Payer: Self-pay | Admitting: Internal Medicine

## 2013-11-18 NOTE — Telephone Encounter (Signed)
Pt states she will check her schedule and cb and sch

## 2014-01-01 LAB — HEMOGLOBIN A1C: HEMOGLOBIN A1C: 6.5 % — AB (ref 4.0–6.0)

## 2014-01-28 ENCOUNTER — Other Ambulatory Visit: Payer: Self-pay | Admitting: Family Medicine

## 2014-02-26 ENCOUNTER — Telehealth: Payer: Self-pay | Admitting: Internal Medicine

## 2014-02-26 NOTE — Telephone Encounter (Signed)
Use the 11:30 and 12 o'clock slots on  04/28/14.  Please schedule them for lab work.  Thanks!

## 2014-02-26 NOTE — Telephone Encounter (Signed)
Pt is needing a phy per her insurance by 05/03/14 for herself and her husband, pt wants to know if dr. Regis Bill can work them in anytime. Ok to use sda slots ?

## 2014-03-09 NOTE — Telephone Encounter (Signed)
Left msg for pt to call back and schedule.

## 2014-03-09 NOTE — Telephone Encounter (Signed)
Pt would like to know if there is anyway she can come after 1:15 any day or anytime on a Friday?  Pt is going to try to work out the wed time, but not sure. Pt works until one at a [employer] and is off on friday

## 2014-03-09 NOTE — Telephone Encounter (Signed)
Ok to work in but leave at least 3 sdas in a given day please

## 2014-03-16 NOTE — Telephone Encounter (Signed)
appt scheduled for pt.  

## 2014-04-01 ENCOUNTER — Encounter: Payer: Self-pay | Admitting: Internal Medicine

## 2014-04-28 ENCOUNTER — Encounter: Payer: BC Managed Care – PPO | Admitting: Internal Medicine

## 2014-05-31 ENCOUNTER — Encounter: Payer: Self-pay | Admitting: Internal Medicine

## 2014-06-23 ENCOUNTER — Other Ambulatory Visit: Payer: Self-pay | Admitting: Internal Medicine

## 2014-06-24 ENCOUNTER — Telehealth: Payer: Self-pay | Admitting: Family Medicine

## 2014-06-24 NOTE — Telephone Encounter (Signed)
Overdue for  Fu visit     refilll for 1 month worth    But have her make appt ROV before runs out  Can work in  If scheduling an issue

## 2014-06-24 NOTE — Telephone Encounter (Signed)
Sent to the pharmacy by e-scribe.  Will send a telephone note to have the pt get on follow up schedule.

## 2014-06-24 NOTE — Telephone Encounter (Signed)
Per WP, "Overdue for  Fu visit     refilll for 1 month worth    But have her make appt ROV before runs out  Can work in  If scheduling an issue"  I have refilled patient's rx and sent to [facility].  Please help make appt per WP's guidelines.

## 2014-06-25 NOTE — Telephone Encounter (Signed)
Pt has been sch

## 2014-07-23 ENCOUNTER — Encounter: Payer: Self-pay | Admitting: Internal Medicine

## 2014-07-23 ENCOUNTER — Ambulatory Visit (INDEPENDENT_AMBULATORY_CARE_PROVIDER_SITE_OTHER): Payer: BC Managed Care – PPO | Admitting: Internal Medicine

## 2014-07-23 VITALS — BP 108/70 | Temp 98.0°F | Ht 63.5 in | Wt 244.0 lb

## 2014-07-23 DIAGNOSIS — E119 Type 2 diabetes mellitus without complications: Secondary | ICD-10-CM

## 2014-07-23 DIAGNOSIS — F909 Attention-deficit hyperactivity disorder, unspecified type: Secondary | ICD-10-CM

## 2014-07-23 DIAGNOSIS — K219 Gastro-esophageal reflux disease without esophagitis: Secondary | ICD-10-CM

## 2014-07-23 DIAGNOSIS — E669 Obesity, unspecified: Secondary | ICD-10-CM

## 2014-07-23 DIAGNOSIS — M25552 Pain in left hip: Secondary | ICD-10-CM

## 2014-07-23 DIAGNOSIS — K602 Anal fissure, unspecified: Secondary | ICD-10-CM

## 2014-07-23 DIAGNOSIS — E785 Hyperlipidemia, unspecified: Secondary | ICD-10-CM

## 2014-07-23 DIAGNOSIS — F988 Other specified behavioral and emotional disorders with onset usually occurring in childhood and adolescence: Secondary | ICD-10-CM

## 2014-07-23 DIAGNOSIS — E1169 Type 2 diabetes mellitus with other specified complication: Secondary | ICD-10-CM

## 2014-07-23 DIAGNOSIS — Z79899 Other long term (current) drug therapy: Secondary | ICD-10-CM

## 2014-07-23 MED ORDER — LIDOCAINE-HYDROCORTISONE ACE 2-2 % RE KIT: 1.0000 "application " | PACK | Freq: Two times a day (BID) | RECTAL | Status: DC

## 2014-07-23 MED ORDER — AMPHETAMINE-DEXTROAMPHETAMINE 10 MG PO TABS: 10.0000 mg | ORAL_TABLET | Freq: Three times a day (TID) | ORAL | Status: DC | PRN

## 2014-07-23 MED ORDER — ESOMEPRAZOLE MAGNESIUM 40 MG PO CPDR: 40.0000 mg | DELAYED_RELEASE_CAPSULE | Freq: Two times a day (BID) | ORAL | Status: DC

## 2014-07-23 MED ORDER — ATORVASTATIN CALCIUM 20 MG PO TABS: 20.0000 mg | ORAL_TABLET | Freq: Every day | ORAL | Status: DC

## 2014-07-23 MED ORDER — FROVATRIPTAN SUCCINATE 2.5 MG PO TABS: 2.5000 mg | ORAL_TABLET | ORAL | Status: DC | PRN
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
## 2014-07-23 NOTE — Patient Instructions (Addendum)
Glad you diabetes is not win control need to continue this  Would like bmp and tsh  At next labs   Concern about hip back   Can do referral .  Ortho .    Can  Increase nexium to twice a day  for now  May need to see your GI doc  If needed.   please  Get appt with  Dr Olevia Perches .

## 2014-07-26 ENCOUNTER — Telehealth: Payer: Self-pay | Admitting: Internal Medicine

## 2014-07-26 ENCOUNTER — Encounter: Payer: Self-pay | Admitting: Internal Medicine

## 2014-07-26 NOTE — Telephone Encounter (Signed)
PA for Frova was denied.  Patient must try and fail Imitrex, Maxalt, Amerge.  PA for generic esomeprazole magnesium was denied. Patient's plan approves brand name Nexium.

## 2014-07-26 NOTE — Telephone Encounter (Signed)
Ask patient which of the choices she has tried and works best or try again based on  Her past response .

## 2014-07-26 NOTE — Telephone Encounter (Signed)
I will submit an Appeal for the Frova.  Can you please re-send the PPI in as Brand Name Nexium, dispense as written??

## 2014-07-26 NOTE — Telephone Encounter (Signed)
Spoke to the pt.  She has tried and failed all 3 for migraines.  Frova is the only thing that works and she states she has been on it for many years.  Unsure why she now needs a PA. Also stated she has been taking one 40 mg Nexium daily.  Was switched to two capsules daily.  Do you still need to do a PA?

## 2014-07-27 MED ORDER — NEXIUM 40 MG PO CPDR: 40.0000 mg | DELAYED_RELEASE_CAPSULE | Freq: Every day | ORAL | Status: DC

## 2014-07-27 NOTE — Telephone Encounter (Signed)
I have resent the Nexium as name brand.  Thanks!

## 2014-08-05 NOTE — Telephone Encounter (Signed)
I receive fax from pharmacy and it has the same insurance used on the PA request.  Waiting to patient to return my call.

## 2014-08-05 NOTE — Telephone Encounter (Signed)
Patient called back and I advised her of the below information.  Patient stated her plan changed and she will call me back with the insurance information.

## 2014-08-05 NOTE — Telephone Encounter (Signed)
I received a letter from [facility] stating patient's plan termed 05/03/14.  The appeal was rejected. I LMOM for patient to call the office and ask for me.  I need patient's current pharmacy insurance information.   I spoke to pharmacy and was advised the insurance we currently have on file matches the termed one they have.  She is going to send me a new PA request with another insurance card on file.

## 2014-08-10 ENCOUNTER — Other Ambulatory Visit: Payer: Self-pay | Admitting: Family Medicine

## 2014-09-25 ENCOUNTER — Other Ambulatory Visit: Payer: Self-pay | Admitting: Internal Medicine

## 2014-09-27 NOTE — Telephone Encounter (Signed)
Sent to the pharmacy by e-scribe. 

## 2015-01-19 LAB — HEMOGLOBIN A1C: Hgb A1c MFr Bld: 8.4 % — AB (ref 4.0–6.0)

## 2015-01-21 ENCOUNTER — Ambulatory Visit (INDEPENDENT_AMBULATORY_CARE_PROVIDER_SITE_OTHER): Payer: BLUE CROSS/BLUE SHIELD | Admitting: Internal Medicine

## 2015-01-21 ENCOUNTER — Encounter: Payer: Self-pay | Admitting: Internal Medicine

## 2015-01-21 VITALS — BP 130/72 | Temp 97.8°F | Ht 63.5 in | Wt 237.6 lb

## 2015-01-21 DIAGNOSIS — E785 Hyperlipidemia, unspecified: Secondary | ICD-10-CM

## 2015-01-21 DIAGNOSIS — F909 Attention-deficit hyperactivity disorder, unspecified type: Secondary | ICD-10-CM | POA: Diagnosis not present

## 2015-01-21 DIAGNOSIS — Z79899 Other long term (current) drug therapy: Secondary | ICD-10-CM

## 2015-01-21 DIAGNOSIS — R350 Frequency of micturition: Secondary | ICD-10-CM

## 2015-01-21 DIAGNOSIS — F988 Other specified behavioral and emotional disorders with onset usually occurring in childhood and adolescence: Secondary | ICD-10-CM

## 2015-01-21 DIAGNOSIS — E119 Type 2 diabetes mellitus without complications: Secondary | ICD-10-CM

## 2015-01-21 LAB — POCT URINALYSIS DIPSTICK
BILIRUBIN UA: NEGATIVE
Blood, UA: 2
Glucose, UA: 3
KETONES UA: NEGATIVE
Nitrite, UA: NEGATIVE
Protein, UA: 1
Spec Grav, UA: 1.025
Urobilinogen, UA: 0.2
pH, UA: 5.5

## 2015-01-21 MED ORDER — AMPHETAMINE-DEXTROAMPHET ER 30 MG PO CP24: 30.0000 mg | ORAL_CAPSULE | ORAL | Status: DC

## 2015-01-21 MED ORDER — CEPHALEXIN 500 MG PO CAPS: 500.0000 mg | ORAL_CAPSULE | Freq: Three times a day (TID) | ORAL | Status: DC

## 2015-01-21 NOTE — Patient Instructions (Addendum)
Uncertain could have a uti  .,culture pending .  Can add antibiotic   If needed before culture.  Try extended release adderall   To see if more helpful . If  helps contact us in a month and we can rx 3 months  Worth.   Need to get back on track for the diabetes .  Plan rov in 6 months  Wellness med check or  As needed
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
## 2015-01-24 ENCOUNTER — Encounter: Payer: Self-pay | Admitting: Family Medicine

## 2015-01-24 LAB — URINE CULTURE: Colony Count: >=100000

## 2015-01-29 NOTE — Progress Notes (Signed)
Quick Note:  Tell patient that urine culture shows e coli sensitive to medication given . Should resolve with current treatment .FU if not better. ______ 

## 2015-02-07 ENCOUNTER — Encounter: Payer: Self-pay | Admitting: Family Medicine

## 2015-03-08 ENCOUNTER — Telehealth: Payer: Self-pay | Admitting: Internal Medicine

## 2015-03-08 NOTE — Telephone Encounter (Signed)
Pt need bloodwork results from June 2013.

## 2015-03-09 NOTE — Telephone Encounter (Signed)
Pt notified of results

## 2015-04-22 ENCOUNTER — Other Ambulatory Visit: Payer: BLUE CROSS/BLUE SHIELD

## 2015-04-29 ENCOUNTER — Encounter: Payer: BLUE CROSS/BLUE SHIELD | Admitting: Internal Medicine

## 2015-06-10 LAB — HM MAMMOGRAPHY

## 2015-06-13 ENCOUNTER — Encounter: Payer: Self-pay | Admitting: Family Medicine

## 2015-06-14 LAB — HM MAMMOGRAPHY

## 2015-06-20 ENCOUNTER — Encounter: Payer: Self-pay | Admitting: Family Medicine

## 2015-07-04 ENCOUNTER — Encounter: Payer: Self-pay | Admitting: Internal Medicine

## 2015-07-11 ENCOUNTER — Ambulatory Visit (INDEPENDENT_AMBULATORY_CARE_PROVIDER_SITE_OTHER): Payer: BLUE CROSS/BLUE SHIELD | Admitting: Internal Medicine

## 2015-07-11 ENCOUNTER — Encounter (INDEPENDENT_AMBULATORY_CARE_PROVIDER_SITE_OTHER): Payer: Self-pay

## 2015-07-11 ENCOUNTER — Encounter: Payer: Self-pay | Admitting: Internal Medicine

## 2015-07-11 VITALS — BP 126/82 | Temp 97.8°F | Wt 237.4 lb

## 2015-07-11 DIAGNOSIS — H109 Unspecified conjunctivitis: Secondary | ICD-10-CM | POA: Diagnosis not present

## 2015-07-11 DIAGNOSIS — Z23 Encounter for immunization: Secondary | ICD-10-CM | POA: Diagnosis not present

## 2015-07-11 MED ORDER — POLYMYXIN B-TRIMETHOPRIM 10000-0.1 UNIT/ML-% OP SOLN: 1.0000 [drp] | OPHTHALMIC | Status: DC

## 2015-07-11 NOTE — Patient Instructions (Signed)
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
## 2015-07-15 ENCOUNTER — Telehealth: Payer: Self-pay | Admitting: Internal Medicine

## 2015-07-15 LAB — HM DIABETES EYE EXAM

## 2015-07-15 NOTE — Telephone Encounter (Deleted)
Pt left a message on my machine.  Asked for a call back on [phone]

## 2015-07-15 NOTE — Telephone Encounter (Signed)
Spoke to the pt.  She has made an appt to see her eye at 1:50 PM with Len Blalock.

## 2015-07-15 NOTE — Telephone Encounter (Signed)
Pt call to say she saw the doctor on Monday and she call today to  say her eye is still irritated . Would like a call back

## 2015-07-19 ENCOUNTER — Encounter: Payer: Self-pay | Admitting: Family Medicine

## 2015-07-19 ENCOUNTER — Other Ambulatory Visit: Payer: Self-pay | Admitting: Family Medicine

## 2015-07-19 MED ORDER — ESCITALOPRAM OXALATE 10 MG PO TABS: 10.0000 mg | ORAL_TABLET | Freq: Every day | ORAL | Status: DC

## 2015-07-19 NOTE — Telephone Encounter (Signed)
Pt has cpx scheduled on 12/05/2015.

## 2015-07-20 ENCOUNTER — Other Ambulatory Visit: Payer: Self-pay | Admitting: *Deleted

## 2015-07-20 MED ORDER — ESCITALOPRAM OXALATE 10 MG PO TABS: 10.0000 mg | ORAL_TABLET | Freq: Every day | ORAL | Status: DC

## 2015-08-01 ENCOUNTER — Other Ambulatory Visit: Payer: Self-pay | Admitting: Internal Medicine

## 2015-08-03 NOTE — Telephone Encounter (Signed)
Does pt need to be seen sooner than April?

## 2015-08-08 NOTE — Telephone Encounter (Signed)
Ok  To wait until April has endocrinologist . Ok to refill through April 17

## 2015-08-08 NOTE — Telephone Encounter (Signed)
Sent to the pharmacy by e-scribe. 

## 2015-10-10 ENCOUNTER — Other Ambulatory Visit: Payer: Self-pay | Admitting: Internal Medicine

## 2015-10-10 NOTE — Telephone Encounter (Signed)
Filled on 07/2015 for six months.  Request is too early.

## 2015-11-28 ENCOUNTER — Other Ambulatory Visit (INDEPENDENT_AMBULATORY_CARE_PROVIDER_SITE_OTHER): Payer: BLUE CROSS/BLUE SHIELD

## 2015-11-28 DIAGNOSIS — Z0000 Encounter for general adult medical examination without abnormal findings: Secondary | ICD-10-CM | POA: Diagnosis not present

## 2015-11-28 LAB — CBC WITH DIFFERENTIAL/PLATELET
BASOS ABS: 0 10*3/uL (ref 0.0–0.1)
Basophils Relative: 0.3 % (ref 0.0–3.0)
Eosinophils Absolute: 0.1 10*3/uL (ref 0.0–0.7)
Eosinophils Relative: 1.4 % (ref 0.0–5.0)
HCT: 45.6 % (ref 36.0–46.0)
Hemoglobin: 15.2 g/dL — ABNORMAL HIGH (ref 12.0–15.0)
LYMPHS ABS: 2.1 10*3/uL (ref 0.7–4.0)
Lymphocytes Relative: 23.2 % (ref 12.0–46.0)
MCHC: 33.4 g/dL (ref 30.0–36.0)
MCV: 85.9 fl (ref 78.0–100.0)
MONO ABS: 0.5 10*3/uL (ref 0.1–1.0)
Monocytes Relative: 5.5 % (ref 3.0–12.0)
NEUTROS PCT: 69.6 % (ref 43.0–77.0)
Neutro Abs: 6.2 10*3/uL (ref 1.4–7.7)
Platelets: 168 10*3/uL (ref 150.0–400.0)
RBC: 5.31 Mil/uL — AB (ref 3.87–5.11)
RDW: 13.6 % (ref 11.5–15.5)
WBC: 8.9 10*3/uL (ref 4.0–10.5)

## 2015-11-28 LAB — LIPID PANEL
CHOL/HDL RATIO: 4
Cholesterol: 207 mg/dL — ABNORMAL HIGH (ref 0–200)
HDL: 58.1 mg/dL (ref >39.00)
LDL CALC: 119 mg/dL — AB (ref 0–99)
NONHDL: 148.41
Triglycerides: 148 mg/dL (ref 0.0–149.0)
VLDL: 29.6 mg/dL (ref 0.0–40.0)

## 2015-11-28 LAB — MICROALBUMIN / CREATININE URINE RATIO
Creatinine,U: 147 mg/dL
MICROALB/CREAT RATIO: 0.9 mg/g (ref 0.0–30.0)
Microalb, Ur: 1.3 mg/dL (ref 0.0–1.9)

## 2015-11-28 LAB — BASIC METABOLIC PANEL
BUN: 22 mg/dL (ref 6–23)
CALCIUM: 9.2 mg/dL (ref 8.4–10.5)
CO2: 23 mEq/L (ref 19–32)
Chloride: 104 mEq/L (ref 96–112)
Creatinine, Ser: 0.84 mg/dL (ref 0.40–1.20)
GFR: 73.18 mL/min (ref >60.00)
GLUCOSE: 250 mg/dL — AB (ref 70–99)
Potassium: 4 mEq/L (ref 3.5–5.1)
SODIUM: 136 meq/L (ref 135–145)

## 2015-11-28 LAB — HEPATIC FUNCTION PANEL
ALK PHOS: 81 U/L (ref 39–117)
ALT: 33 U/L (ref 0–35)
AST: 22 U/L (ref 0–37)
Albumin: 3.8 g/dL (ref 3.5–5.2)
BILIRUBIN DIRECT: 0.1 mg/dL (ref 0.0–0.3)
BILIRUBIN TOTAL: 0.5 mg/dL (ref 0.2–1.2)
Total Protein: 6.2 g/dL (ref 6.0–8.3)

## 2015-11-28 LAB — TSH: TSH: 1.45 u[IU]/mL (ref 0.35–4.50)

## 2015-11-28 LAB — HEMOGLOBIN A1C: Hgb A1c MFr Bld: 12.3 % — ABNORMAL HIGH (ref 4.6–6.5)

## 2015-12-05 ENCOUNTER — Ambulatory Visit (INDEPENDENT_AMBULATORY_CARE_PROVIDER_SITE_OTHER): Payer: BLUE CROSS/BLUE SHIELD | Admitting: Internal Medicine

## 2015-12-05 ENCOUNTER — Encounter: Payer: Self-pay | Admitting: Internal Medicine

## 2015-12-05 VITALS — BP 126/78 | Temp 98.4°F | Ht 63.5 in | Wt 224.1 lb

## 2015-12-05 DIAGNOSIS — E1165 Type 2 diabetes mellitus with hyperglycemia: Secondary | ICD-10-CM | POA: Diagnosis not present

## 2015-12-05 DIAGNOSIS — Z0000 Encounter for general adult medical examination without abnormal findings: Secondary | ICD-10-CM

## 2015-12-05 DIAGNOSIS — Z79899 Other long term (current) drug therapy: Secondary | ICD-10-CM | POA: Diagnosis not present

## 2015-12-05 DIAGNOSIS — F909 Attention-deficit hyperactivity disorder, unspecified type: Secondary | ICD-10-CM | POA: Diagnosis not present

## 2015-12-05 DIAGNOSIS — IMO0001 Reserved for inherently not codable concepts without codable children: Secondary | ICD-10-CM

## 2015-12-05 DIAGNOSIS — E785 Hyperlipidemia, unspecified: Secondary | ICD-10-CM | POA: Diagnosis not present

## 2015-12-05 DIAGNOSIS — F988 Other specified behavioral and emotional disorders with onset usually occurring in childhood and adolescence: Secondary | ICD-10-CM

## 2015-12-05 DIAGNOSIS — K219 Gastro-esophageal reflux disease without esophagitis: Secondary | ICD-10-CM

## 2015-12-05 MED ORDER — ESOMEPRAZOLE MAGNESIUM 40 MG PO CPDR: 40.0000 mg | DELAYED_RELEASE_CAPSULE | Freq: Two times a day (BID) | ORAL | Status: DC

## 2015-12-05 MED ORDER — AMPHETAMINE-DEXTROAMPHET ER 30 MG PO CP24: 30.0000 mg | ORAL_CAPSULE | ORAL | Status: DC

## 2015-12-05 MED ORDER — ATORVASTATIN CALCIUM 20 MG PO TABS: 20.0000 mg | ORAL_TABLET | Freq: Every day | ORAL | Status: DC
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
## 2015-12-05 NOTE — Patient Instructions (Addendum)
Diabetes is way out of control   Get back with Dr A and endocrinology.  CALL TODAY  Take your medication  NO sodas or sugar drinks at all.  caffiene ok   Take the adderall xr 30  to see if helps you organize  more . And take your meds.  ROV in  1 month  Refilling the lipitor today   Health Maintenance, Female Adopting a healthy lifestyle and getting preventive care can go a long way to promote health and wellness. Talk with your health care provider about what schedule of regular examinations is right for you. This is a good chance for you to check in with your provider about disease prevention and staying healthy. In between checkups, there are plenty of things you can do on your own. Experts have done a lot of research about which lifestyle changes and preventive measures are most likely to keep you healthy. Ask your health care provider for more information. WEIGHT AND DIET  Eat a healthy diet  Be sure to include plenty of vegetables, fruits, low-fat dairy products, and lean protein.  Do not eat a lot of foods high in solid fats, added sugars, or salt.  Get regular exercise. This is one of the most important things you can do for your health.  Most adults should exercise for at least 150 minutes each week. The exercise should increase your heart rate and make you sweat (moderate-intensity exercise).  Most adults should also do strengthening exercises at least twice a week. This is in addition to the moderate-intensity exercise.  Maintain a healthy weight  Body mass index (BMI) is a measurement that can be used to identify possible weight problems. It estimates body fat based on height and weight. Your health care provider can help determine your BMI and help you achieve or maintain a healthy weight.  For females 9 years of age and older:   A BMI below 18.5 is considered underweight.  A BMI of 18.5 to 24.9 is normal.  A BMI of 25 to 29.9 is considered overweight.  A BMI of 30  and above is considered obese.  Watch levels of cholesterol and blood lipids  You should start having your blood tested for lipids and cholesterol at 62 years of age, then have this test every 5 years.  You may need to have your cholesterol levels checked more often if:  Your lipid or cholesterol levels are high.  You are older than 62 years of age.  You are at high risk for heart disease.  CANCER SCREENING   Lung Cancer  Lung cancer screening is recommended for adults 54-55 years old who are at high risk for lung cancer because of a history of smoking.  A yearly low-dose CT scan of the lungs is recommended for people who:  Currently smoke.  Have quit within the past 15 years.  Have at least a 30-pack-year history of smoking. A pack year is smoking an average of one pack of cigarettes a day for 1 year.  Yearly screening should continue until it has been 15 years since you quit.  Yearly screening should stop if you develop a health problem that would prevent you from having lung cancer treatment.  Breast Cancer  Practice breast self-awareness. This means understanding how your breasts normally appear and feel.  It also means doing regular breast self-exams. Let your health care provider know about any changes, no matter how small.  If you are in your 7s or  68s, you should have a clinical breast exam (CBE) by a health care provider every 1-3 years as part of a regular health exam.  If you are 6 or older, have a CBE every year. Also consider having a breast X-ray (mammogram) every year.  If you have a family history of breast cancer, talk to your health care provider about genetic screening.  If you are at high risk for breast cancer, talk to your health care provider about having an MRI and a mammogram every year.  Breast cancer gene (BRCA) assessment is recommended for women who have family members with BRCA-related cancers. BRCA-related cancers  include:  Breast.  Ovarian.  Tubal.  Peritoneal cancers.  Results of the assessment will determine the need for genetic counseling and BRCA1 and BRCA2 testing. Cervical Cancer Your health care provider may recommend that you be screened regularly for cancer of the pelvic organs (ovaries, uterus, and vagina). This screening involves a pelvic examination, including checking for microscopic changes to the surface of your cervix (Pap test). You may be encouraged to have this screening done every 3 years, beginning at age 39.  For women ages 105-65, health care providers may recommend pelvic exams and Pap testing every 3 years, or they may recommend the Pap and pelvic exam, combined with testing for human papilloma virus (HPV), every 5 years. Some types of HPV increase your risk of cervical cancer. Testing for HPV may also be done on women of any age with unclear Pap test results.  Other health care providers may not recommend any screening for nonpregnant women who are considered low risk for pelvic cancer and who do not have symptoms. Ask your health care provider if a screening pelvic exam is right for you.  If you have had past treatment for cervical cancer or a condition that could lead to cancer, you need Pap tests and screening for cancer for at least 20 years after your treatment. If Pap tests have been discontinued, your risk factors (such as having a new sexual partner) need to be reassessed to determine if screening should resume. Some women have medical problems that increase the chance of getting cervical cancer. In these cases, your health care provider may recommend more frequent screening and Pap tests. Colorectal Cancer  This type of cancer can be detected and often prevented.  Routine colorectal cancer screening usually begins at 62 years of age and continues through 62 years of age.  Your health care provider may recommend screening at an earlier age if you have risk factors for  colon cancer.  Your health care provider may also recommend using home test kits to check for hidden blood in the stool.  A small camera at the end of a tube can be used to examine your colon directly (sigmoidoscopy or colonoscopy). This is done to check for the earliest forms of colorectal cancer.  Routine screening usually begins at age 15.  Direct examination of the colon should be repeated every 5-10 years through 62 years of age. However, you may need to be screened more often if early forms of precancerous polyps or small growths are found. Skin Cancer  Check your skin from head to toe regularly.  Tell your health care provider about any new moles or changes in moles, especially if there is a change in a mole's shape or color.  Also tell your health care provider if you have a mole that is larger than the size of a pencil eraser.  Always  use sunscreen. Apply sunscreen liberally and repeatedly throughout the day.  Protect yourself by wearing long sleeves, pants, a wide-brimmed hat, and sunglasses whenever you are outside. HEART DISEASE, DIABETES, AND HIGH BLOOD PRESSURE   High blood pressure causes heart disease and increases the risk of stroke. High blood pressure is more likely to develop in:  People who have blood pressure in the high end of the normal range (130-139/85-89 mm Hg).  People who are overweight or obese.  People who are African American.  If you are 78-37 years of age, have your blood pressure checked every 3-5 years. If you are 50 years of age or older, have your blood pressure checked every year. You should have your blood pressure measured twice--once when you are at a hospital or clinic, and once when you are not at a hospital or clinic. Record the average of the two measurements. To check your blood pressure when you are not at a hospital or clinic, you can use:  An automated blood pressure machine at a pharmacy.  A home blood pressure monitor.  If you  are between 10 years and 54 years old, ask your health care provider if you should take aspirin to prevent strokes.  Have regular diabetes screenings. This involves taking a blood sample to check your fasting blood sugar level.  If you are at a normal weight and have a low risk for diabetes, have this test once every three years after 62 years of age.  If you are overweight and have a high risk for diabetes, consider being tested at a younger age or more often. PREVENTING INFECTION  Hepatitis B  If you have a higher risk for hepatitis B, you should be screened for this virus. You are considered at high risk for hepatitis B if:  You were born in a country where hepatitis B is common. Ask your health care provider which countries are considered high risk.  Your parents were born in a high-risk country, and you have not been immunized against hepatitis B (hepatitis B vaccine).  You have HIV or AIDS.  You use needles to inject street drugs.  You live with someone who has hepatitis B.  You have had sex with someone who has hepatitis B.  You get hemodialysis treatment.  You take certain medicines for conditions, including cancer, organ transplantation, and autoimmune conditions. Hepatitis C  Blood testing is recommended for:  Everyone born from [birth year] through 1967.  Anyone with known risk factors for hepatitis C. Sexually transmitted infections (STIs)  You should be screened for sexually transmitted infections (STIs) including gonorrhea and chlamydia if:  You are sexually active and are younger than 62 years of age.  You are older than 62 years of age and your health care provider tells you that you are at risk for this type of infection.  Your sexual activity has changed since you were last screened and you are at an increased risk for chlamydia or gonorrhea. Ask your health care provider if you are at risk.  If you do not have HIV, but are at risk, it may be recommended that you  take a prescription medicine daily to prevent HIV infection. This is called pre-exposure prophylaxis (PrEP). You are considered at risk if:  You are sexually active and do not regularly use condoms or know the HIV status of your partner(s).  You take drugs by injection.  You are sexually active with a partner who has HIV. Talk with your health care  provider about whether you are at high risk of being infected with HIV. If you choose to begin PrEP, you should first be tested for HIV. You should then be tested every 3 months for as long as you are taking PrEP.  PREGNANCY   If you are premenopausal and you may become pregnant, ask your health care provider about preconception counseling.  If you may become pregnant, take 400 to 800 micrograms (mcg) of folic acid every day.  If you want to prevent pregnancy, talk to your health care provider about birth control (contraception). OSTEOPOROSIS AND MENOPAUSE   Osteoporosis is a disease in which the bones lose minerals and strength with aging. This can result in serious bone fractures. Your risk for osteoporosis can be identified using a bone density scan.  If you are 40 years of age or older, or if you are at risk for osteoporosis and fractures, ask your health care provider if you should be screened.  Ask your health care provider whether you should take a calcium or vitamin D supplement to lower your risk for osteoporosis.  Menopause may have certain physical symptoms and risks.  Hormone replacement therapy may reduce some of these symptoms and risks. Talk to your health care provider about whether hormone replacement therapy is right for you.  HOME CARE INSTRUCTIONS   Schedule regular health, dental, and eye exams.  Stay current with your immunizations.   Do not use any tobacco products including cigarettes, chewing tobacco, or electronic cigarettes.  If you are pregnant, do not drink alcohol.  If you are breastfeeding, limit how  much and how often you drink alcohol.  Limit alcohol intake to no more than 1 drink per day for nonpregnant women. One drink equals 12 ounces of beer, 5 ounces of wine, or 1 ounces of hard liquor.  Do not use street drugs.  Do not share needles.  Ask your health care provider for help if you need support or information about quitting drugs.  Tell your health care provider if you often feel depressed.  Tell your health care provider if you have ever been abused or do not feel safe at home.   This information is not intended to replace advice given to you by your health care provider. Make sure you discuss any questions you have with your health care provider.   Document Released: 03/05/2011 Document Revised: 09/10/2014 Document Reviewed: 07/22/2013 Elsevier Interactive Patient Education [employer].

## 2015-12-06 DIAGNOSIS — E782 Mixed hyperlipidemia: Secondary | ICD-10-CM | POA: Diagnosis not present

## 2015-12-06 DIAGNOSIS — E1165 Type 2 diabetes mellitus with hyperglycemia: Secondary | ICD-10-CM | POA: Diagnosis not present

## 2015-12-06 DIAGNOSIS — E559 Vitamin D deficiency, unspecified: Secondary | ICD-10-CM | POA: Diagnosis not present

## 2016-01-06 ENCOUNTER — Ambulatory Visit: Payer: BLUE CROSS/BLUE SHIELD | Admitting: Internal Medicine

## 2016-01-09 ENCOUNTER — Ambulatory Visit: Payer: BLUE CROSS/BLUE SHIELD | Admitting: Internal Medicine

## 2016-02-13 DIAGNOSIS — H43811 Vitreous degeneration, right eye: Secondary | ICD-10-CM | POA: Diagnosis not present

## 2016-04-13 ENCOUNTER — Encounter: Payer: Self-pay | Admitting: Gastroenterology

## 2016-04-23 ENCOUNTER — Encounter: Payer: Self-pay | Admitting: Gastroenterology

## 2016-08-06 ENCOUNTER — Encounter: Payer: Self-pay | Admitting: Internal Medicine

## 2016-08-06 ENCOUNTER — Ambulatory Visit (INDEPENDENT_AMBULATORY_CARE_PROVIDER_SITE_OTHER): Payer: BLUE CROSS/BLUE SHIELD | Admitting: Internal Medicine

## 2016-08-06 VITALS — BP 110/86 | Temp 98.2°F | Wt 211.8 lb

## 2016-08-06 DIAGNOSIS — Z23 Encounter for immunization: Secondary | ICD-10-CM

## 2016-08-06 DIAGNOSIS — IMO0001 Reserved for inherently not codable concepts without codable children: Secondary | ICD-10-CM

## 2016-08-06 DIAGNOSIS — E1165 Type 2 diabetes mellitus with hyperglycemia: Secondary | ICD-10-CM

## 2016-08-06 DIAGNOSIS — E785 Hyperlipidemia, unspecified: Secondary | ICD-10-CM | POA: Diagnosis not present

## 2016-08-06 DIAGNOSIS — F988 Other specified behavioral and emotional disorders with onset usually occurring in childhood and adolescence: Secondary | ICD-10-CM | POA: Diagnosis not present

## 2016-08-06 DIAGNOSIS — E669 Obesity, unspecified: Secondary | ICD-10-CM

## 2016-08-06 LAB — POCT GLYCOSYLATED HEMOGLOBIN (HGB A1C): Hemoglobin A1C: 12.6

## 2016-08-06 MED ORDER — CANAGLIFLOZIN 300 MG PO TABS: 300.0000 mg | ORAL_TABLET | Freq: Every day | ORAL | 1 refills | Status: DC

## 2016-08-06 MED ORDER — BASAGLAR KWIKPEN 100 UNIT/ML ~~LOC~~ SOPN: 10.0000 [IU] | PEN_INJECTOR | Freq: Every day | SUBCUTANEOUS | 0 refills | Status: DC

## 2016-08-06 MED ORDER — EXENATIDE ER 2 MG ~~LOC~~ PEN: 2.0000 mg | PEN_INJECTOR | SUBCUTANEOUS | 1 refills | Status: DC

## 2016-08-06 MED ORDER — PIOGLITAZONE HCL 30 MG PO TABS: 30.0000 mg | ORAL_TABLET | Freq: Every day | ORAL | 1 refills | Status: DC
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
## 2016-08-06 NOTE — Patient Instructions (Addendum)
Your diabetes is dangerously out of control .  Fluids   And no sugar beverages .   Back on meds   Agree   Will do a referral  But you have to decide to take meds .   In the short run    We can   add insulin   until better . Readings are above 350 then begin basaglar a 24 hours insuline  10 units per day . In addition to your other meds  Can send in readings until you see endo  Contact Gi dept about fu  Gi issues fissure cp poss reflux etc.   We will check hep c and hiv at next blood draw    ROV in  2 months  Labs pre visit  Lipids  Hep c hiv  Bmp  Unless done elsewhere    Wt Readings from Last 3 Encounters:  08/06/16 211 lb 12.8 oz (96.1 kg)  12/05/15 224 lb 1.6 oz (101.7 kg)  07/11/15 237 lb 6.4 oz (107.7 kg)   BP Readings from Last 3 Encounters:  08/06/16 110/86  12/05/15 126/78  07/11/15 126/82

## 2016-08-06 NOTE — Addendum Note (Signed)
Addended by: Miles Costain T on: 08/06/2016 02:17 PM   Modules accepted: Orders

## 2016-08-09 ENCOUNTER — Telehealth: Payer: Self-pay | Admitting: Internal Medicine

## 2016-08-09 NOTE — Telephone Encounter (Signed)
° ° °  Pt call to say that her insurance will not cover the following med   BYDUREON 2 MG SUSR  She spoke with her insurance company and they will cover the below med. Pt would like a call back to discuss.    TRULICITY     [id number]

## 2016-08-10 MED ORDER — DULAGLUTIDE 0.75 MG/0.5ML ~~LOC~~ SOAJ: 1.0000 "pen " | SUBCUTANEOUS | 0 refills | Status: DC

## 2016-08-10 MED ORDER — LIRAGLUTIDE 18 MG/3ML ~~LOC~~ SOPN: PEN_INJECTOR | SUBCUTANEOUS | 0 refills | Status: DC

## 2016-08-10 NOTE — Addendum Note (Signed)
Addended by: Miles Costain T on: 08/10/2016 03:58 PM   Modules accepted: Orders

## 2016-08-10 NOTE — Telephone Encounter (Signed)
I am ok with her taking trulicity   Until she sees endocrinologist  See if can give her a sample  It is still once a week

## 2016-08-10 NOTE — Telephone Encounter (Signed)
Spoke to the pt and she will pick up at the front desk.  Left directions.

## 2016-09-04 ENCOUNTER — Other Ambulatory Visit: Payer: Self-pay

## 2016-09-04 ENCOUNTER — Encounter: Payer: Self-pay | Admitting: Internal Medicine

## 2016-09-04 ENCOUNTER — Ambulatory Visit (INDEPENDENT_AMBULATORY_CARE_PROVIDER_SITE_OTHER): Payer: BLUE CROSS/BLUE SHIELD | Admitting: Internal Medicine

## 2016-09-04 VITALS — BP 118/72 | HR 68 | Ht 64.0 in | Wt 217.0 lb

## 2016-09-04 DIAGNOSIS — E1165 Type 2 diabetes mellitus with hyperglycemia: Secondary | ICD-10-CM

## 2016-09-04 DIAGNOSIS — Z794 Long term (current) use of insulin: Secondary | ICD-10-CM

## 2016-09-04 MED ORDER — INSULIN PEN NEEDLE 32G X 4 MM MISC: 3 refills | Status: DC

## 2016-09-04 MED ORDER — INSULIN GLARGINE 300 UNIT/ML ~~LOC~~ SOPN: 12.0000 "pen " | PEN_INJECTOR | Freq: Every day | SUBCUTANEOUS | 3 refills | Status: DC

## 2016-09-04 MED ORDER — INSULIN GLARGINE 300 UNIT/ML ~~LOC~~ SOPN: 12.0000 [IU] | PEN_INJECTOR | Freq: Every day | SUBCUTANEOUS | 3 refills | Status: DC

## 2016-09-04 MED ORDER — BAYER MICROLET LANCETS MISC: 3 refills | Status: DC

## 2016-09-04 MED ORDER — GLUCOSE BLOOD VI STRP: ORAL_STRIP | 3 refills | Status: DC

## 2016-09-04 MED ORDER — DULAGLUTIDE 1.5 MG/0.5ML ~~LOC~~ SOAJ: SUBCUTANEOUS | 3 refills | Status: DC

## 2016-09-04 NOTE — Patient Instructions (Addendum)
Please continue: - Invokana 300 mg daily before the first meal of the day - Actos 30 mg daily before the first meal of the day  Please increase: - Trulicity 1.5 mg weekly  Please add: - Toujeo 12 units at bedtime. If the sugars in am are not <150 in 3 days, please increase to 15 units. If the sugars in am are not <150 in 3 days, please increase to 18 units. You may continue to increase until you reach 27 units.  Please return in 1.5 months with your sugar log.   Please let me know if the sugars are consistently <80 or >200.  PATIENT INSTRUCTIONS FOR TYPE 2 DIABETES:  DIET AND EXERCISE Diet and exercise is an important part of diabetic treatment.  We recommended aerobic exercise in the form of brisk walking (working between 40-60% of maximal aerobic capacity, similar to brisk walking) for 150 minutes per week (such as 30 minutes five days per week) along with 3 times per week performing 'resistance' training (using various gauge rubber tubes with handles) 5-10 exercises involving the major muscle groups (upper body, lower body and core) performing 10-15 repetitions (or near fatigue) each exercise. Start at half the above goal but build slowly to reach the above goals. If limited by weight, joint pain, or disability, we recommend daily walking in a swimming pool with water up to waist to reduce pressure from joints while allow for adequate exercise.    BLOOD GLUCOSES Monitoring your blood glucoses is important for continued management of your diabetes. Please check your blood glucoses 2-4 times a day: fasting, before meals and at bedtime (you can rotate these measurements - e.g. one day check before the 3 meals, the next day check before 2 of the meals and before bedtime, etc.).   HYPOGLYCEMIA (low blood sugar) Hypoglycemia is usually a reaction to not eating, exercising, or taking too much insulin/ other diabetes drugs.  Symptoms include tremors, sweating, hunger, confusion, headache,  etc. Treat IMMEDIATELY with 15 grams of Carbs: . 4 glucose tablets .  cup regular juice/soda . 2 tablespoons raisins . 4 teaspoons sugar . 1 tablespoon honey Recheck blood glucose in 15 mins and repeat above if still symptomatic/blood glucose <100.  RECOMMENDATIONS TO REDUCE YOUR RISK OF DIABETIC COMPLICATIONS: * Take your prescribed MEDICATION(S) * Follow a DIABETIC diet: Complex carbs, fiber rich foods, (monounsaturated and polyunsaturated) fats * AVOID saturated/trans fats, high fat foods, >2,300 mg salt per day. * EXERCISE at least 5 times a week for 30 minutes or preferably daily.  * DO NOT SMOKE OR DRINK more than 1 drink a day. * Check your FEET every day. Do not wear tightfitting shoes. Contact us if you develop an ulcer * See your EYE doctor once a year or more if needed * Get a FLU shot once a year * Get a PNEUMONIA vaccine once before and once after age 11 years  GOALS:  * Your Hemoglobin A1c of <7%  * fasting sugars need to be <130 * after meals sugars need to be <180 (2h after you start eating) * Your Systolic BP should be [id number] or lower  * Your Diastolic BP should be 80 or lower  * Your HDL (Good Cholesterol) should be 40 or higher  * Your LDL (Bad Cholesterol) should be 100 or lower. * Your Triglycerides should be 150 or lower  * Your Urine microalbumin (kidney function) should be <30 * Your Body Mass Index should be 25 or lower  Please consider the following ways to cut down carbs and fat and increase fiber and micronutrients in your diet: - substitute whole grain for white bread or pasta - substitute brown rice for white rice - substitute 90-calorie flat bread pieces for slices of bread when possible - substitute sweet potatoes or yams for white potatoes - substitute humus for margarine - substitute tofu for cheese when possible - substitute almond or rice milk for regular milk (would not drink soy milk daily due to concern for soy estrogen influence on  breast cancer risk) - substitute dark chocolate for other sweets when possible - substitute water - can add lemon or orange slices for taste - for diet sodas (artificial sweeteners will trick your body that you can eat sweets without getting calories and will lead you to overeating and weight gain in the long run) - do not skip breakfast or other meals (this will slow down the metabolism and will result in more weight gain over time)  - can try smoothies made from fruit and almond/rice milk in am instead of regular breakfast - can also try old-fashioned (not instant) oatmeal made with almond/rice milk in am - order the dressing on the side when eating salad at a restaurant (pour less than half of the dressing on the salad) - eat as little meat as possible - can try juicing, but should not forget that juicing will get rid of the fiber, so would alternate with eating raw veg./fruits or drinking smoothies - use as little oil as possible, even when using olive oil - can dress a salad with a mix of balsamic vinegar and lemon juice, for e.g. - use agave nectar, stevia sugar, or regular sugar rather than artificial sweateners - steam or broil/roast veggies  - snack on veggies/fruit/nuts (unsalted, preferably) when possible, rather than processed foods - reduce or eliminate aspartame in diet (it is in diet sodas, chewing gum, etc) Read the labels!  Try to read Dr. Janene Harvey book: "Program for Reversing Diabetes" for other ideas for healthy eating.
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
## 2016-09-07 ENCOUNTER — Telehealth: Payer: Self-pay | Admitting: Internal Medicine

## 2016-09-07 NOTE — Telephone Encounter (Signed)
Johann Capers is calling on he status of PA for Trulicity. Please advise [phone]

## 2016-09-07 NOTE — Telephone Encounter (Incomplete)
Checking for PA now.

## 2016-09-11 ENCOUNTER — Telehealth: Payer: Self-pay

## 2016-09-11 ENCOUNTER — Telehealth: Payer: Self-pay | Admitting: Internal Medicine

## 2016-09-11 NOTE — Telephone Encounter (Signed)
Called and left message for patient advising that PA for trulicity was approved. Gave call back number for questions.

## 2016-09-11 NOTE — Telephone Encounter (Signed)
PA was approved. Patient notified.

## 2016-09-11 NOTE — Telephone Encounter (Signed)
[facility] to check on the status of the [facility] for this patient.

## 2016-09-11 NOTE — Telephone Encounter (Signed)
Pt called in to also check on the status of the PA and would like to speak to the nurse.  She requests call back.

## 2016-09-11 NOTE — Telephone Encounter (Signed)
Sent in PA today, should receive an answer this week. Will notify patient.

## 2016-09-13 ENCOUNTER — Other Ambulatory Visit: Payer: Self-pay

## 2016-09-13 ENCOUNTER — Telehealth: Payer: Self-pay | Admitting: Internal Medicine

## 2016-09-13 MED ORDER — BAYER CONTOUR NEXT TEST VI STRP: ORAL_STRIP | 3 refills | Status: DC

## 2016-09-13 NOTE — Telephone Encounter (Signed)
rx sent

## 2016-09-13 NOTE — Telephone Encounter (Signed)
Pt needs a prescription for the Contour Next Meter [facility] [address]

## 2016-09-17 ENCOUNTER — Other Ambulatory Visit: Payer: Self-pay

## 2016-09-17 MED ORDER — BAYER CONTOUR NEXT MONITOR W/DEVICE KIT: PACK | 0 refills | Status: DC

## 2016-09-27 ENCOUNTER — Other Ambulatory Visit: Payer: Self-pay

## 2016-09-27 ENCOUNTER — Telehealth: Payer: Self-pay | Admitting: Internal Medicine

## 2016-09-27 MED ORDER — BAYER CONTOUR NEXT MONITOR W/DEVICE KIT: PACK | 0 refills | Status: DC

## 2016-09-27 MED ORDER — CANAGLIFLOZIN 300 MG PO TABS: 300.0000 mg | ORAL_TABLET | Freq: Every day | ORAL | 1 refills | Status: DC

## 2016-09-27 MED ORDER — PIOGLITAZONE HCL 30 MG PO TABS: ORAL_TABLET | ORAL | 2 refills | Status: DC

## 2016-09-27 NOTE — Telephone Encounter (Signed)
°  Pt request refill of the following:  pioglitazone (ACTOS) 30 MG tablet  atorvastatin (LIPITOR) 20 MG tablet  escitalopram (LEXAPRO) 10 MG tablet  esomeprazole (NEXIUM) 40 MG capsule           [facility] [address]

## 2016-09-27 NOTE — Telephone Encounter (Signed)
Called and spoke with patient and submitted the rx's that she was needing to the pharmacy. Patient had no questions.

## 2016-09-27 NOTE — Telephone Encounter (Signed)
° ° ° ° °  Pt said she call Endo to rx the below med so she want need to Dr Regis Bill rx   Pioglitazone

## 2016-09-27 NOTE — Telephone Encounter (Signed)
error 

## 2016-09-27 NOTE — Telephone Encounter (Signed)
Pt request refill of the following:  atorvastatin (LIPITOR) 20 MG tablet                    Demographics   Paige Hernandez 61 year old female 1956-01-13 Comm Pref:   [place] [address] [phone] (M)   Advanced Directives   No data recorded   Problem List  Collapse by Default  Collapse by Default       Type 2 diabetes mellitus with hyperglycemia, with long-term current use of insulin (HCC)  Hyperlipidemia  OBESITY  ANEMIA-NOS  ADJ DISORDER WITH MIXED ANXIETY & DEPRESSED MOOD  Attention deficit disorder  ALLERGIC RHINITIS  GERD  FATTY LIVER DISEASE  MENOPAUSE-RELATED VASOMOTOR SYMPTOMS  EDEMA  HEADACHE  ABDOMINAL PAIN, GENERALIZED  ADVERSE REACTION TO MEDICATION  COLONIC POLYPS, HX OF  ANAL FISSURE, HX OF  Encounter for preventive health examination  Increased urinary frequency  Intertrigo  Fissure in ano  Preventative health care  Encounter for routine gynecological examination  Expand to view 22 more items    Mark as Reviewed Medications (Reviewed by LPN on 4/0/9811)     Health Maintenance    1955-12-13 Hepatitis C Screening    07/24/1969 HIV Screening    04/27/2016 PAP SMEAR    07/14/2016 OPHTHALMOLOGY EXAM    07/16/2016 COLONOSCOPY    08/05/2017 ZOSTAVAX    11/27/2016 URINE MICROALBUMIN    12/04/2016 FOOT EXAM    02/04/2017 HEMOGLOBIN A1C    04/29/2017 PNEUMOCOCCAL POLYSACCHARIDE VACCINE (2)    06/13/2017 MAMMOGRAM    04/28/2023 TETANUS/TDAP    Reminders and Results   None      Care Team and Communications   No referring provider set    PCPs Type  Burnis Medin, MD General  Other Patient Care Team Members Relationship  Lorne Skeens, MD Consulting Physician  Lafayette Dragon, MD (Inactive) Not specified  Heather [place], [place] Not specified  Recipients of Past 2 Communications   Office Visit - 09/04/2016    Burnis Medin, MD 09/04/2016 In Basket  Office Visit - 07/23/2014      Frederik Pear, MD 07/23/2014 Fax    Allergies      No Known Allergies   Medications    atorvastatin (LIPITOR) 20 MG tablet    BAYER CONTOUR NEXT TEST test strip    BAYER MICROLET LANCETS lancets    Blood Glucose Monitoring Suppl (BAYER CONTOUR NEXT MONITOR) w/Device KIT    canagliflozin (INVOKANA) 300 MG TABS tablet    Cholecalciferol (VITAMIN D PO)    Dulaglutide (TRULICITY) 1.5 BJ/4.7WG SOPN    escitalopram (LEXAPRO) 10 MG tablet    esomeprazole (NEXIUM) 40 MG capsule    Famotidine (PEPCID PO)    Insulin Glargine (TOUJEO SOLOSTAR) 300 UNIT/ML SOPN    Insulin Pen Needle (CAREFINE PEN NEEDLES) 32G X 4 MM MISC    Lidocaine-Hydrocortisone Ace (PERANEX HC) 3-1 % PADS    Lidocaine-Hydrocortisone Ace 2-2 % KIT    loperamide (IMODIUM A-D) 2 MG tablet    pioglitazone (ACTOS) 30 MG tablet    Mark as Reviewed Reviewed by LPN on 05/08/6212   Preferred Pharmacies      [id number] [place] [place] [facility] [address] [phone] (Phone) [phone] (Fax)  [place], [place] [phone] (Phone) [phone] (Fax)  [facility] [place], [place] - [place] [phone] (Phone) [phone] (Fax)  Immunizations/Injections   Hep A / Hep B4/28/2014, 07/29/2012, 04/29/2012   Influenza Split12/18/2014   Influenza Whole1/03/2010, 05/26/2008   Influenza,inj,Quad PF,36+ Mos12/12/2015, 07/11/2015, 05/28/2014   Pneumococcal Polysaccharide-238/27/2013   Td1/09/2002   Tdap8/25/2014    Significant History/Details   Smoking: Never Smoker  Smokeless Tobacco: Unknown  Alcohol: No  7 open orders  Preferred Language: English   Specialty Comments Show AllReport  No comments regarding your specialty   Family Comments   None                        Oxygen Therapy    09/04/16 04/27/13 04/29/12  SpO2 97 % 97 % 98 %                                                                                                                                                                                                                                                                                                                                          My Last Outpatient Progress Note   There are no Outpatient notes of this type for this patient            Phamacy:

## 2016-09-27 NOTE — Telephone Encounter (Signed)
Refill canagliflozin (INVOKANA) 300 MG TABS tablet pioglitazone (ACTOS) 30 MG tablet   Prescription for BAYER CONTOUR NEXT TEST test strip  [facility] [address] [place], [place] [phone] (Phone) [phone] (Fax)

## 2016-09-28 MED ORDER — ATORVASTATIN CALCIUM 20 MG PO TABS: 20.0000 mg | ORAL_TABLET | Freq: Every day | ORAL | 0 refills | Status: DC

## 2016-09-28 MED ORDER — ESCITALOPRAM OXALATE 10 MG PO TABS: 10.0000 mg | ORAL_TABLET | Freq: Every day | ORAL | 0 refills | Status: DC

## 2016-09-28 MED ORDER — PIOGLITAZONE HCL 30 MG PO TABS: ORAL_TABLET | ORAL | 2 refills | Status: DC

## 2016-09-28 MED ORDER — ESOMEPRAZOLE MAGNESIUM 40 MG PO CPDR: 40.0000 mg | DELAYED_RELEASE_CAPSULE | Freq: Two times a day (BID) | ORAL | 0 refills | Status: DC

## 2016-09-28 NOTE — Telephone Encounter (Signed)
Pt doesn't need the Actos says that her endocrinologist has prescribed that for her. But she does need a refill for the generic Nexium 40 mg, Lexapro 10 mg, and Lipitor 20 mg. Okay to refill?

## 2016-09-28 NOTE — Telephone Encounter (Signed)
Okay to fill? 

## 2016-09-28 NOTE — Telephone Encounter (Signed)
Yes   Ok to refill these for 3 months

## 2016-09-28 NOTE — Telephone Encounter (Signed)
Refill sent to the pharmacy 

## 2016-09-28 NOTE — Telephone Encounter (Signed)
We can refill  Med for 3 months  So she doesn't run out.  But  Tell her  That  her new endocrinologist  Should eventually take over the diabetes medication

## 2016-10-02 ENCOUNTER — Other Ambulatory Visit (INDEPENDENT_AMBULATORY_CARE_PROVIDER_SITE_OTHER): Payer: BLUE CROSS/BLUE SHIELD

## 2016-10-02 DIAGNOSIS — Z0000 Encounter for general adult medical examination without abnormal findings: Secondary | ICD-10-CM | POA: Diagnosis not present

## 2016-10-02 DIAGNOSIS — Z729 Problem related to lifestyle, unspecified: Secondary | ICD-10-CM | POA: Diagnosis not present

## 2016-10-02 DIAGNOSIS — Z114 Encounter for screening for human immunodeficiency virus [HIV]: Secondary | ICD-10-CM | POA: Diagnosis not present

## 2016-10-02 DIAGNOSIS — K760 Fatty (change of) liver, not elsewhere classified: Secondary | ICD-10-CM

## 2016-10-02 DIAGNOSIS — E785 Hyperlipidemia, unspecified: Secondary | ICD-10-CM

## 2016-10-02 LAB — LIPID PANEL
CHOL/HDL RATIO: 3
CHOLESTEROL: 155 mg/dL (ref 0–200)
HDL: 61 mg/dL (ref >39.00)
LDL CALC: 77 mg/dL (ref 0–99)
NONHDL: 94.08
Triglycerides: 83 mg/dL (ref 0.0–149.0)
VLDL: 16.6 mg/dL (ref 0.0–40.0)

## 2016-10-02 LAB — BASIC METABOLIC PANEL
BUN: 22 mg/dL (ref 6–23)
CALCIUM: 8.9 mg/dL (ref 8.4–10.5)
CO2: 31 mEq/L (ref 19–32)
Chloride: 105 mEq/L (ref 96–112)
Creatinine, Ser: 1.16 mg/dL (ref 0.40–1.20)
GFR: 50.28 mL/min — AB (ref >60.00)
GLUCOSE: 112 mg/dL — AB (ref 70–99)
POTASSIUM: 4.4 meq/L (ref 3.5–5.1)
SODIUM: 141 meq/L (ref 135–145)

## 2016-10-03 ENCOUNTER — Other Ambulatory Visit: Payer: Self-pay | Admitting: Family Medicine

## 2016-10-03 LAB — HIV ANTIBODY (ROUTINE TESTING W REFLEX): HIV 1&2 Ab, 4th Generation: NONREACTIVE

## 2016-10-03 LAB — HEPATITIS C ANTIBODY: HCV Ab: NEGATIVE

## 2016-10-03 MED ORDER — PIOGLITAZONE HCL 30 MG PO TABS: ORAL_TABLET | ORAL | 2 refills | Status: DC

## 2016-10-03 NOTE — Telephone Encounter (Signed)
Pharmacy received prescription with directions that stated to take before first meal of the day.  Script did not specify how many tablets.  Resent with new sig to say take 1 tablet before first meal of the day.
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
## 2016-10-08 ENCOUNTER — Ambulatory Visit (INDEPENDENT_AMBULATORY_CARE_PROVIDER_SITE_OTHER): Payer: BLUE CROSS/BLUE SHIELD | Admitting: Internal Medicine

## 2016-10-08 ENCOUNTER — Encounter: Payer: Self-pay | Admitting: Internal Medicine

## 2016-10-08 VITALS — BP 100/76 | Temp 97.4°F | Wt 221.0 lb

## 2016-10-08 DIAGNOSIS — E785 Hyperlipidemia, unspecified: Secondary | ICD-10-CM

## 2016-10-08 DIAGNOSIS — E1165 Type 2 diabetes mellitus with hyperglycemia: Secondary | ICD-10-CM | POA: Diagnosis not present

## 2016-10-08 DIAGNOSIS — Z8719 Personal history of other diseases of the digestive system: Secondary | ICD-10-CM

## 2016-10-08 DIAGNOSIS — I10 Essential (primary) hypertension: Secondary | ICD-10-CM

## 2016-10-08 DIAGNOSIS — Z79899 Other long term (current) drug therapy: Secondary | ICD-10-CM

## 2016-10-08 DIAGNOSIS — E669 Obesity, unspecified: Secondary | ICD-10-CM

## 2016-10-08 DIAGNOSIS — F988 Other specified behavioral and emotional disorders with onset usually occurring in childhood and adolescence: Secondary | ICD-10-CM

## 2016-10-08 DIAGNOSIS — IMO0001 Reserved for inherently not codable concepts without codable children: Secondary | ICD-10-CM

## 2016-10-08 MED ORDER — LIDOCAINE (ANORECTAL) 5 % EX CREA: TOPICAL_CREAM | CUTANEOUS | 2 refills | Status: DC

## 2016-10-08 NOTE — Patient Instructions (Addendum)
Keep appt with  Endo  To  get things under control  Continue same meds .  Let us know if you want to go back on   adhd medication .  Uncertain if the hot flushes  Are important or medication and may go away.   See gi about the hemorrhoid fissure  Other treatment may be recommended .  ASA is advised in diabetes but   Only if bneefot more than risk  ( bleeding )   81 mg   alos can ask dr Renne Crigler   . Sugar   bp  And lipid control may be more important .   Plan ROV in 4-6 months  To assess other needs that need FU.

## 2016-10-18 ENCOUNTER — Encounter: Payer: Self-pay | Admitting: Internal Medicine

## 2016-10-18 ENCOUNTER — Ambulatory Visit (INDEPENDENT_AMBULATORY_CARE_PROVIDER_SITE_OTHER): Payer: BLUE CROSS/BLUE SHIELD | Admitting: Internal Medicine

## 2016-10-18 VITALS — BP 118/80 | HR 76 | Ht 64.0 in | Wt 230.0 lb

## 2016-10-18 DIAGNOSIS — Z794 Long term (current) use of insulin: Secondary | ICD-10-CM | POA: Diagnosis not present

## 2016-10-18 DIAGNOSIS — E1165 Type 2 diabetes mellitus with hyperglycemia: Secondary | ICD-10-CM

## 2016-10-18 MED ORDER — INSULIN GLARGINE 300 UNIT/ML ~~LOC~~ SOPN: 27.0000 [IU] | PEN_INJECTOR | Freq: Every day | SUBCUTANEOUS | 3 refills | Status: DC

## 2016-10-18 MED ORDER — DULAGLUTIDE 1.5 MG/0.5ML ~~LOC~~ SOAJ: SUBCUTANEOUS | 3 refills | Status: DC

## 2016-10-18 NOTE — Patient Instructions (Addendum)
Please continue: - Invokana 300 mg daily before the first meal of the day - Actos 30 mg daily before the first meal of the day - Trulicity 1.5 mg weekly - Toujeo 27 units at bedtime.  KEEP UP THE GREAT JOB!  Please return in 2 months with your sugar log.
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
## 2016-10-19 DIAGNOSIS — Z1231 Encounter for screening mammogram for malignant neoplasm of breast: Secondary | ICD-10-CM | POA: Diagnosis not present

## 2016-10-19 LAB — HM MAMMOGRAPHY

## 2016-10-26 ENCOUNTER — Encounter: Payer: Self-pay | Admitting: Family Medicine

## 2016-11-26 DIAGNOSIS — M9901 Segmental and somatic dysfunction of cervical region: Secondary | ICD-10-CM | POA: Diagnosis not present

## 2016-11-26 DIAGNOSIS — M9902 Segmental and somatic dysfunction of thoracic region: Secondary | ICD-10-CM | POA: Diagnosis not present

## 2016-11-26 DIAGNOSIS — M5032 Other cervical disc degeneration, mid-cervical region, unspecified level: Secondary | ICD-10-CM | POA: Diagnosis not present

## 2016-11-26 DIAGNOSIS — M4312 Spondylolisthesis, cervical region: Secondary | ICD-10-CM | POA: Diagnosis not present

## 2016-12-17 ENCOUNTER — Encounter: Payer: Self-pay | Admitting: Internal Medicine

## 2016-12-17 ENCOUNTER — Ambulatory Visit (INDEPENDENT_AMBULATORY_CARE_PROVIDER_SITE_OTHER): Payer: BLUE CROSS/BLUE SHIELD | Admitting: Internal Medicine

## 2016-12-17 VITALS — BP 102/64 | HR 70 | Temp 97.4°F | Ht 64.0 in | Wt 232.8 lb

## 2016-12-17 DIAGNOSIS — Z794 Long term (current) use of insulin: Secondary | ICD-10-CM

## 2016-12-17 DIAGNOSIS — E119 Type 2 diabetes mellitus without complications: Secondary | ICD-10-CM

## 2016-12-17 DIAGNOSIS — E1165 Type 2 diabetes mellitus with hyperglycemia: Secondary | ICD-10-CM | POA: Diagnosis not present

## 2016-12-17 LAB — POCT GLYCOSYLATED HEMOGLOBIN (HGB A1C): HEMOGLOBIN A1C: 5.7

## 2016-12-17 MED ORDER — PIOGLITAZONE HCL 15 MG PO TABS: 15.0000 mg | ORAL_TABLET | Freq: Every day | ORAL | 5 refills | Status: DC

## 2016-12-17 NOTE — Patient Instructions (Addendum)
Please continue: - Invokana 300 mg daily before the first meal of the day - Trulicity 1.5 mg weekly - Toujeo 27 units at bedtime.  Please decrease Actos to 15 mg daily. If sugars remain as good as now in 2 weeks, please decrease the dose to 7.5 mg for another 2 weeks and then stop.   Please return in 3 months with your sugar log.
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
## 2016-12-25 ENCOUNTER — Other Ambulatory Visit: Payer: Self-pay | Admitting: Internal Medicine

## 2016-12-26 DIAGNOSIS — E119 Type 2 diabetes mellitus without complications: Secondary | ICD-10-CM | POA: Diagnosis not present

## 2017-01-26 ENCOUNTER — Other Ambulatory Visit: Payer: Self-pay | Admitting: Internal Medicine

## 2017-01-29 NOTE — Telephone Encounter (Signed)
Sent to the pharmacy by e-scribe for 90 days.  Pt has upcoming cpx on 02/12/17.

## 2017-02-05 ENCOUNTER — Other Ambulatory Visit: Payer: BLUE CROSS/BLUE SHIELD
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
## 2017-02-12 ENCOUNTER — Ambulatory Visit (INDEPENDENT_AMBULATORY_CARE_PROVIDER_SITE_OTHER): Payer: BLUE CROSS/BLUE SHIELD | Admitting: Internal Medicine

## 2017-02-12 ENCOUNTER — Encounter: Payer: Self-pay | Admitting: Internal Medicine

## 2017-02-12 ENCOUNTER — Other Ambulatory Visit (HOSPITAL_COMMUNITY)
Admission: RE | Admit: 2017-02-12 | Discharge: 2017-02-12 | Disposition: A | Discharge status: Home / Self Care | Payer: BLUE CROSS/BLUE SHIELD | Source: Ambulatory Visit | Attending: Internal Medicine | Admitting: Internal Medicine

## 2017-02-12 VITALS — BP 102/60 | Temp 98.0°F | Ht 64.0 in | Wt 236.0 lb

## 2017-02-12 DIAGNOSIS — Z01419 Encounter for gynecological examination (general) (routine) without abnormal findings: Secondary | ICD-10-CM | POA: Diagnosis not present

## 2017-02-12 DIAGNOSIS — E1165 Type 2 diabetes mellitus with hyperglycemia: Secondary | ICD-10-CM

## 2017-02-12 DIAGNOSIS — I10 Essential (primary) hypertension: Secondary | ICD-10-CM | POA: Diagnosis not present

## 2017-02-12 DIAGNOSIS — Z0000 Encounter for general adult medical examination without abnormal findings: Secondary | ICD-10-CM

## 2017-02-12 DIAGNOSIS — Z794 Long term (current) use of insulin: Secondary | ICD-10-CM

## 2017-02-12 DIAGNOSIS — E669 Obesity, unspecified: Secondary | ICD-10-CM | POA: Diagnosis not present

## 2017-02-12 DIAGNOSIS — IMO0001 Reserved for inherently not codable concepts without codable children: Secondary | ICD-10-CM

## 2017-02-12 DIAGNOSIS — Z23 Encounter for immunization: Secondary | ICD-10-CM

## 2017-02-12 DIAGNOSIS — E785 Hyperlipidemia, unspecified: Secondary | ICD-10-CM

## 2017-02-12 DIAGNOSIS — Z79899 Other long term (current) drug therapy: Secondary | ICD-10-CM

## 2017-02-12 DIAGNOSIS — F988 Other specified behavioral and emotional disorders with onset usually occurring in childhood and adolescence: Secondary | ICD-10-CM

## 2017-02-12 LAB — HEPATIC FUNCTION PANEL
ALT: 13 U/L (ref 0–35)
AST: 13 U/L (ref 0–37)
Albumin: 4.1 g/dL (ref 3.5–5.2)
Alkaline Phosphatase: 65 U/L (ref 39–117)
BILIRUBIN DIRECT: 0.1 mg/dL (ref 0.0–0.3)
TOTAL PROTEIN: 6.7 g/dL (ref 6.0–8.3)
Total Bilirubin: 0.5 mg/dL (ref 0.2–1.2)

## 2017-02-12 LAB — BASIC METABOLIC PANEL
BUN: 22 mg/dL (ref 6–23)
CHLORIDE: 108 meq/L (ref 96–112)
CO2: 27 mEq/L (ref 19–32)
Calcium: 9.5 mg/dL (ref 8.4–10.5)
Creatinine, Ser: 0.91 mg/dL (ref 0.40–1.20)
GFR: 66.46 mL/min (ref >60.00)
Glucose, Bld: 113 mg/dL — ABNORMAL HIGH (ref 70–99)
POTASSIUM: 4.4 meq/L (ref 3.5–5.1)
Sodium: 142 mEq/L (ref 135–145)

## 2017-02-12 LAB — CBC WITH DIFFERENTIAL/PLATELET
Basophils Absolute: 0 10*3/uL (ref 0.0–0.1)
Basophils Relative: 0.4 % (ref 0.0–3.0)
EOS ABS: 0.2 10*3/uL (ref 0.0–0.7)
EOS PCT: 3.5 % (ref 0.0–5.0)
HEMATOCRIT: 45 % (ref 36.0–46.0)
HEMOGLOBIN: 14.8 g/dL (ref 12.0–15.0)
LYMPHS PCT: 24 % (ref 12.0–46.0)
Lymphs Abs: 1.6 10*3/uL (ref 0.7–4.0)
MCHC: 33 g/dL (ref 30.0–36.0)
MCV: 84.5 fl (ref 78.0–100.0)
Monocytes Absolute: 0.5 10*3/uL (ref 0.1–1.0)
Monocytes Relative: 6.7 % (ref 3.0–12.0)
Neutro Abs: 4.4 10*3/uL (ref 1.4–7.7)
Neutrophils Relative %: 65.4 % (ref 43.0–77.0)
Platelets: 220 10*3/uL (ref 150.0–400.0)
RBC: 5.33 Mil/uL — ABNORMAL HIGH (ref 3.87–5.11)
RDW: 15.2 % (ref 11.5–15.5)
WBC: 6.8 10*3/uL (ref 4.0–10.5)

## 2017-02-12 NOTE — Patient Instructions (Addendum)
Glad you are doing better Continue your diabetes care and attention to diet to help with weight loss that is healthy. Will notify you when pap results are available. Get second shingrix in 2-6 months .  Make sure UTD on  mammo and colon scfreening  Consider exercise classes such as yoga or tai chi to help with balance. Let us know if things are getting worse.  ROV in 6 months med checks     Preventive Care 40-64 Years, Female Preventive care refers to lifestyle choices and visits with your health care provider that can promote health and wellness. What does preventive care include?  A yearly physical exam. This is also called an annual well check.  Dental exams once or twice a year.  Routine eye exams. Ask your health care provider how often you should have your eyes checked.  Personal lifestyle choices, including: ? Daily care of your teeth and gums. ? Regular physical activity. ? Eating a healthy diet. ? Avoiding tobacco and drug use. ? Limiting alcohol use. ? Practicing safe sex. ? Taking low-dose aspirin daily starting at age 35. ? Taking vitamin and mineral supplements as recommended by your health care provider. What happens during an annual well check? The services and screenings done by your health care provider during your annual well check will depend on your age, overall health, lifestyle risk factors, and family history of disease. Counseling Your health care provider may ask you questions about your:  Alcohol use.  Tobacco use.  Drug use.  Emotional well-being.  Home and relationship well-being.  Sexual activity.  Eating habits.  Work and work Statistician.  Method of birth control.  Menstrual cycle.  Pregnancy history.  Screening You may have the following tests or measurements:  Height, weight, and BMI.  Blood pressure.  Lipid and cholesterol levels. These may be checked every 5 years, or more frequently if you are over 59 years  old.  Skin check.  Lung cancer screening. You may have this screening every year starting at age 31 if you have a 30-pack-year history of smoking and currently smoke or have quit within the past 15 years.  Fecal occult blood test (FOBT) of the stool. You may have this test every year starting at age 37.  Flexible sigmoidoscopy or colonoscopy. You may have a sigmoidoscopy every 5 years or a colonoscopy every 10 years starting at age 58.  Hepatitis C blood test.  Hepatitis B blood test.  Sexually transmitted disease (STD) testing.  Diabetes screening. This is done by checking your blood sugar (glucose) after you have not eaten for a while (fasting). You may have this done every 1-3 years.  Mammogram. This may be done every 1-2 years. Talk to your health care provider about when you should start having regular mammograms. This may depend on whether you have a family history of breast cancer.  BRCA-related cancer screening. This may be done if you have a family history of breast, ovarian, tubal, or peritoneal cancers.  Pelvic exam and Pap test. This may be done every 3 years starting at age 40. Starting at age 61, this may be done every 5 years if you have a Pap test in combination with an HPV test.  Bone density scan. This is done to screen for osteoporosis. You may have this scan if you are at high risk for osteoporosis.  Discuss your test results, treatment options, and if necessary, the need for more tests with your health care provider. Vaccines Your  health care provider may recommend certain vaccines, such as:  Influenza vaccine. This is recommended every year.  Tetanus, diphtheria, and acellular pertussis (Tdap, Td) vaccine. You may need a Td booster every 10 years.  Varicella vaccine. You may need this if you have not been vaccinated.  Zoster vaccine. You may need this after age 73.  Measles, mumps, and rubella (MMR) vaccine. You may need at least one dose of MMR if you were  born in 1957 or later. You may also need a second dose.  Pneumococcal 13-valent conjugate (PCV13) vaccine. You may need this if you have certain conditions and were not previously vaccinated.  Pneumococcal polysaccharide (PPSV23) vaccine. You may need one or two doses if you smoke cigarettes or if you have certain conditions.  Meningococcal vaccine. You may need this if you have certain conditions.  Hepatitis A vaccine. You may need this if you have certain conditions or if you travel or work in places where you may be exposed to hepatitis A.  Hepatitis B vaccine. You may need this if you have certain conditions or if you travel or work in places where you may be exposed to hepatitis B.  Haemophilus influenzae type b (Hib) vaccine. You may need this if you have certain conditions.  Talk to your health care provider about which screenings and vaccines you need and how often you need them. This information is not intended to replace advice given to you by your health care provider. Make sure you discuss any questions you have with your health care provider. Document Released: 09/16/2015 Document Revised: 05/09/2016 Document Reviewed: 06/21/2015 Elsevier Interactive Patient Education  2017 [employer].

## 2017-02-13 ENCOUNTER — Other Ambulatory Visit: Payer: Self-pay | Admitting: Internal Medicine

## 2017-02-14 LAB — CYTOLOGY - PAP
Diagnosis: NEGATIVE
HPV (WINDOPATH): NOT DETECTED

## 2017-02-14 NOTE — Progress Notes (Signed)
Tell patient PAP is normal. HPV high  risk is negative

## 2017-03-04 ENCOUNTER — Other Ambulatory Visit: Payer: Self-pay | Admitting: Internal Medicine

## 2017-03-05 DIAGNOSIS — H2513 Age-related nuclear cataract, bilateral: Secondary | ICD-10-CM | POA: Diagnosis not present

## 2017-03-05 DIAGNOSIS — H02839 Dermatochalasis of unspecified eye, unspecified eyelid: Secondary | ICD-10-CM | POA: Diagnosis not present

## 2017-03-05 DIAGNOSIS — H18411 Arcus senilis, right eye: Secondary | ICD-10-CM | POA: Diagnosis not present

## 2017-03-05 DIAGNOSIS — H25013 Cortical age-related cataract, bilateral: Secondary | ICD-10-CM | POA: Diagnosis not present

## 2017-04-01 ENCOUNTER — Ambulatory Visit (INDEPENDENT_AMBULATORY_CARE_PROVIDER_SITE_OTHER): Payer: BLUE CROSS/BLUE SHIELD | Admitting: Internal Medicine

## 2017-04-01 ENCOUNTER — Encounter: Payer: Self-pay | Admitting: Internal Medicine

## 2017-04-01 VITALS — BP 120/82 | HR 85 | Ht 63.5 in | Wt 239.0 lb

## 2017-04-01 DIAGNOSIS — E1165 Type 2 diabetes mellitus with hyperglycemia: Secondary | ICD-10-CM

## 2017-04-01 DIAGNOSIS — Z794 Long term (current) use of insulin: Secondary | ICD-10-CM

## 2017-04-01 DIAGNOSIS — E785 Hyperlipidemia, unspecified: Secondary | ICD-10-CM

## 2017-04-01 LAB — POCT GLYCOSYLATED HEMOGLOBIN (HGB A1C): HEMOGLOBIN A1C: 5.8

## 2017-04-01 MED ORDER — INSULIN GLARGINE 300 UNIT/ML ~~LOC~~ SOPN: 30.0000 [IU] | PEN_INJECTOR | Freq: Every day | SUBCUTANEOUS | 3 refills | Status: DC

## 2017-04-01 MED ORDER — CANAGLIFLOZIN 300 MG PO TABS: 300.0000 mg | ORAL_TABLET | Freq: Every day | ORAL | 11 refills | Status: DC
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
## 2017-04-01 NOTE — Addendum Note (Signed)
Addended by: Caprice Beaver T on: 04/01/2017 11:15 AM   Modules accepted: Orders

## 2017-04-01 NOTE — Patient Instructions (Addendum)
Please continue: - Invokana 300 mg daily before the first meal of the day - Trulicity 1.5 mg weekly  Please increase: - Toujeo to 30 units at bedtime.  Please return in 3-4 months with your sugar log.

## 2017-05-06 ENCOUNTER — Other Ambulatory Visit: Payer: Self-pay | Admitting: Internal Medicine

## 2017-05-10 ENCOUNTER — Telehealth: Payer: Self-pay | Admitting: Internal Medicine

## 2017-05-10 NOTE — Telephone Encounter (Signed)
Patient needs to know the best way to dispose of her needles. Call patient to advise.

## 2017-05-10 NOTE — Telephone Encounter (Signed)
Routing to you °

## 2017-05-13 ENCOUNTER — Telehealth: Payer: Self-pay

## 2017-05-13 NOTE — Telephone Encounter (Signed)
Called and advised patient to throw the needles in a milk jug container and throw in the trash. Patient understood, no questions.

## 2017-05-24 ENCOUNTER — Encounter: Payer: Self-pay | Admitting: Internal Medicine

## 2017-06-13 ENCOUNTER — Other Ambulatory Visit: Payer: Self-pay | Admitting: Internal Medicine

## 2017-07-15 DIAGNOSIS — H52202 Unspecified astigmatism, left eye: Secondary | ICD-10-CM | POA: Diagnosis not present

## 2017-07-15 DIAGNOSIS — H2512 Age-related nuclear cataract, left eye: Secondary | ICD-10-CM | POA: Diagnosis not present

## 2017-07-16 DIAGNOSIS — H2511 Age-related nuclear cataract, right eye: Secondary | ICD-10-CM | POA: Diagnosis not present

## 2017-07-23 ENCOUNTER — Other Ambulatory Visit: Payer: Self-pay

## 2017-07-23 MED ORDER — CONTOUR NEXT TEST VI STRP: ORAL_STRIP | 0 refills | Status: DC

## 2017-07-24 ENCOUNTER — Other Ambulatory Visit: Payer: Self-pay

## 2017-07-24 MED ORDER — CONTOUR NEXT TEST VI STRP: ORAL_STRIP | 0 refills | Status: DC

## 2017-07-30 DIAGNOSIS — S0502XA Injury of conjunctiva and corneal abrasion without foreign body, left eye, initial encounter: Secondary | ICD-10-CM | POA: Diagnosis not present

## 2017-08-02 ENCOUNTER — Ambulatory Visit: Payer: BLUE CROSS/BLUE SHIELD | Admitting: Internal Medicine

## 2017-08-02 ENCOUNTER — Encounter: Payer: Self-pay | Admitting: Internal Medicine

## 2017-08-02 VITALS — BP 100/60 | HR 75 | Wt 237.0 lb

## 2017-08-02 DIAGNOSIS — Z794 Long term (current) use of insulin: Secondary | ICD-10-CM | POA: Diagnosis not present

## 2017-08-02 DIAGNOSIS — E785 Hyperlipidemia, unspecified: Secondary | ICD-10-CM

## 2017-08-02 DIAGNOSIS — E1165 Type 2 diabetes mellitus with hyperglycemia: Secondary | ICD-10-CM

## 2017-08-02 LAB — POCT GLYCOSYLATED HEMOGLOBIN (HGB A1C): Hemoglobin A1C: 6.2
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
## 2017-08-02 NOTE — Addendum Note (Signed)
Addended by: Drucilla Schmidt on: 08/02/2017 10:01 AM   Modules accepted: Orders

## 2017-08-02 NOTE — Patient Instructions (Addendum)
Please continue: - Invokana 300 mg daily before the first meal of the day - Trulicity 1.5 mg weekly - Toujeo 30 units at bedtime  If you restart Durazol, increase the Toujeo to 34 units for the duration of the treatment.  Please return in 4 months with your sugar log.
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
## 2017-08-06 ENCOUNTER — Encounter: Payer: Self-pay | Admitting: Internal Medicine

## 2017-08-06 ENCOUNTER — Ambulatory Visit: Payer: BLUE CROSS/BLUE SHIELD | Admitting: Internal Medicine

## 2017-08-06 VITALS — BP 102/74 | HR 88 | Temp 98.4°F | Wt 235.6 lb

## 2017-08-06 DIAGNOSIS — Z794 Long term (current) use of insulin: Secondary | ICD-10-CM | POA: Diagnosis not present

## 2017-08-06 DIAGNOSIS — Z23 Encounter for immunization: Secondary | ICD-10-CM

## 2017-08-06 DIAGNOSIS — E1165 Type 2 diabetes mellitus with hyperglycemia: Secondary | ICD-10-CM | POA: Diagnosis not present

## 2017-08-06 DIAGNOSIS — Z79899 Other long term (current) drug therapy: Secondary | ICD-10-CM | POA: Diagnosis not present

## 2017-08-06 DIAGNOSIS — E785 Hyperlipidemia, unspecified: Secondary | ICD-10-CM

## 2017-08-06 DIAGNOSIS — F988 Other specified behavioral and emotional disorders with onset usually occurring in childhood and adolescence: Secondary | ICD-10-CM

## 2017-08-06 DIAGNOSIS — Z6841 Body Mass Index (BMI) 40.0 and over, adult: Secondary | ICD-10-CM

## 2017-08-06 NOTE — Patient Instructions (Addendum)
Continue lifestyle intervention healthy eating and exercise . And meds   Glad you are  Doing better !.  Due for cholesterol level in February .

## 2017-08-14 ENCOUNTER — Other Ambulatory Visit: Payer: Self-pay

## 2017-08-14 MED ORDER — DULAGLUTIDE 1.5 MG/0.5ML ~~LOC~~ SOAJ: SUBCUTANEOUS | 3 refills | Status: DC

## 2017-08-19 DIAGNOSIS — H2511 Age-related nuclear cataract, right eye: Secondary | ICD-10-CM | POA: Diagnosis not present

## 2017-09-04 ENCOUNTER — Other Ambulatory Visit: Payer: Self-pay | Admitting: Internal Medicine

## 2017-10-04 ENCOUNTER — Other Ambulatory Visit: Payer: Self-pay | Admitting: Internal Medicine

## 2017-10-22 ENCOUNTER — Other Ambulatory Visit: Payer: Self-pay | Admitting: Internal Medicine

## 2017-10-23 ENCOUNTER — Encounter: Payer: Self-pay | Admitting: Gastroenterology

## 2017-11-03 ENCOUNTER — Other Ambulatory Visit: Payer: Self-pay | Admitting: Internal Medicine

## 2017-11-28 ENCOUNTER — Encounter: Payer: Self-pay | Admitting: Internal Medicine

## 2017-11-28 ENCOUNTER — Ambulatory Visit: Payer: BLUE CROSS/BLUE SHIELD | Admitting: Internal Medicine

## 2017-11-28 VITALS — BP 118/84 | HR 78 | Ht 63.5 in | Wt 239.6 lb

## 2017-11-28 DIAGNOSIS — Z794 Long term (current) use of insulin: Secondary | ICD-10-CM | POA: Diagnosis not present

## 2017-11-28 DIAGNOSIS — E1165 Type 2 diabetes mellitus with hyperglycemia: Secondary | ICD-10-CM | POA: Diagnosis not present

## 2017-11-28 DIAGNOSIS — E785 Hyperlipidemia, unspecified: Secondary | ICD-10-CM

## 2017-11-28 LAB — POCT GLYCOSYLATED HEMOGLOBIN (HGB A1C): Hemoglobin A1C: 6.7

## 2017-11-28 MED ORDER — INSULIN GLARGINE 300 UNIT/ML ~~LOC~~ SOPN: 33.0000 [IU] | PEN_INJECTOR | Freq: Every day | SUBCUTANEOUS | 3 refills | Status: DC

## 2017-11-28 MED ORDER — BAYER MICROLET LANCETS MISC: 3 refills | Status: DC

## 2017-11-28 MED ORDER — INSULIN GLARGINE 300 UNIT/ML ~~LOC~~ SOPN: 33.0000 [IU] | PEN_INJECTOR | Freq: Every day | SUBCUTANEOUS | 11 refills | Status: DC
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
## 2017-11-28 NOTE — Patient Instructions (Addendum)
Please continue: - Trulicity 1.5 mg weekly - Invokana 300 mg but move it in am  Please increase: - Toujeo to 33 units at bedtime  Check sugars 1x a day.  Please return in 3 months with your sugar log.

## 2017-11-29 ENCOUNTER — Other Ambulatory Visit: Payer: Self-pay | Admitting: Internal Medicine

## 2017-12-02 ENCOUNTER — Encounter: Payer: Self-pay | Admitting: *Deleted

## 2017-12-05 DIAGNOSIS — M4312 Spondylolisthesis, cervical region: Secondary | ICD-10-CM | POA: Diagnosis not present

## 2017-12-05 DIAGNOSIS — M5032 Other cervical disc degeneration, mid-cervical region, unspecified level: Secondary | ICD-10-CM | POA: Diagnosis not present

## 2017-12-05 DIAGNOSIS — M5412 Radiculopathy, cervical region: Secondary | ICD-10-CM | POA: Diagnosis not present

## 2017-12-05 DIAGNOSIS — M9901 Segmental and somatic dysfunction of cervical region: Secondary | ICD-10-CM | POA: Diagnosis not present

## 2017-12-09 DIAGNOSIS — M5412 Radiculopathy, cervical region: Secondary | ICD-10-CM | POA: Diagnosis not present

## 2017-12-09 DIAGNOSIS — M5032 Other cervical disc degeneration, mid-cervical region, unspecified level: Secondary | ICD-10-CM | POA: Diagnosis not present

## 2017-12-09 DIAGNOSIS — M9901 Segmental and somatic dysfunction of cervical region: Secondary | ICD-10-CM | POA: Diagnosis not present

## 2017-12-09 DIAGNOSIS — M4312 Spondylolisthesis, cervical region: Secondary | ICD-10-CM | POA: Diagnosis not present

## 2017-12-11 DIAGNOSIS — M5412 Radiculopathy, cervical region: Secondary | ICD-10-CM | POA: Diagnosis not present

## 2017-12-11 DIAGNOSIS — M4312 Spondylolisthesis, cervical region: Secondary | ICD-10-CM | POA: Diagnosis not present

## 2017-12-11 DIAGNOSIS — M5032 Other cervical disc degeneration, mid-cervical region, unspecified level: Secondary | ICD-10-CM | POA: Diagnosis not present

## 2017-12-11 DIAGNOSIS — M9901 Segmental and somatic dysfunction of cervical region: Secondary | ICD-10-CM | POA: Diagnosis not present

## 2017-12-12 DIAGNOSIS — M4312 Spondylolisthesis, cervical region: Secondary | ICD-10-CM | POA: Diagnosis not present

## 2017-12-12 DIAGNOSIS — M5412 Radiculopathy, cervical region: Secondary | ICD-10-CM | POA: Diagnosis not present

## 2017-12-12 DIAGNOSIS — M5032 Other cervical disc degeneration, mid-cervical region, unspecified level: Secondary | ICD-10-CM | POA: Diagnosis not present

## 2017-12-12 DIAGNOSIS — M9901 Segmental and somatic dysfunction of cervical region: Secondary | ICD-10-CM | POA: Diagnosis not present

## 2017-12-16 DIAGNOSIS — M5412 Radiculopathy, cervical region: Secondary | ICD-10-CM | POA: Diagnosis not present

## 2017-12-16 DIAGNOSIS — M9901 Segmental and somatic dysfunction of cervical region: Secondary | ICD-10-CM | POA: Diagnosis not present

## 2017-12-16 DIAGNOSIS — M4312 Spondylolisthesis, cervical region: Secondary | ICD-10-CM | POA: Diagnosis not present

## 2017-12-16 DIAGNOSIS — M5032 Other cervical disc degeneration, mid-cervical region, unspecified level: Secondary | ICD-10-CM | POA: Diagnosis not present

## 2017-12-18 DIAGNOSIS — M9901 Segmental and somatic dysfunction of cervical region: Secondary | ICD-10-CM | POA: Diagnosis not present

## 2017-12-18 DIAGNOSIS — M5412 Radiculopathy, cervical region: Secondary | ICD-10-CM | POA: Diagnosis not present

## 2017-12-18 DIAGNOSIS — M4312 Spondylolisthesis, cervical region: Secondary | ICD-10-CM | POA: Diagnosis not present

## 2017-12-18 DIAGNOSIS — M5032 Other cervical disc degeneration, mid-cervical region, unspecified level: Secondary | ICD-10-CM | POA: Diagnosis not present

## 2017-12-19 ENCOUNTER — Ambulatory Visit (AMBULATORY_SURGERY_CENTER): Payer: BLUE CROSS/BLUE SHIELD | Admitting: *Deleted

## 2017-12-19 ENCOUNTER — Other Ambulatory Visit: Payer: Self-pay

## 2017-12-19 ENCOUNTER — Encounter: Payer: Self-pay | Admitting: Gastroenterology

## 2017-12-19 VITALS — Ht 63.5 in | Wt 241.0 lb

## 2017-12-19 DIAGNOSIS — M5412 Radiculopathy, cervical region: Secondary | ICD-10-CM | POA: Diagnosis not present

## 2017-12-19 DIAGNOSIS — M5032 Other cervical disc degeneration, mid-cervical region, unspecified level: Secondary | ICD-10-CM | POA: Diagnosis not present

## 2017-12-19 DIAGNOSIS — M9901 Segmental and somatic dysfunction of cervical region: Secondary | ICD-10-CM | POA: Diagnosis not present

## 2017-12-19 DIAGNOSIS — M4312 Spondylolisthesis, cervical region: Secondary | ICD-10-CM | POA: Diagnosis not present

## 2017-12-19 DIAGNOSIS — Z86010 Personal history of colonic polyps: Secondary | ICD-10-CM

## 2017-12-19 MED ORDER — NA SULFATE-K SULFATE-MG SULF 17.5-3.13-1.6 GM/177ML PO SOLN: 1.0000 | Freq: Once | ORAL | 0 refills | Status: AC

## 2017-12-19 NOTE — Progress Notes (Signed)
No egg or soy allergy known to patient  No issues with past sedation with any surgeries  or procedures, no intubation problems  No diet pills per patient No home 02 use per patient  No blood thinners per patient  Pt denies issues with constipation  No A fib or A flutter  EMMI video sent to pt's e mail  Pt given a PNM $50 coupon in [place] today for Suprep- explained we do not do PA for preps and reasons why

## 2017-12-23 DIAGNOSIS — M9901 Segmental and somatic dysfunction of cervical region: Secondary | ICD-10-CM | POA: Diagnosis not present

## 2017-12-23 DIAGNOSIS — M5032 Other cervical disc degeneration, mid-cervical region, unspecified level: Secondary | ICD-10-CM | POA: Diagnosis not present

## 2017-12-23 DIAGNOSIS — M5412 Radiculopathy, cervical region: Secondary | ICD-10-CM | POA: Diagnosis not present

## 2017-12-23 DIAGNOSIS — M4312 Spondylolisthesis, cervical region: Secondary | ICD-10-CM | POA: Diagnosis not present

## 2017-12-25 DIAGNOSIS — M5412 Radiculopathy, cervical region: Secondary | ICD-10-CM | POA: Diagnosis not present

## 2017-12-25 DIAGNOSIS — M9901 Segmental and somatic dysfunction of cervical region: Secondary | ICD-10-CM | POA: Diagnosis not present

## 2017-12-25 DIAGNOSIS — M4312 Spondylolisthesis, cervical region: Secondary | ICD-10-CM | POA: Diagnosis not present

## 2017-12-25 DIAGNOSIS — M5032 Other cervical disc degeneration, mid-cervical region, unspecified level: Secondary | ICD-10-CM | POA: Diagnosis not present

## 2017-12-26 DIAGNOSIS — M9901 Segmental and somatic dysfunction of cervical region: Secondary | ICD-10-CM | POA: Diagnosis not present

## 2017-12-26 DIAGNOSIS — M4312 Spondylolisthesis, cervical region: Secondary | ICD-10-CM | POA: Diagnosis not present

## 2017-12-26 DIAGNOSIS — M5032 Other cervical disc degeneration, mid-cervical region, unspecified level: Secondary | ICD-10-CM | POA: Diagnosis not present

## 2017-12-26 DIAGNOSIS — M5412 Radiculopathy, cervical region: Secondary | ICD-10-CM | POA: Diagnosis not present

## 2017-12-30 ENCOUNTER — Other Ambulatory Visit: Payer: Self-pay

## 2017-12-30 ENCOUNTER — Encounter: Payer: Self-pay | Admitting: Gastroenterology

## 2017-12-30 ENCOUNTER — Ambulatory Visit (AMBULATORY_SURGERY_CENTER): Payer: BLUE CROSS/BLUE SHIELD | Admitting: Gastroenterology

## 2017-12-30 VITALS — BP 106/58 | HR 70 | Temp 97.3°F | Resp 13 | Ht 63.5 in | Wt 241.0 lb

## 2017-12-30 DIAGNOSIS — Z86010 Personal history of colonic polyps: Secondary | ICD-10-CM

## 2017-12-30 DIAGNOSIS — D120 Benign neoplasm of cecum: Secondary | ICD-10-CM | POA: Diagnosis not present

## 2017-12-30 DIAGNOSIS — K635 Polyp of colon: Secondary | ICD-10-CM | POA: Diagnosis not present

## 2017-12-30 MED ORDER — SODIUM CHLORIDE 0.9 % IV SOLN
500.0000 mL | Freq: Once | INTRAVENOUS | Status: DC
Start: 2017-12-30 — End: 2018-04-25

## 2017-12-30 NOTE — Op Note (Signed)
[facility] Patient Name: Paige Hernandez Procedure Date: 12/30/2017 9:41 AM MRN: [id number] Endoscopist: Mauri Pole , MD Age: 62 Referring MD:  Date of Birth: [birth year] Gender: Female Account #: [id number] Procedure:                Colonoscopy Indications:              High risk colon cancer surveillance: Personal                            history of colonic polyps, High risk colon cancer                            surveillance: Personal history of adenoma less than                            10 mm in size, Last colonoscopy: 2004, Last                            colonoscopy: 2007 Medicines:                Monitored Anesthesia Care Procedure:                Pre-Anesthesia Assessment:                           - Prior to the procedure, a History and Physical                            was performed, and patient medications and                            allergies were reviewed. The patient's tolerance of                            previous anesthesia was also reviewed. The risks                            and benefits of the procedure and the sedation                            options and risks were discussed with the patient.                            All questions were answered, and informed consent                            was obtained. Prior Anticoagulants: The patient has                            taken no previous anticoagulant or antiplatelet                            agents. ASA Grade Assessment: III - A patient with  severe systemic disease. After reviewing the risks                            and benefits, the patient was deemed in                            satisfactory condition to undergo the procedure.                           After obtaining informed consent, the colonoscope                            was passed under direct vision. Throughout the                            procedure, the patient's blood pressure, pulse,  and                            oxygen saturations were monitored continuously. The                            Colonoscope was introduced through the anus and                            advanced to the the cecum, identified by                            appendiceal orifice and ileocecal valve. The                            colonoscopy was performed without difficulty. The                            patient tolerated the procedure well. The quality                            of the bowel preparation was excellent. The                            ileocecal valve, appendiceal orifice, and rectum                            were photographed. Scope In: 9:59:37 AM Scope Out: 10:18:46 AM Scope Withdrawal Time: 0 hours 15 minutes 26 seconds  Total Procedure Duration: 0 hours 19 minutes 9 seconds  Findings:                 The perianal and digital rectal examinations were                            normal.                           A 7 mm polyp was found in the cecum. The polyp was  sessile. The polyp was removed with a cold snare.                            Resection and retrieval were complete.                           Scattered small-mouthed diverticula were found in                            the sigmoid colon and descending colon.                           Non-bleeding internal hemorrhoids were found during                            retroflexion. The hemorrhoids were large. Complications:            No immediate complications. Estimated Blood Loss:     Estimated blood loss was minimal. Impression:               - One 7 mm polyp in the cecum, removed with a cold                            snare. Resected and retrieved.                           - Diverticulosis in the sigmoid colon and in the                            descending colon.                           - Non-bleeding internal hemorrhoids. Recommendation:           - Patient has a contact number available  for                            emergencies. The signs and symptoms of potential                            delayed complications were discussed with the                            patient. Return to normal activities tomorrow.                            Written discharge instructions were provided to the                            patient.                           - Resume previous diet.                           - Continue present medications.                           -  Await pathology results.                           - Repeat colonoscopy in 5-10 years for surveillance                            based on pathology results. Mauri Pole, MD 12/30/2017 10:23:56 AM This report has been signed electronically.

## 2017-12-30 NOTE — Progress Notes (Signed)
Called to room to assist during endoscopic procedure.  Patient ID and intended procedure confirmed with present staff. Received instructions for my participation in the procedure from the performing physician.  

## 2017-12-30 NOTE — Progress Notes (Signed)
Pt's states no medical or surgical changes since previsit or office visit. 

## 2017-12-30 NOTE — Patient Instructions (Addendum)
Impression/Recommmendations:  Polyp handout given to patient. Diverticulosis handout given to patient. Hemorrhoid handout given to patient.  Brochure on hemorrhoidal banding given to patient.  Resume previous diet. Continue present medications.  Repeat colonoscopy in 5-10 years for surveillance based on pathology results.  YOU HAD AN ENDOSCOPIC PROCEDURE TODAY AT [facility]:   Refer to the procedure report that was given to you for any specific questions about what was found during the examination.  If the procedure report does not answer your questions, please call your gastroenterologist to clarify.  If you requested that your care partner not be given the details of your procedure findings, then the procedure report has been included in a sealed envelope for you to review at your convenience later.  YOU SHOULD EXPECT: Some feelings of bloating in the abdomen. Passage of more gas than usual.  Walking can help get rid of the air that was put into your GI tract during the procedure and reduce the bloating. If you had a lower endoscopy (such as a colonoscopy or flexible sigmoidoscopy) you may notice spotting of blood in your stool or on the toilet paper. If you underwent a bowel prep for your procedure, you may not have a normal bowel movement for a few days.  Please Note:  You might notice some irritation and congestion in your nose or some drainage.  This is from the oxygen used during your procedure.  There is no need for concern and it should clear up in a day or so.  SYMPTOMS TO REPORT IMMEDIATELY:   Following lower endoscopy (colonoscopy or flexible sigmoidoscopy):  Excessive amounts of blood in the stool  Significant tenderness or worsening of abdominal pains  Swelling of the abdomen that is new, acute  Fever of 100F or higher  For urgent or emergent issues, a gastroenterologist can be reached at any hour by calling [phone].   DIET:  We do recommend a  small meal at first, but then you may proceed to your regular diet.  Drink plenty of fluids but you should avoid alcoholic beverages for 24 hours.  ACTIVITY:  You should plan to take it easy for the rest of today and you should NOT DRIVE or use heavy machinery until tomorrow (because of the sedation medicines used during the test).    FOLLOW UP: Our staff will call the number listed on your records the next business day following your procedure to check on you and address any questions or concerns that you may have regarding the information given to you following your procedure. If we do not reach you, we will leave a message.  However, if you are feeling well and you are not experiencing any problems, there is no need to return our call.  We will assume that you have returned to your regular daily activities without incident.  If any biopsies were taken you will be contacted by phone or by letter within the next 1-3 weeks.  Please call us at [phone] if you have not heard about the biopsies in 3 weeks.    SIGNATURES/CONFIDENTIALITY: You and/or your care partner have signed paperwork which will be entered into your electronic medical record.  These signatures attest to the fact that that the information above on your After Visit Summary has been reviewed and is understood.  Full responsibility of the confidentiality of this discharge information lies with you and/or your care-partner.

## 2017-12-30 NOTE — Progress Notes (Signed)
Report given to PACU, vss 

## 2017-12-31 ENCOUNTER — Telehealth: Payer: Self-pay | Admitting: *Deleted

## 2017-12-31 DIAGNOSIS — M5412 Radiculopathy, cervical region: Secondary | ICD-10-CM | POA: Diagnosis not present

## 2017-12-31 DIAGNOSIS — M4312 Spondylolisthesis, cervical region: Secondary | ICD-10-CM | POA: Diagnosis not present

## 2017-12-31 DIAGNOSIS — M9901 Segmental and somatic dysfunction of cervical region: Secondary | ICD-10-CM | POA: Diagnosis not present

## 2017-12-31 DIAGNOSIS — M5032 Other cervical disc degeneration, mid-cervical region, unspecified level: Secondary | ICD-10-CM | POA: Diagnosis not present

## 2017-12-31 NOTE — Telephone Encounter (Signed)
  Follow up Call-  Call back number 12/30/2017  Post procedure Call Back phone  # [phone]  Permission to leave phone message Yes  Some recent data might be hidden     Patient questions:  Do you have a fever, pain , or abdominal swelling? No. Pain Score  0 *  Have you tolerated food without any problems? Yes.    Have you been able to return to your normal activities? Yes.    Do you have any questions about your discharge instructions: Diet   No. Medications  No. Follow up visit  No.  Do you have questions or concerns about your Care? No.  Actions: * If pain score is 4 or above: No action needed, pain <4.

## 2018-01-01 DIAGNOSIS — M5412 Radiculopathy, cervical region: Secondary | ICD-10-CM | POA: Diagnosis not present

## 2018-01-01 DIAGNOSIS — M9901 Segmental and somatic dysfunction of cervical region: Secondary | ICD-10-CM | POA: Diagnosis not present

## 2018-01-01 DIAGNOSIS — M4312 Spondylolisthesis, cervical region: Secondary | ICD-10-CM | POA: Diagnosis not present

## 2018-01-01 DIAGNOSIS — M5032 Other cervical disc degeneration, mid-cervical region, unspecified level: Secondary | ICD-10-CM | POA: Diagnosis not present

## 2018-01-02 ENCOUNTER — Encounter: Payer: Self-pay | Admitting: Gastroenterology

## 2018-01-02 DIAGNOSIS — M5032 Other cervical disc degeneration, mid-cervical region, unspecified level: Secondary | ICD-10-CM | POA: Diagnosis not present

## 2018-01-02 DIAGNOSIS — M4312 Spondylolisthesis, cervical region: Secondary | ICD-10-CM | POA: Diagnosis not present

## 2018-01-02 DIAGNOSIS — M5412 Radiculopathy, cervical region: Secondary | ICD-10-CM | POA: Diagnosis not present

## 2018-01-02 DIAGNOSIS — M9901 Segmental and somatic dysfunction of cervical region: Secondary | ICD-10-CM | POA: Diagnosis not present

## 2018-01-03 ENCOUNTER — Telehealth: Payer: Self-pay | Admitting: Gastroenterology

## 2018-01-03 NOTE — Telephone Encounter (Signed)
Pt called inquiring about path results. °

## 2018-01-03 NOTE — Telephone Encounter (Signed)
Read the letter to the patient. Scheduled her first hemorrhoid banding.

## 2018-01-07 DIAGNOSIS — M5032 Other cervical disc degeneration, mid-cervical region, unspecified level: Secondary | ICD-10-CM | POA: Diagnosis not present

## 2018-01-07 DIAGNOSIS — M9901 Segmental and somatic dysfunction of cervical region: Secondary | ICD-10-CM | POA: Diagnosis not present

## 2018-01-07 DIAGNOSIS — M4312 Spondylolisthesis, cervical region: Secondary | ICD-10-CM | POA: Diagnosis not present

## 2018-01-07 DIAGNOSIS — M5412 Radiculopathy, cervical region: Secondary | ICD-10-CM | POA: Diagnosis not present

## 2018-01-09 DIAGNOSIS — M5412 Radiculopathy, cervical region: Secondary | ICD-10-CM | POA: Diagnosis not present

## 2018-01-09 DIAGNOSIS — M4312 Spondylolisthesis, cervical region: Secondary | ICD-10-CM | POA: Diagnosis not present

## 2018-01-09 DIAGNOSIS — M9901 Segmental and somatic dysfunction of cervical region: Secondary | ICD-10-CM | POA: Diagnosis not present

## 2018-01-09 DIAGNOSIS — M5032 Other cervical disc degeneration, mid-cervical region, unspecified level: Secondary | ICD-10-CM | POA: Diagnosis not present

## 2018-01-14 DIAGNOSIS — M9901 Segmental and somatic dysfunction of cervical region: Secondary | ICD-10-CM | POA: Diagnosis not present

## 2018-01-14 DIAGNOSIS — M5412 Radiculopathy, cervical region: Secondary | ICD-10-CM | POA: Diagnosis not present

## 2018-01-14 DIAGNOSIS — M5032 Other cervical disc degeneration, mid-cervical region, unspecified level: Secondary | ICD-10-CM | POA: Diagnosis not present

## 2018-01-14 DIAGNOSIS — M4312 Spondylolisthesis, cervical region: Secondary | ICD-10-CM | POA: Diagnosis not present

## 2018-01-16 DIAGNOSIS — M5412 Radiculopathy, cervical region: Secondary | ICD-10-CM | POA: Diagnosis not present

## 2018-01-16 DIAGNOSIS — M9901 Segmental and somatic dysfunction of cervical region: Secondary | ICD-10-CM | POA: Diagnosis not present

## 2018-01-16 DIAGNOSIS — M4312 Spondylolisthesis, cervical region: Secondary | ICD-10-CM | POA: Diagnosis not present

## 2018-01-16 DIAGNOSIS — M5032 Other cervical disc degeneration, mid-cervical region, unspecified level: Secondary | ICD-10-CM | POA: Diagnosis not present

## 2018-01-20 DIAGNOSIS — M5412 Radiculopathy, cervical region: Secondary | ICD-10-CM | POA: Diagnosis not present

## 2018-01-20 DIAGNOSIS — M9901 Segmental and somatic dysfunction of cervical region: Secondary | ICD-10-CM | POA: Diagnosis not present

## 2018-01-20 DIAGNOSIS — M4312 Spondylolisthesis, cervical region: Secondary | ICD-10-CM | POA: Diagnosis not present

## 2018-01-20 DIAGNOSIS — M5032 Other cervical disc degeneration, mid-cervical region, unspecified level: Secondary | ICD-10-CM | POA: Diagnosis not present

## 2018-01-21 ENCOUNTER — Other Ambulatory Visit: Payer: Self-pay | Admitting: Dermatology

## 2018-01-21 DIAGNOSIS — D229 Melanocytic nevi, unspecified: Secondary | ICD-10-CM | POA: Diagnosis not present

## 2018-01-21 DIAGNOSIS — D485 Neoplasm of uncertain behavior of skin: Secondary | ICD-10-CM | POA: Diagnosis not present

## 2018-01-21 DIAGNOSIS — L821 Other seborrheic keratosis: Secondary | ICD-10-CM | POA: Diagnosis not present

## 2018-01-23 DIAGNOSIS — M4312 Spondylolisthesis, cervical region: Secondary | ICD-10-CM | POA: Diagnosis not present

## 2018-01-23 DIAGNOSIS — M5032 Other cervical disc degeneration, mid-cervical region, unspecified level: Secondary | ICD-10-CM | POA: Diagnosis not present

## 2018-01-23 DIAGNOSIS — M5412 Radiculopathy, cervical region: Secondary | ICD-10-CM | POA: Diagnosis not present

## 2018-01-23 DIAGNOSIS — M9901 Segmental and somatic dysfunction of cervical region: Secondary | ICD-10-CM | POA: Diagnosis not present

## 2018-01-28 DIAGNOSIS — H9313 Tinnitus, bilateral: Secondary | ICD-10-CM | POA: Diagnosis not present

## 2018-01-28 DIAGNOSIS — H903 Sensorineural hearing loss, bilateral: Secondary | ICD-10-CM | POA: Diagnosis not present

## 2018-01-28 DIAGNOSIS — H9209 Otalgia, unspecified ear: Secondary | ICD-10-CM | POA: Diagnosis not present

## 2018-02-07 ENCOUNTER — Other Ambulatory Visit: Payer: Self-pay | Admitting: Internal Medicine

## 2018-02-12 ENCOUNTER — Encounter: Payer: Self-pay | Admitting: Internal Medicine

## 2018-02-12 ENCOUNTER — Ambulatory Visit (INDEPENDENT_AMBULATORY_CARE_PROVIDER_SITE_OTHER): Payer: BLUE CROSS/BLUE SHIELD | Admitting: Internal Medicine

## 2018-02-12 VITALS — BP 100/70 | HR 73 | Temp 98.4°F | Ht 63.5 in | Wt 238.0 lb

## 2018-02-12 DIAGNOSIS — K219 Gastro-esophageal reflux disease without esophagitis: Secondary | ICD-10-CM

## 2018-02-12 DIAGNOSIS — Z6841 Body Mass Index (BMI) 40.0 and over, adult: Secondary | ICD-10-CM

## 2018-02-12 DIAGNOSIS — E1165 Type 2 diabetes mellitus with hyperglycemia: Secondary | ICD-10-CM

## 2018-02-12 DIAGNOSIS — Z79899 Other long term (current) drug therapy: Secondary | ICD-10-CM

## 2018-02-12 DIAGNOSIS — E785 Hyperlipidemia, unspecified: Secondary | ICD-10-CM

## 2018-02-12 DIAGNOSIS — Z0000 Encounter for general adult medical examination without abnormal findings: Secondary | ICD-10-CM

## 2018-02-12 DIAGNOSIS — Z794 Long term (current) use of insulin: Secondary | ICD-10-CM

## 2018-02-12 DIAGNOSIS — F988 Other specified behavioral and emotional disorders with onset usually occurring in childhood and adolescence: Secondary | ICD-10-CM
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
## 2018-02-12 NOTE — Patient Instructions (Addendum)
Pay attention to sleep hygiene.  As possible.  Get back off  Sweet drinks.   May help the  [facility]         Food Choices for Gastroesophageal Reflux Disease, Adult When you have gastroesophageal reflux disease (GERD), the foods you eat and your eating habits are very important. Choosing the right foods can help ease the discomfort of GERD. Consider working with a diet and nutrition specialist (dietitian) to help you make healthy food choices. What general guidelines should I follow? Eating plan  Choose healthy foods low in fat, such as fruits, vegetables, whole grains, low-fat dairy products, and lean meat, fish, and poultry.  Eat frequent, small meals instead of three large meals each day. Eat your meals slowly, in a relaxed setting. Avoid bending over or lying down until 2-3 hours after eating.  Limit high-fat foods such as fatty meats or fried foods.  Limit your intake of oils, butter, and shortening to less than 8 teaspoons each day.  Avoid the following: ? Foods that cause symptoms. These may be different for different people. Keep a food diary to keep track of foods that cause symptoms. ? Alcohol. ? Drinking large amounts of liquid with meals. ? Eating meals during the 2-3 hours before bed.  Cook foods using methods other than frying. This may include baking, grilling, or broiling. Lifestyle   Maintain a healthy weight. Ask your health care provider what weight is healthy for you. If you need to lose weight, work with your health care provider to do so safely.  Exercise for at least 30 minutes on 5 or more days each week, or as told by your health care provider.  Avoid wearing clothes that fit tightly around your waist and chest.  Do not use any products that contain nicotine or tobacco, such as cigarettes and e-cigarettes. If you need help quitting, ask your health care provider.  Sleep with the head of your bed raised. Use a wedge under the mattress or blocks under  the bed frame to raise the head of the bed. What foods are not recommended? The items listed may not be a complete list. Talk with your dietitian about what dietary choices are best for you. Grains Pastries or quick breads with added fat. [place] toast. Vegetables Deep fried vegetables. [place] fries. Any vegetables prepared with added fat. Any vegetables that cause symptoms. For some people this may include tomatoes and tomato products, chili peppers, onions and garlic, and horseradish. Fruits Any fruits prepared with added fat. Any fruits that cause symptoms. For some people this may include citrus fruits, such as oranges, grapefruit, pineapple, and lemons. Meats and other protein foods High-fat meats, such as fatty beef or pork, hot dogs, ribs, ham, sausage, salami and bacon. Fried meat or protein, including fried fish and fried chicken. Nuts and nut butters. Dairy Whole milk and chocolate milk. Sour cream. Cream. Ice cream. Cream cheese. Milk shakes. Beverages Coffee and tea, with or without caffeine. Carbonated beverages. Sodas. Energy drinks. Fruit juice made with acidic fruits (such as orange or grapefruit). Tomato juice. Alcoholic drinks. Fats and oils Butter. Margarine. Shortening. Ghee. Sweets and desserts Chocolate and cocoa. Donuts. Seasoning and other foods Pepper. Peppermint and spearmint. Any condiments, herbs, or seasonings that cause symptoms. For some people, this may include curry, hot sauce, or vinegar-based salad dressings. Summary  When you have gastroesophageal reflux disease (GERD), food and lifestyle choices are very important to help ease the discomfort of GERD.  Eat frequent, small  meals instead of three large meals each day. Eat your meals slowly, in a relaxed setting. Avoid bending over or lying down until 2-3 hours after eating.  Limit high-fat foods such as fatty meat or fried foods. This information is not intended to replace advice given to you by your  health care provider. Make sure you discuss any questions you have with your health care provider. Document Released: 08/20/2005 Document Revised: 08/21/2016 Document Reviewed: 08/21/2016 Elsevier Interactive Patient Education  2018 [employer]. Insomnia Insomnia is a sleep disorder that makes it difficult to fall asleep or to stay asleep. Insomnia can cause tiredness (fatigue), low energy, difficulty concentrating, mood swings, and poor performance at work or school. There are three different ways to classify insomnia:  Difficulty falling asleep.  Difficulty staying asleep.  Waking up too early in the morning.  Any type of insomnia can be long-term (chronic) or short-term (acute). Both are common. Short-term insomnia usually lasts for three months or less. Chronic insomnia occurs at least three times a week for longer than three months. What are the causes? Insomnia may be caused by another condition, situation, or substance, such as:  Anxiety.  Certain medicines.  Gastroesophageal reflux disease (GERD) or other gastrointestinal conditions.  Asthma or other breathing conditions.  Restless legs syndrome, sleep apnea, or other sleep disorders.  Chronic pain.  Menopause. This may include hot flashes.  Stroke.  Abuse of alcohol, tobacco, or illegal drugs.  Depression.  Caffeine.  Neurological disorders, such as Alzheimer disease.  An overactive thyroid (hyperthyroidism).  The cause of insomnia may not be known. What increases the risk? Risk factors for insomnia include:  Gender. Women are more commonly affected than men.  Age. Insomnia is more common as you get older.  Stress. This may involve your professional or personal life.  Income. Insomnia is more common in people with lower income.  Lack of exercise.  Irregular work schedule or night shifts.  Traveling between different time zones.  What are the signs or symptoms? If you have insomnia, trouble  falling asleep or trouble staying asleep is the main symptom. This may lead to other symptoms, such as:  Feeling fatigued.  Feeling nervous about going to sleep.  Not feeling rested in the morning.  Having trouble concentrating.  Feeling irritable, anxious, or depressed.  How is this treated? Treatment for insomnia depends on the cause. If your insomnia is caused by an underlying condition, treatment will focus on addressing the condition. Treatment may also include:  Medicines to help you sleep.  Counseling or therapy.  Lifestyle adjustments.  Follow these instructions at home:  Take medicines only as directed by your health care provider.  Keep regular sleeping and waking hours. Avoid naps.  Keep a sleep diary to help you and your health care provider figure out what could be causing your insomnia. Include: ? When you sleep. ? When you wake up during the night. ? How well you sleep. ? How rested you feel the next day. ? Any side effects of medicines you are taking. ? What you eat and drink.  Make your bedroom a comfortable place where it is easy to fall asleep: ? Put up shades or special blackout curtains to block light from outside. ? Use a white noise machine to block noise. ? Keep the temperature cool.  Exercise regularly as directed by your health care provider. Avoid exercising right before bedtime.  Use relaxation techniques to manage stress. Ask your health care provider to suggest  some techniques that may work well for you. These may include: ? Breathing exercises. ? Routines to release muscle tension. ? Visualizing peaceful scenes.  Cut back on alcohol, caffeinated beverages, and cigarettes, especially close to bedtime. These can disrupt your sleep.  Do not overeat or eat spicy foods right before bedtime. This can lead to digestive discomfort that can make it hard for you to sleep.  Limit screen use before bedtime. This includes: ? Watching TV. ? Using  your smartphone, tablet, and computer.  Stick to a routine. This can help you fall asleep faster. Try to do a quiet activity, brush your teeth, and go to bed at the same time each night.  Get out of bed if you are still awake after 15 minutes of trying to sleep. Keep the lights down, but try reading or doing a quiet activity. When you feel sleepy, go back to bed.  Make sure that you drive carefully. Avoid driving if you feel very sleepy.  Keep all follow-up appointments as directed by your health care provider. This is important. Contact a health care provider if:  You are tired throughout the day or have trouble in your daily routine due to sleepiness.  You continue to have sleep problems or your sleep problems get worse. Get help right away if:  You have serious thoughts about hurting yourself or someone else. This information is not intended to replace advice given to you by your health care provider. Make sure you discuss any questions you have with your health care provider. Document Released: 08/17/2000 Document Revised: 01/20/2016 Document Reviewed: 05/21/2014 Elsevier Interactive Patient Education  [employer].

## 2018-02-13 LAB — BASIC METABOLIC PANEL
BUN / CREAT RATIO: 15 (ref 12–28)
BUN: 14 mg/dL (ref 8–27)
CO2: 20 mmol/L (ref 20–29)
Calcium: 9 mg/dL (ref 8.7–10.3)
Chloride: 104 mmol/L (ref 96–106)
Creatinine, Ser: 0.91 mg/dL (ref 0.57–1.00)
GFR calc Af Amer: 78 mL/min/{1.73_m2} (ref >59)
GFR calc non Af Amer: 67 mL/min/{1.73_m2} (ref >59)
GLUCOSE: 125 mg/dL — AB (ref 65–99)
POTASSIUM: 4.4 mmol/L (ref 3.5–5.2)
SODIUM: 140 mmol/L (ref 134–144)

## 2018-02-13 LAB — CBC WITH DIFFERENTIAL/PLATELET
BASOS: 0 %
Basophils Absolute: 0 10*3/uL (ref 0.0–0.2)
EOS (ABSOLUTE): 0.3 10*3/uL (ref 0.0–0.4)
EOS: 3 %
HEMATOCRIT: 43.3 % (ref 34.0–46.6)
Hemoglobin: 13.5 g/dL (ref 11.1–15.9)
IMMATURE GRANULOCYTES: 0 %
Immature Grans (Abs): 0 10*3/uL (ref 0.0–0.1)
LYMPHS ABS: 2.3 10*3/uL (ref 0.7–3.1)
Lymphs: 23 %
MCH: 24.5 pg — ABNORMAL LOW (ref 26.6–33.0)
MCHC: 31.2 g/dL — AB (ref 31.5–35.7)
MCV: 79 fL (ref 79–97)
MONOS ABS: 0.7 10*3/uL (ref 0.1–0.9)
Monocytes: 7 %
NEUTROS PCT: 67 %
Neutrophils Absolute: 6.5 10*3/uL (ref 1.4–7.0)
PLATELETS: 261 10*3/uL (ref 150–450)
RBC: 5.5 x10E6/uL — AB (ref 3.77–5.28)
RDW: 15.7 % — AB (ref 12.3–15.4)
WBC: 9.9 10*3/uL (ref 3.4–10.8)

## 2018-02-13 LAB — HEMOGLOBIN A1C
Est. average glucose Bld gHb Est-mCnc: 163 mg/dL
Hgb A1c MFr Bld: 7.3 % — ABNORMAL HIGH (ref 4.8–5.6)

## 2018-02-13 LAB — HEPATIC FUNCTION PANEL
ALT: 20 IU/L (ref 0–32)
AST: 18 IU/L (ref 0–40)
Albumin: 4.3 g/dL (ref 3.6–4.8)
Alkaline Phosphatase: 87 IU/L (ref 39–117)
BILIRUBIN TOTAL: 0.4 mg/dL (ref 0.0–1.2)
Bilirubin, Direct: 0.12 mg/dL (ref 0.00–0.40)
TOTAL PROTEIN: 6.8 g/dL (ref 6.0–8.5)

## 2018-02-13 LAB — LIPID PANEL
CHOL/HDL RATIO: 2.9 ratio (ref 0.0–4.4)
Cholesterol, Total: 188 mg/dL (ref 100–199)
HDL: 64 mg/dL (ref >39)
LDL Calculated: 95 mg/dL (ref 0–99)
TRIGLYCERIDES: 145 mg/dL (ref 0–149)
VLDL CHOLESTEROL CAL: 29 mg/dL (ref 5–40)

## 2018-02-13 LAB — MICROALBUMIN / CREATININE URINE RATIO
Creatinine, Urine: 89.2 mg/dL
Microalb/Creat Ratio: 5.7 mg/g{creat} (ref 0.0–30.0)
Microalbumin, Urine: 5.1 ug/mL

## 2018-02-13 LAB — TSH: TSH: 2.62 u[IU]/mL (ref 0.450–4.500)

## 2018-02-17 DIAGNOSIS — M4312 Spondylolisthesis, cervical region: Secondary | ICD-10-CM | POA: Diagnosis not present

## 2018-02-17 DIAGNOSIS — M9901 Segmental and somatic dysfunction of cervical region: Secondary | ICD-10-CM | POA: Diagnosis not present

## 2018-02-17 DIAGNOSIS — M5412 Radiculopathy, cervical region: Secondary | ICD-10-CM | POA: Diagnosis not present

## 2018-02-17 DIAGNOSIS — M5032 Other cervical disc degeneration, mid-cervical region, unspecified level: Secondary | ICD-10-CM | POA: Diagnosis not present

## 2018-02-28 ENCOUNTER — Encounter: Payer: Self-pay | Admitting: Gastroenterology

## 2018-02-28 ENCOUNTER — Ambulatory Visit (INDEPENDENT_AMBULATORY_CARE_PROVIDER_SITE_OTHER): Payer: BLUE CROSS/BLUE SHIELD | Admitting: Gastroenterology

## 2018-02-28 VITALS — BP 106/70 | HR 76 | Ht 63.25 in | Wt 242.6 lb

## 2018-02-28 DIAGNOSIS — K641 Second degree hemorrhoids: Secondary | ICD-10-CM | POA: Diagnosis not present

## 2018-02-28 NOTE — Patient Instructions (Addendum)
HEMORRHOID BANDING PROCEDURE    FOLLOW-UP CARE   1. The procedure you have had should have been relatively painless since the banding of the area involved does not have nerve endings and there is no pain sensation.  The rubber band cuts off the blood supply to the hemorrhoid and the band may fall off as soon as 48 hours after the banding (the band may occasionally be seen in the toilet bowl following a bowel movement). You may notice a temporary feeling of fullness in the rectum which should respond adequately to plain Tylenol or Motrin.  2. Following the banding, avoid strenuous exercise that evening and resume full activity the next day.  A sitz bath (soaking in a warm tub) or bidet is soothing, and can be useful for cleansing the area after bowel movements.     3. To avoid constipation, take two tablespoons of natural wheat bran, natural oat bran, flax, Benefiber or any over the counter fiber supplement and increase your water intake to 7-8 glasses daily.    4. Unless you have been prescribed anorectal medication, do not put anything inside your rectum for two weeks: No suppositories, enemas, fingers, etc.  5. Occasionally, you may have more bleeding than usual after the banding procedure.  This is often from the untreated hemorrhoids rather than the treated one.  Don't be concerned if there is a tablespoon or so of blood.  If there is more blood than this, lie flat with your bottom higher than your head and apply an ice pack to the area. If the bleeding does not stop within a half an hour or if you feel faint, call our office at [phone] or go to the emergency room.  6. Problems are not common; however, if there is a substantial amount of bleeding, severe pain, chills, fever or difficulty passing urine (very rare) or other problems, you should call us at [phone] or report to the nearest emergency room.  7. Do not stay seated continuously for more than 2-3 hours for a day or two  after the procedure.  Tighten your buttock muscles 10-15 times every two hours and take 10-15 deep breaths every 1-2 hours.  Do not spend more than a few minutes on the toilet if you cannot empty your bowel; instead re-visit the toilet at a later time.   Use Benefiber 1 tablespoon 2-3 times daily Use Miralax capful daily as needed

## 2018-02-28 NOTE — Progress Notes (Signed)
PROCEDURE NOTE: The patient presents with symptomatic grade II  hemorrhoids, requesting rubber band ligation of his/her hemorrhoidal disease.  All risks, benefits and alternative forms of therapy were described and informed consent was obtained.  In the Left Lateral Decubitus position anoscopic examination revealed grade II-IIII hemorrhoids in the Right posterior and right anterior position(s).  The anorectum was pre-medicated with 0.125% The decision was made to band the Right posterior internal hemorrhoid, and the [place] was used to perform band ligation without complication.  Digital anorectal examination was then performed to assure proper positioning of the band, and to adjust the banded tissue as required.  The patient was discharged home without pain or other issues.  Dietary and behavioral recommendations were given and along with follow-up instructions.     The following adjunctive treatments were recommended: Benefiber 1 tablespoon 2-3 times daily with meals MiraLAX 1 capful daily as needed   The patient will return in 2 to 4 weeks for  follow-up and possible additional banding as required. No complications were encountered and the patient tolerated the procedure well.  Damaris Hippo , MD [phone]

## 2018-03-03 ENCOUNTER — Encounter: Payer: Self-pay | Admitting: Internal Medicine

## 2018-03-03 ENCOUNTER — Ambulatory Visit (INDEPENDENT_AMBULATORY_CARE_PROVIDER_SITE_OTHER): Payer: BLUE CROSS/BLUE SHIELD | Admitting: Internal Medicine

## 2018-03-03 VITALS — BP 110/60 | HR 78 | Ht 63.5 in | Wt 239.2 lb

## 2018-03-03 DIAGNOSIS — E1165 Type 2 diabetes mellitus with hyperglycemia: Secondary | ICD-10-CM | POA: Diagnosis not present

## 2018-03-03 DIAGNOSIS — Z794 Long term (current) use of insulin: Secondary | ICD-10-CM | POA: Diagnosis not present

## 2018-03-03 DIAGNOSIS — E785 Hyperlipidemia, unspecified: Secondary | ICD-10-CM | POA: Diagnosis not present

## 2018-03-03 MED ORDER — METFORMIN HCL ER 500 MG PO TB24: 500.0000 mg | ORAL_TABLET | Freq: Two times a day (BID) | ORAL | 5 refills | Status: DC

## 2018-03-03 NOTE — Patient Instructions (Addendum)
Please continue: - Trulicity 1.5 mg weekly - Invokana 300 mg in am - Toujeo 33 units at bedtime  Please add: - Metformin ER 500 mg 2x a day (start 1x a day with dinner)  Please return in 3 months with your sugar log.
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
## 2018-03-05 ENCOUNTER — Other Ambulatory Visit: Payer: Self-pay | Admitting: Internal Medicine

## 2018-03-10 ENCOUNTER — Encounter: Payer: Self-pay | Admitting: Internal Medicine

## 2018-04-01 ENCOUNTER — Encounter: Payer: Self-pay | Admitting: Gastroenterology

## 2018-04-01 ENCOUNTER — Ambulatory Visit (INDEPENDENT_AMBULATORY_CARE_PROVIDER_SITE_OTHER): Payer: BLUE CROSS/BLUE SHIELD | Admitting: Gastroenterology

## 2018-04-01 VITALS — BP 96/60 | HR 84 | Ht 63.25 in | Wt 236.6 lb

## 2018-04-01 DIAGNOSIS — K641 Second degree hemorrhoids: Secondary | ICD-10-CM

## 2018-04-01 NOTE — Patient Instructions (Signed)

## 2018-04-01 NOTE — Progress Notes (Addendum)
PROCEDURE NOTE: The patient presents with symptomatic grade II  hemorrhoids, requesting rubber band ligation of his/her hemorrhoidal disease.  All risks, benefits and alternative forms of therapy were described and informed consent was obtained.   The anorectum was pre-medicated with 0.125% nitroglycerine and Recticare The decision was made to band the Right anterior internal hemorrhoid, and the [place] was used to perform band ligation without complication.  Digital anorectal examination was then performed to assure proper positioning of the band, and to adjust the banded tissue as required.  The patient was discharged home without pain or other issues.  Dietary and behavioral recommendations were given and along with follow-up instructions.     The following adjunctive treatments were recommended: Benefiber 1 teaspoon TID with meals  The patient will return in 2-4 weeks for  follow-up and possible additional banding as required. No complications were encountered and the patient tolerated the procedure well.  Damaris Hippo , MD [phone]   Addendum: Patient informed Dottie (MA) that last time she went to give blood, was informed that she cant do it as she is anemic.  Will check CBC, ferritin and iron panel.

## 2018-04-02 ENCOUNTER — Telehealth: Payer: Self-pay | Admitting: *Deleted

## 2018-04-02 DIAGNOSIS — D508 Other iron deficiency anemias: Secondary | ICD-10-CM

## 2018-04-03 NOTE — Telephone Encounter (Signed)
Patient returned call  Will come to the basement for labs

## 2018-04-03 NOTE — Telephone Encounter (Signed)
Returned patients call , she wanted to let us know she had Labs in June with Dr Regis Bill but explained to her it was a couple of different labs that Dr Silverio Decamp has ordered from what she had done.. She also had a question about a hemorrhoidal band that was on the floor and wanted to make sure it was not her band that was placed.....  Explained to her that it was an extra band that was in the package  that had fell out onto the floor    She has second banding scheduled for Aug 28th

## 2018-04-03 NOTE — Telephone Encounter (Signed)
Advised patient to come in for labs per Robin. Patient states she still has a questions about banding that was done on 7.30.19 and is requesting to speak with Robin.

## 2018-04-04 NOTE — Telephone Encounter (Signed)
ok 

## 2018-04-09 ENCOUNTER — Other Ambulatory Visit: Payer: Self-pay | Admitting: Internal Medicine

## 2018-04-11 ENCOUNTER — Other Ambulatory Visit: Payer: BLUE CROSS/BLUE SHIELD

## 2018-04-11 ENCOUNTER — Other Ambulatory Visit: Payer: Self-pay | Admitting: Gastroenterology

## 2018-04-11 ENCOUNTER — Other Ambulatory Visit: Payer: Self-pay | Admitting: *Deleted

## 2018-04-11 DIAGNOSIS — D508 Other iron deficiency anemias: Secondary | ICD-10-CM

## 2018-04-12 LAB — IRON AND TIBC
Iron Saturation: 10 % — ABNORMAL LOW (ref 15–55)
Iron: 28 ug/dL (ref 27–139)
Total Iron Binding Capacity: 293 ug/dL (ref 250–450)
UIBC: 265 ug/dL (ref 118–369)

## 2018-04-12 LAB — CBC WITH DIFFERENTIAL/PLATELET
BASOS ABS: 0 10*3/uL (ref 0.0–0.2)
Basos: 0 %
EOS (ABSOLUTE): 0.2 10*3/uL (ref 0.0–0.4)
Eos: 3 %
Hematocrit: 36.8 % (ref 34.0–46.6)
Hemoglobin: 11.2 g/dL (ref 11.1–15.9)
Immature Grans (Abs): 0 10*3/uL (ref 0.0–0.1)
Immature Granulocytes: 0 %
LYMPHS ABS: 1.7 10*3/uL (ref 0.7–3.1)
LYMPHS: 26 %
MCH: 24.1 pg — AB (ref 26.6–33.0)
MCHC: 30.4 g/dL — AB (ref 31.5–35.7)
MCV: 79 fL (ref 79–97)
MONOS ABS: 0.6 10*3/uL (ref 0.1–0.9)
Monocytes: 9 %
NEUTROS ABS: 3.9 10*3/uL (ref 1.4–7.0)
Neutrophils: 62 %
PLATELETS: 224 10*3/uL (ref 150–450)
RBC: 4.65 x10E6/uL (ref 3.77–5.28)
RDW: 17.1 % — AB (ref 12.3–15.4)
WBC: 6.3 10*3/uL (ref 3.4–10.8)

## 2018-04-12 LAB — FERRITIN: FERRITIN: 8 ng/mL — AB (ref 15–150)

## 2018-04-21 ENCOUNTER — Other Ambulatory Visit: Payer: Self-pay

## 2018-04-21 DIAGNOSIS — D508 Other iron deficiency anemias: Secondary | ICD-10-CM

## 2018-04-25 ENCOUNTER — Encounter: Payer: Self-pay | Admitting: Internal Medicine

## 2018-04-25 ENCOUNTER — Ambulatory Visit (AMBULATORY_SURGERY_CENTER): Payer: Self-pay | Admitting: *Deleted

## 2018-04-25 VITALS — Ht 63.5 in | Wt 236.0 lb

## 2018-04-25 DIAGNOSIS — D508 Other iron deficiency anemias: Secondary | ICD-10-CM

## 2018-04-25 DIAGNOSIS — E113293 Type 2 diabetes mellitus with mild nonproliferative diabetic retinopathy without macular edema, bilateral: Secondary | ICD-10-CM | POA: Diagnosis not present

## 2018-04-25 LAB — HM DIABETES EYE EXAM

## 2018-04-25 NOTE — Progress Notes (Signed)
Patient denies any allergies to eggs or soy. Patient denies any problems with anesthesia/sedation. Patient denies any oxygen use at home. Patient denies taking any diet/weight loss medications or blood thinners. EMMI education assisgned to patient on EGD, this was explained and instructions given to patient. 

## 2018-04-28 ENCOUNTER — Ambulatory Visit (AMBULATORY_SURGERY_CENTER): Payer: BLUE CROSS/BLUE SHIELD | Admitting: Gastroenterology

## 2018-04-28 ENCOUNTER — Encounter: Payer: Self-pay | Admitting: Gastroenterology

## 2018-04-28 VITALS — BP 124/66 | HR 62 | Temp 97.5°F | Resp 13 | Ht 63.5 in | Wt 236.0 lb

## 2018-04-28 DIAGNOSIS — K3189 Other diseases of stomach and duodenum: Secondary | ICD-10-CM | POA: Diagnosis not present

## 2018-04-28 DIAGNOSIS — D508 Other iron deficiency anemias: Secondary | ICD-10-CM | POA: Diagnosis not present

## 2018-04-28 DIAGNOSIS — D3A8 Other benign neuroendocrine tumors: Secondary | ICD-10-CM | POA: Diagnosis not present

## 2018-04-28 DIAGNOSIS — D649 Anemia, unspecified: Secondary | ICD-10-CM | POA: Diagnosis not present

## 2018-04-28 HISTORY — PX: UPPER GI ENDOSCOPY: SHX6162

## 2018-04-28 MED ORDER — SODIUM CHLORIDE 0.9 % IV SOLN: 500.0000 mL | Freq: Once | INTRAVENOUS | Status: DC

## 2018-04-28 NOTE — Progress Notes (Signed)
Pt's states no medical or surgical changes since previsit or office visit. 

## 2018-04-28 NOTE — Patient Instructions (Signed)
YOU HAD AN ENDOSCOPIC PROCEDURE TODAY AT [facility]:   Refer to the procedure report that was given to you for any specific questions about what was found during the examination.  If the procedure report does not answer your questions, please call your gastroenterologist to clarify.  If you requested that your care partner not be given the details of your procedure findings, then the procedure report has been included in a sealed envelope for you to review at your convenience later.  YOU SHOULD EXPECT: Some feelings of bloating in the abdomen. Passage of more gas than usual.  Walking can help get rid of the air that was put into your GI tract during the procedure and reduce the bloating. If you had a lower endoscopy (such as a colonoscopy or flexible sigmoidoscopy) you may notice spotting of blood in your stool or on the toilet paper. If you underwent a bowel prep for your procedure, you may not have a normal bowel movement for a few days.  Please Note:  You might notice some irritation and congestion in your nose or some drainage.  This is from the oxygen used during your procedure.  There is no need for concern and it should clear up in a day or so.  SYMPTOMS TO REPORT IMMEDIATELY:    Following upper endoscopy (EGD)  Vomiting of blood or coffee ground material  New chest pain or pain under the shoulder blades  Painful or persistently difficult swallowing  New shortness of breath  Fever of 100F or higher  Black, tarry-looking stools  For urgent or emergent issues, a gastroenterologist can be reached at any hour by calling [phone].   DIET:  We do recommend a small meal at first, but then you may proceed to your regular diet.  Drink plenty of fluids but you should avoid alcoholic beverages for 24 hours.  ACTIVITY:  You should plan to take it easy for the rest of today and you should NOT DRIVE or use heavy machinery until tomorrow (because of the sedation medicines used  during the test).    FOLLOW UP: Our staff will call the number listed on your records the next business day following your procedure to check on you and address any questions or concerns that you may have regarding the information given to you following your procedure. If we do not reach you, we will leave a message.  However, if you are feeling well and you are not experiencing any problems, there is no need to return our call.  We will assume that you have returned to your regular daily activities without incident.  If any biopsies were taken you will be contacted by phone or by letter within the next 1-3 weeks.  Please call us at [phone] if you have not heard about the biopsies in 3 weeks.    SIGNATURES/CONFIDENTIALITY: You and/or your care partner have signed paperwork which will be entered into your electronic medical record.  These signatures attest to the fact that that the information above on your After Visit Summary has been reviewed and is understood.  Full responsibility of the confidentiality of this discharge information lies with you and/or your care-partner.    Handout was given to your care partner on a hiatal hernia. Per Dr. Silverio Decamp no high dose aspirin, ibuprofen, naproxen or other NSAIDs medications. You may resume your other current medications today. Your blood sugar was 120 in the recovery room. Await biopsy results. Please call if any  questions or concerns.

## 2018-04-28 NOTE — Progress Notes (Signed)
No problems noted in the recovery room. maw 

## 2018-04-28 NOTE — Op Note (Signed)
[facility] Patient Name: Paige Hernandez Procedure Date: 04/28/2018 11:54 AM MRN: [id number] Endoscopist: Mauri Pole , MD Age: 62 Referring MD:  Date of Birth: May 18, 1956 Gender: Female Account #: [id number] Procedure:                Upper GI endoscopy Indications:              Suspected upper gastrointestinal bleeding in                            patient with unexplained iron deficiency anemia Medicines:                Monitored Anesthesia Care Procedure:                Pre-Anesthesia Assessment:                           - Prior to the procedure, a History and Physical                            was performed, and patient medications and                            allergies were reviewed. The patient's tolerance of                            previous anesthesia was also reviewed. The risks                            and benefits of the procedure and the sedation                            options and risks were discussed with the patient.                            All questions were answered, and informed consent                            was obtained. Prior Anticoagulants: The patient has                            taken no previous anticoagulant or antiplatelet                            agents. ASA Grade Assessment: II - A patient with                            mild systemic disease. After reviewing the risks                            and benefits, the patient was deemed in                            satisfactory condition to undergo the procedure.  After obtaining informed consent, the endoscope was                            passed under direct vision. Throughout the                            procedure, the patient's blood pressure, pulse, and                            oxygen saturations were monitored continuously. The                            Model GIF-[id number]) scope was introduced                            through  the mouth, and advanced to the second part                            of duodenum. The upper GI endoscopy was                            accomplished without difficulty. The patient                            tolerated the procedure well. Scope In: Scope Out: Findings:                 A 5 cm hiatal hernia was present.                           The Z-line was regular and was found 35 cm from the                            incisors.                           The stomach was normal.                           Diffuse mildly scalloped mucosa was found in the                            first portion of the duodenum and in the second                            portion of the duodenum. Biopsies for histology                            were taken with a cold forceps for evaluation of                            celiac disease.                           A single 18 mm sessile polyp with no  bleeding,                            superficial erosion was found in the duodenal bulb.                            Biopsies were taken with a cold forceps for                            histology. Complications:            No immediate complications. Estimated Blood Loss:     Estimated blood loss was minimal. Impression:               - 5 cm hiatal hernia.                           - Z-line regular, 35 cm from the incisors.                           - Normal stomach.                           - Scalloped mucosa was found in the duodenum, rule                            out celiac disease. Biopsied.                           - A single duodenal polyp. Biopsied. Recommendation:           - Resume previous diet.                           - Continue present medications.                           - Await pathology results.                           - No high dose aspirin, ibuprofen, naproxen, or                            other non-steroidal anti-inflammatory drugs.                           - Repeat upper  endoscopy after studies are complete                            for surveillance based on pathology results. Mauri Pole, MD 04/28/2018 12:19:14 PM This report has been signed electronically.

## 2018-04-28 NOTE — Progress Notes (Signed)
A/ox3 pleased with MAC, report to RN 

## 2018-04-28 NOTE — Progress Notes (Signed)
Called to room to assist during endoscopic procedure.  Patient ID and intended procedure confirmed with present staff. Received instructions for my participation in the procedure from the performing physician.  

## 2018-04-29 ENCOUNTER — Telehealth: Payer: Self-pay | Admitting: *Deleted

## 2018-04-29 NOTE — Telephone Encounter (Signed)
  Follow up Call-  Call back number 04/28/2018 12/30/2017  Post procedure Call Back phone  # [phone] cell [phone]  Permission to leave phone message Yes Yes  Some recent data might be hidden     Patient questions:  Do you have a fever, pain , or abdominal swelling? No. Pain Score  0 *  Have you tolerated food without any problems? Yes.    Have you been able to return to your normal activities? Yes.    Do you have any questions about your discharge instructions: Diet   No. Medications  No. Follow up visit  No.  Do you have questions or concerns about your Care? No.  Actions: * If pain score is 4 or above: No action needed, pain <4.

## 2018-04-30 ENCOUNTER — Ambulatory Visit (INDEPENDENT_AMBULATORY_CARE_PROVIDER_SITE_OTHER): Payer: BLUE CROSS/BLUE SHIELD | Admitting: Gastroenterology

## 2018-04-30 ENCOUNTER — Encounter: Payer: Self-pay | Admitting: Gastroenterology

## 2018-04-30 VITALS — BP 114/70 | HR 78 | Ht 63.5 in | Wt 235.2 lb

## 2018-04-30 DIAGNOSIS — K641 Second degree hemorrhoids: Secondary | ICD-10-CM | POA: Diagnosis not present

## 2018-04-30 NOTE — Progress Notes (Signed)
PROCEDURE NOTE: The patient presents with symptomatic grade 2  hemorrhoids, requesting rubber band ligation of his/her hemorrhoidal disease.  All risks, benefits and alternative forms of therapy were described and informed consent was obtained.   The anorectum was pre-medicated with 0.125% Nitroglycerine and Recticare The decision was made to band the Left lateral internal hemorrhoid, and the [place] was used to perform band ligation without complication.  Digital anorectal examination was then performed to assure proper positioning of the band, and to adjust the banded tissue as required.  The patient was discharged home without pain or other issues.  Dietary and behavioral recommendations were given and along with follow-up instructions.      The patient will return  for  follow-up and possible additional banding as required. No complications were encountered and the patient tolerated the procedure well.  Damaris Hippo , MD [phone]

## 2018-04-30 NOTE — Patient Instructions (Addendum)
HEMORRHOID BANDING PROCEDURE    FOLLOW-UP CARE   1. The procedure you have had should have been relatively painless since the banding of the area involved does not have nerve endings and there is no pain sensation.  The rubber band cuts off the blood supply to the hemorrhoid and the band may fall off as soon as 48 hours after the banding (the band may occasionally be seen in the toilet bowl following a bowel movement). You may notice a temporary feeling of fullness in the rectum which should respond adequately to plain Tylenol or Motrin.  2. Following the banding, avoid strenuous exercise that evening and resume full activity the next day.  A sitz bath (soaking in a warm tub) or bidet is soothing, and can be useful for cleansing the area after bowel movements.     3. To avoid constipation, take two tablespoons of natural wheat bran, natural oat bran, flax, Benefiber or any over the counter fiber supplement and increase your water intake to 7-8 glasses daily.    4. Unless you have been prescribed anorectal medication, do not put anything inside your rectum for two weeks: No suppositories, enemas, fingers, etc.  5. Occasionally, you may have more bleeding than usual after the banding procedure.  This is often from the untreated hemorrhoids rather than the treated one.  Don't be concerned if there is a tablespoon or so of blood.  If there is more blood than this, lie flat with your bottom higher than your head and apply an ice pack to the area. If the bleeding does not stop within a half an hour or if you feel faint, call our office at [phone] or go to the emergency room.  6. Problems are not common; however, if there is a substantial amount of bleeding, severe pain, chills, fever or difficulty passing urine (very rare) or other problems, you should call us at [phone] or report to the nearest emergency room.  7. Do not stay seated continuously for more than 2-3 hours for a day or two  after the procedure.  Tighten your buttock muscles 10-15 times every two hours and take 10-15 deep breaths every 1-2 hours.  Do not spend more than a few minutes on the toilet if you cannot empty your bowel; instead re-visit the toilet at a later time.    Continue to take SlowFe three times a day

## 2018-05-14 ENCOUNTER — Other Ambulatory Visit: Payer: BLUE CROSS/BLUE SHIELD

## 2018-05-14 ENCOUNTER — Other Ambulatory Visit: Payer: Self-pay

## 2018-05-14 ENCOUNTER — Other Ambulatory Visit: Payer: Self-pay | Admitting: *Deleted

## 2018-05-14 DIAGNOSIS — K760 Fatty (change of) liver, not elsewhere classified: Secondary | ICD-10-CM

## 2018-05-14 DIAGNOSIS — D3A092 Benign carcinoid tumor of the stomach: Secondary | ICD-10-CM

## 2018-05-14 DIAGNOSIS — D3A00 Benign carcinoid tumor of unspecified site: Secondary | ICD-10-CM

## 2018-05-15 LAB — CREATININE, SERUM
Creatinine, Ser: 0.87 mg/dL (ref 0.57–1.00)
GFR calc Af Amer: 82 mL/min/{1.73_m2} (ref >59)
GFR, EST NON AFRICAN AMERICAN: 71 mL/min/{1.73_m2} (ref >59)

## 2018-05-15 LAB — BUN: BUN: 15 mg/dL (ref 8–27)

## 2018-05-20 ENCOUNTER — Ambulatory Visit (INDEPENDENT_AMBULATORY_CARE_PROVIDER_SITE_OTHER)
Admission: RE | Admit: 2018-05-20 | Discharge: 2018-05-20 | Disposition: A | Discharge status: Home / Self Care | Payer: BLUE CROSS/BLUE SHIELD | Source: Ambulatory Visit | Attending: Gastroenterology | Admitting: Gastroenterology

## 2018-05-20 DIAGNOSIS — Z1231 Encounter for screening mammogram for malignant neoplasm of breast: Secondary | ICD-10-CM | POA: Diagnosis not present

## 2018-05-20 DIAGNOSIS — D649 Anemia, unspecified: Secondary | ICD-10-CM | POA: Diagnosis not present

## 2018-05-20 DIAGNOSIS — D3A092 Benign carcinoid tumor of the stomach: Secondary | ICD-10-CM | POA: Diagnosis not present

## 2018-05-20 DIAGNOSIS — C7A010 Malignant carcinoid tumor of the duodenum: Secondary | ICD-10-CM | POA: Diagnosis not present

## 2018-05-20 MED ORDER — IOPAMIDOL (ISOVUE-300) INJECTION 61%
100.0000 mL | Freq: Once | INTRAVENOUS | Status: AC | PRN
  Administered 2018-05-20: 100 mL via INTRAVENOUS

## 2018-05-23 ENCOUNTER — Ambulatory Visit: Payer: BLUE CROSS/BLUE SHIELD | Admitting: Gastroenterology

## 2018-05-23 ENCOUNTER — Encounter: Payer: Self-pay | Admitting: Gastroenterology

## 2018-05-23 VITALS — BP 112/74 | HR 72 | Ht 63.25 in | Wt 234.4 lb

## 2018-05-23 DIAGNOSIS — D3A010 Benign carcinoid tumor of the duodenum: Secondary | ICD-10-CM | POA: Diagnosis not present

## 2018-05-23 DIAGNOSIS — R198 Other specified symptoms and signs involving the digestive system and abdomen: Secondary | ICD-10-CM | POA: Insufficient documentation

## 2018-05-23 LAB — CHROMOGRANIN A: Chromogranin A: 48 nmol/L — ABNORMAL HIGH (ref 0–5)

## 2018-05-23 NOTE — Patient Instructions (Signed)
Normal BMI (Body Mass Index- based on height and weight) is between 19 and 25. Your BMI today is Body mass index is 41.19 kg/m. Marland Kitchen Please consider follow up  regarding your BMI with your Primary Care Provider.  You have been scheduled for an EUS/EMR at [facility]. Please follow written instructions given to you at your visit today. If you use inhalers (even only as needed), please bring them with you on the day of your procedure. Your physician has requested that you go to www.startemmi.com and enter the access code given to you at your visit today. This web site gives a general overview about your procedure. However, you should still follow specific instructions given to you by our office regarding your preparation for the procedure.
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
## 2018-06-11 ENCOUNTER — Telehealth: Payer: Self-pay

## 2018-06-11 NOTE — Telephone Encounter (Signed)
-----   Message from Greggory Keen, LPN sent at 0/09/7791 11:34 AM EDT ----- Check for the status of scheduling this. "She will benefit from an EUS to understand the depth of invasion (hopefully just Layer 2/3 vs Layer 4).  As long as it is not extended into Layer 4, I think an EMR attempt is reasonable.  For these lesions, I would like to tackle when we have the [facility] available because it allows a nice mucosectomy and hopefully decrease risk of perforation (the bulb ones are higher risk).  If she gets an EMR and we have clean margins, these were patients that were being followed with EUS/EGD for a few years.  However, if truly this lesion is greater than 15 mm in size, it does carry a potential higher risk of distal LN metastasis in her overall lifetime.  Normally a chromogranin and CT were obtained prior to attempt at EMR.  I would probably start with this, and then we can get set up for an EUS, probably in October (because I would like to do this as an EUS/EMR combined procedure) and hopefully we will have my equipment available.  I would be happy to see and talk with her briefly about the procedure and potential follow up as well.  I think, seeing surgery vs Oncology, I will defer to you all as to how you have been doing that referral for the last few years.  Let me know what you think.  Thanks. "

## 2018-06-16 ENCOUNTER — Telehealth: Payer: Self-pay

## 2018-06-16 ENCOUNTER — Encounter: Payer: Self-pay | Admitting: Internal Medicine

## 2018-06-16 NOTE — Telephone Encounter (Signed)
Vacation to [employer] . Surgery is 06/25/18. She says if she stops the Nexium, within 2 to 3 days she has reflux "so bad I can't stand it." She wants to wait until after her surgery to stop the Nexium for 2 weeks to repeat the Chromogranin. Please advise.

## 2018-06-16 NOTE — Telephone Encounter (Signed)
Ok we can check after the procedure, Continue Nexium.

## 2018-06-16 NOTE — Telephone Encounter (Signed)
-----   Message from Mauri Pole, MD sent at 06/12/2018 12:17 PM EDT ----- Eustaquio Maize, please see note below Please advise patient to hold PPI for 2 weeks and repeat chromogranin. Thanks ----- Message ----- From: Irving Copas., MD Sent: 06/05/2018   3:06 AM EDT To: Mauri Pole, MD  Kavitha, Thanks for letting me know. This is interesting. Can we try and get a repeat Chromogranin sent with the patient off of her PPI for about 1.5-2 weeks? This can elevate Chromogranin. If the repeat is still elevated, we may want to consider a DOTATATE Scan. Plan on having patient stop PPI and recheck. We have a bit of time since my procedure is not until the 24th, so should get results back. Thanks, and keep me UTD.  Gabe ----- Message ----- From: Mauri Pole, MD Sent: 06/04/2018   2:43 PM EDT To: Irving Copas., MD  Chromogranin is elevated.

## 2018-06-16 NOTE — Telephone Encounter (Signed)
Patient is advised. She thanks you for understanding.

## 2018-06-17 ENCOUNTER — Ambulatory Visit: Payer: BLUE CROSS/BLUE SHIELD | Admitting: Family Medicine

## 2018-06-17 ENCOUNTER — Encounter: Payer: Self-pay | Admitting: Family Medicine

## 2018-06-17 VITALS — BP 120/82 | HR 73 | Temp 97.8°F | Resp 16 | Ht 63.25 in | Wt 232.5 lb

## 2018-06-17 DIAGNOSIS — F40243 Fear of flying: Secondary | ICD-10-CM | POA: Diagnosis not present

## 2018-06-17 DIAGNOSIS — F4323 Adjustment disorder with mixed anxiety and depressed mood: Secondary | ICD-10-CM

## 2018-06-17 MED ORDER — ALPRAZOLAM 0.25 MG PO TABS: ORAL_TABLET | ORAL | 0 refills | Status: DC
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
## 2018-06-17 NOTE — Patient Instructions (Signed)
  Ms.Atia O Jeffus I have seen you today for an acute visit.  A few things to remember from today's visit:   Flying phobia - Plan: ALPRAZolam (XANAX) 0.25 MG tablet  ADJ DISORDER WITH MIXED ANXIETY & DEPRESSED MOOD - Plan: ALPRAZolam (XANAX) 0.25 MG tablet   If medications prescribed today, they will not be refill upon request, a follow up appointment with PCP will be necessary to discuss continuation of of treatment if appropriate.   Xanax was sent to your pharmacy, recommend taking 1 when flying, you can take an extra 1/2 or 1 extra tablet if needed. This is a controlled medication, in the future it needs to be prescribed by your PCP, if appropriate.  In general please monitor for signs of worsening symptoms and seek immediate medical attention if any concerning.

## 2018-06-18 ENCOUNTER — Encounter: Payer: Self-pay | Admitting: Internal Medicine

## 2018-06-18 ENCOUNTER — Ambulatory Visit: Payer: BLUE CROSS/BLUE SHIELD | Admitting: Internal Medicine

## 2018-06-18 VITALS — BP 120/70 | HR 68 | Ht 63.25 in | Wt 230.0 lb

## 2018-06-18 DIAGNOSIS — E1165 Type 2 diabetes mellitus with hyperglycemia: Secondary | ICD-10-CM | POA: Diagnosis not present

## 2018-06-18 DIAGNOSIS — Z794 Long term (current) use of insulin: Secondary | ICD-10-CM

## 2018-06-18 DIAGNOSIS — E785 Hyperlipidemia, unspecified: Secondary | ICD-10-CM

## 2018-06-18 LAB — POCT GLYCOSYLATED HEMOGLOBIN (HGB A1C): HEMOGLOBIN A1C: 6.4 % — AB (ref 4.0–5.6)
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
## 2018-06-18 NOTE — Patient Instructions (Addendum)
Please continue: - Trulicity 1.5 mg weekly - Invokana 300 mg in am - Toujeo 33 units at bedtime (can increase by 4 units after you return if sugars are high)  HAVE FUN AT [facility]!  Please return in 4 months with your sugar log.

## 2018-06-18 NOTE — Telephone Encounter (Signed)
Please advise 

## 2018-06-24 ENCOUNTER — Encounter (HOSPITAL_COMMUNITY): Payer: Self-pay | Admitting: *Deleted

## 2018-06-24 ENCOUNTER — Ambulatory Visit: Payer: BLUE CROSS/BLUE SHIELD | Admitting: Gastroenterology

## 2018-06-25 ENCOUNTER — Ambulatory Visit (HOSPITAL_COMMUNITY): Payer: BLUE CROSS/BLUE SHIELD | Admitting: Anesthesiology

## 2018-06-25 ENCOUNTER — Other Ambulatory Visit: Payer: Self-pay

## 2018-06-25 ENCOUNTER — Telehealth: Payer: Self-pay

## 2018-06-25 ENCOUNTER — Ambulatory Visit (HOSPITAL_COMMUNITY)
Admission: RE | Admit: 2018-06-25 | Discharge: 2018-06-25 | Disposition: A | Discharge status: Home / Self Care | Payer: BLUE CROSS/BLUE SHIELD | Source: Ambulatory Visit | Attending: Gastroenterology | Admitting: Gastroenterology

## 2018-06-25 ENCOUNTER — Encounter (HOSPITAL_COMMUNITY): Payer: Self-pay | Admitting: *Deleted

## 2018-06-25 ENCOUNTER — Encounter (HOSPITAL_COMMUNITY)
Admission: RE | Disposition: A | Discharge status: Home / Self Care | Payer: Self-pay | Source: Ambulatory Visit | Attending: Gastroenterology

## 2018-06-25 DIAGNOSIS — D3A010 Benign carcinoid tumor of the duodenum: Secondary | ICD-10-CM

## 2018-06-25 DIAGNOSIS — K210 Gastro-esophageal reflux disease with esophagitis: Secondary | ICD-10-CM | POA: Diagnosis not present

## 2018-06-25 DIAGNOSIS — R198 Other specified symptoms and signs involving the digestive system and abdomen: Secondary | ICD-10-CM

## 2018-06-25 DIAGNOSIS — K3189 Other diseases of stomach and duodenum: Secondary | ICD-10-CM | POA: Diagnosis not present

## 2018-06-25 DIAGNOSIS — I899 Noninfective disorder of lymphatic vessels and lymph nodes, unspecified: Secondary | ICD-10-CM

## 2018-06-25 DIAGNOSIS — Z8719 Personal history of other diseases of the digestive system: Secondary | ICD-10-CM

## 2018-06-25 DIAGNOSIS — K449 Diaphragmatic hernia without obstruction or gangrene: Secondary | ICD-10-CM

## 2018-06-25 DIAGNOSIS — E119 Type 2 diabetes mellitus without complications: Secondary | ICD-10-CM | POA: Diagnosis not present

## 2018-06-25 DIAGNOSIS — K209 Esophagitis, unspecified: Secondary | ICD-10-CM | POA: Diagnosis not present

## 2018-06-25 DIAGNOSIS — R1909 Other intra-abdominal and pelvic swelling, mass and lump: Secondary | ICD-10-CM | POA: Diagnosis not present

## 2018-06-25 DIAGNOSIS — D3A00 Benign carcinoid tumor of unspecified site: Secondary | ICD-10-CM

## 2018-06-25 HISTORY — PX: EUS: SHX5427

## 2018-06-25 HISTORY — PX: ESOPHAGOGASTRODUODENOSCOPY (EGD) WITH PROPOFOL: SHX5813

## 2018-06-25 LAB — GLUCOSE, CAPILLARY: GLUCOSE-CAPILLARY: 134 mg/dL — AB (ref 70–99)

## 2018-06-25 SURGERY — UPPER ENDOSCOPIC ULTRASOUND (EUS) RADIAL
Anesthesia: Monitor Anesthesia Care

## 2018-06-25 MED ORDER — KETAMINE HCL 10 MG/ML IJ SOLN
INTRAMUSCULAR | Status: DC | PRN
  Administered 2018-06-25: 20 mg via INTRAVENOUS

## 2018-06-25 MED ORDER — FENTANYL CITRATE (PF) 100 MCG/2ML IJ SOLN
INTRAMUSCULAR | Status: DC | PRN
  Administered 2018-06-25 (×2): 50 ug via INTRAVENOUS

## 2018-06-25 MED ORDER — SODIUM CHLORIDE 0.9 % IV SOLN: INTRAVENOUS | Status: DC

## 2018-06-25 MED ORDER — MIDAZOLAM HCL 2 MG/2ML IJ SOLN
INTRAMUSCULAR | Status: AC
  Filled 2018-06-25: qty 2

## 2018-06-25 MED ORDER — MIDAZOLAM HCL 5 MG/5ML IJ SOLN
INTRAMUSCULAR | Status: DC | PRN
  Administered 2018-06-25 (×2): 1 mg via INTRAVENOUS

## 2018-06-25 MED ORDER — LACTATED RINGERS IV SOLN
INTRAVENOUS | Status: DC | PRN
  Administered 2018-06-25: 07:00:00 via INTRAVENOUS

## 2018-06-25 MED ORDER — SUCCINYLCHOLINE CHLORIDE 20 MG/ML IJ SOLN
INTRAMUSCULAR | Status: DC | PRN
  Administered 2018-06-25: 5 mg via INTRAVENOUS

## 2018-06-25 MED ORDER — SPOT INK MARKER SYRINGE KIT
PACK | SUBMUCOSAL | Status: AC
  Filled 2018-06-25: qty 5

## 2018-06-25 MED ORDER — KETAMINE HCL 10 MG/ML IJ SOLN
INTRAMUSCULAR | Status: AC
  Filled 2018-06-25: qty 1

## 2018-06-25 MED ORDER — PROPOFOL 10 MG/ML IV BOLUS
INTRAVENOUS | Status: AC
  Filled 2018-06-25: qty 40

## 2018-06-25 MED ORDER — EPINEPHRINE PF 1 MG/10ML IJ SOSY
PREFILLED_SYRINGE | INTRAMUSCULAR | Status: AC
  Filled 2018-06-25: qty 10

## 2018-06-25 MED ORDER — FENTANYL CITRATE (PF) 100 MCG/2ML IJ SOLN
INTRAMUSCULAR | Status: AC
  Filled 2018-06-25: qty 2

## 2018-06-25 MED ORDER — PROPOFOL 500 MG/50ML IV EMUL
INTRAVENOUS | Status: DC | PRN
  Administered 2018-06-25: 170 ug/kg/min via INTRAVENOUS

## 2018-06-25 MED ORDER — LIDOCAINE HCL 1 % IJ SOLN
INTRAMUSCULAR | Status: DC | PRN
  Administered 2018-06-25: 50 mg via INTRADERMAL

## 2018-06-25 MED ORDER — LACTATED RINGERS IV SOLN
INTRAVENOUS | Status: AC | PRN
  Administered 2018-06-25: 1000 mL via INTRAVENOUS

## 2018-06-25 NOTE — Anesthesia Preprocedure Evaluation (Addendum)
Anesthesia Evaluation  Patient identified by MRN, date of birth, ID band Patient awake    Reviewed: Allergy & Precautions, NPO status , Patient's Chart, lab work & pertinent test results  Airway Mallampati: II  TM Distance: >3 FB Neck ROM: Full    Dental no notable dental hx.    Pulmonary neg pulmonary ROS,    Pulmonary exam normal breath sounds clear to auscultation       Cardiovascular negative cardio ROS Normal cardiovascular exam Rhythm:Regular Rate:Normal     Neuro/Psych negative neurological ROS  negative psych ROS   GI/Hepatic negative GI ROS, Neg liver ROS,   Endo/Other  diabetes, Type 2  Renal/GU negative Renal ROS  negative genitourinary   Musculoskeletal negative musculoskeletal ROS (+)   Abdominal   Peds negative pediatric ROS (+)  Hematology negative hematology ROS (+)   Anesthesia Other Findings   Reproductive/Obstetrics negative OB ROS                             Anesthesia Physical Anesthesia Plan  ASA: II  Anesthesia Plan: General   Post-op Pain Management:    Induction: Intravenous  PONV Risk Score and Plan: 2 and Treatment may vary due to age or medical condition and Ondansetron  Airway Management Planned: Oral ETT  Additional Equipment:   Intra-op Plan:   Post-operative Plan:   Informed Consent: I have reviewed the patients History and Physical, chart, labs and discussed the procedure including the risks, benefits and alternatives for the proposed anesthesia with the patient or authorized representative who has indicated his/her understanding and acceptance.   Dental advisory given  Plan Discussed with:   Anesthesia Plan Comments: (GA for length of procedure)       Anesthesia Quick Evaluation

## 2018-06-25 NOTE — Op Note (Signed)
[facility] Patient Name: Paige Hernandez Procedure Date: 06/25/2018 MRN: [id number] Attending MD: Justice Britain , MD Date of Birth: [birth year] CSN: [id number] Age: 62 Admit Type: Outpatient Procedure:                Upper EUS Indications:              Duodenal mucosal mass/polyp found on endoscopy,                            Personal history of digestive disease (unspecified) Providers:                Justice Britain, MD, Carlyn Reichert, RN, Charolette Child, Technician, Karis Juba, CRNA Referring MD:             Mauri Pole, MD Medicines:                Monitored Anesthesia Care Complications:            No immediate complications. Estimated Blood Loss:     Estimated blood loss: none. Procedure:                Pre-Anesthesia Assessment:                           - Prior to the procedure, a History and Physical                            was performed, and patient medications and                            allergies were reviewed. The patient's tolerance of                            previous anesthesia was also reviewed. The risks                            and benefits of the procedure and the sedation                            options and risks were discussed with the patient.                            All questions were answered, and informed consent                            was obtained. Prior Anticoagulants: The patient has                            taken ibuprofen. ASA Grade Assessment: II - A                            patient with mild systemic disease. After reviewing  the risks and benefits, the patient was deemed in                            satisfactory condition to undergo the procedure.                           After obtaining informed consent, the endoscope was                            passed under direct vision. Throughout the                            procedure, the  patient's blood pressure, pulse, and                            oxygen saturations were monitored continuously. The                            [id number] ([id number]) Olympus EGD Therapeutic was                            introduced through the mouth, and advanced to the                            second part of duodenum. The [id number] ([id number])                            Olympus Radial EUS was introduced through the                            mouth, and advanced to the duodenum for ultrasound                            examination from the stomach and duodenum. The                            upper EUS was accomplished without difficulty. The                            patient tolerated the procedure well. Scope In: Scope Out: Findings:      ENDOSCOPIC FINDING: :      No gross lesions were noted in the proximal esophagus and in the mid       esophagus.      LA Grade A (one or more mucosal breaks less than 5 mm, not extending       between tops of 2 mucosal folds) esophagitis with no bleeding was found       at the gastroesophageal junction.      A large amount of food (residue) was found in the entire examined       stomach.      A single 12 mm submucosal nodule was found in the duodenal bulb.      Food (residue) was found in the duodenal bulb, in the first portion of       the duodenum and  in the second portion of the duodenum.      ENDOSONOGRAPHIC FINDING: :      A round intramural (subepithelial) lesion was found in the duodenal       bulb. The lesion was hypoechoic. Endosonographically, the lesion       appeared to originate from within the luminal interface/superficial       mucosa (Layer 1) and deep mucosa (Layer 2). The lesion measured 10 mm       (in maximum thickness). The lesion also measured 6 mm in diameter. The       outer margins were well defined. There was no invasion in the muscularis       propria.      No malignant-appearing lymph nodes were visualized in the porta  hepatis       region and in the duodenal region. However, foodstuffs precluded a full       examination of this region as well as in the stomach.      Endosonographic imaging in the visualized portion of the liver showed no       lesion. Impression:               EGD Impression:                           - No gross lesions in proximal/middle esophagus.                           - LA Grade A esophagitis at the [employer].                           - A large amount of food (residue) in the stomach.                           - Retained food in the duodenum.                           - Submucosal nodule found in the duodenum.                           EUS Impression:                           - An intramural (subepithelial) lesion was found in                            the duodenal bulb. The lesion appeared to originate                            from within the luminal interface/superficial                            mucosa (Layer 1) and deep mucosa (Layer 2). Tissue                            has not been obtained. 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 There is no invasion into the                            muscularis.                           - No malignant-appearing lymph nodes were                            visualized in the porta hepatis region. However,                            because of the significant foodstuffs,                            visualization of lymph nodes is impaired and thus                            cannot feel confident (even though negative CT                            recently) that we can pursue this.                           - With foodstuffs in region of the stomach and                            duodenum, moving forward with EMR, should patient                            have a defect, would not be ideal either. Thus                             decision made to hold on pursuing any procedure. Moderate Sedation:      N/A- Per Anesthesia Care Recommendation:           - The patient will be observed post-procedure,                            until all discharge criteria are met.                           - Discharge patient to home.                           - Patient has a contact number available for                            emergencies. The signs and symptoms of potential                            delayed  complications were discussed with the                            patient. Return to normal activities tomorrow.                            Written discharge instructions were provided to the                            patient.                           - Observe patient's clinical course.                           - Will plan to reschedule patient with 24 hours of                            clear liquids prior to procedure and give a dose of                            Reglan the day before as well.                           - Based on what we find, as long as there are no                            lymph nodes in the region that are concerning, the                            lesion should be able to be removed via EMR based                            on the location and the size with hope for negative                            margins.                           - Plan for PPI to continue for now.                           - The findings and recommendations were discussed                            with the patient.                           - The findings and recommendations were discussed                            with the patient's family.                           -  The findings and recommendations were discussed                            with the referring physician. Procedure Code(s):        --- Professional ---                           [phone], Esophagogastroduodenoscopy, flexible,                             transoral; with endoscopic ultrasound examination                            limited to the esophagus, stomach or duodenum, and                            adjacent structures Diagnosis Code(s):        --- Professional ---                           K20.9, Esophagitis, unspecified                           K31.89, Other diseases of stomach and duodenum                           I89.9, Noninfective disorder of lymphatic vessels                            and lymph nodes, unspecified                           Z87.19, Personal history of other diseases of the                            digestive system CPT copyright 2018 American Medical Association. All rights reserved. The codes documented in this report are preliminary and upon coder review may  be revised to meet current compliance requirements. Justice Britain, MD 06/25/2018 8:43:14 AM Number of Addenda: 0

## 2018-06-25 NOTE — Telephone Encounter (Signed)
Mansouraty, Telford Nab., MD  Timothy Lasso, RN; Mauri Pole, MD        [facility], patient had significant foodstuffs precluding a good LN evaluation, but I didn't see much to be concerned of and the lesion is only Layer 1/2 so not involving muscularis and looks to be amenable to EMR.  Paige Hernandez, let's plan on patient being rescheduled for later this month or December. Clear liquid diet as if she was getting a colonoscopy (which she has tolerated this diet previously). Also let's write her for a single dose of Oral Reglan 10 mg to be taken the evening before procedure.  Thanks.  Chester Holstein

## 2018-06-25 NOTE — Anesthesia Procedure Notes (Signed)
Procedure Name: Intubation Date/Time: 06/25/2018 7:43 AM Performed by: Garrel Ridgel, CRNA Pre-anesthesia Checklist: Patient identified, Emergency Drugs available, Suction available, Patient being monitored and Timeout performed Patient Re-evaluated:Patient Re-evaluated prior to induction Oxygen Delivery Method: Circle system utilized Preoxygenation: Pre-oxygenation with 100% oxygen Induction Type: IV induction Ventilation: Mask ventilation without difficulty Laryngoscope Size: Mac and 4 Grade View: Grade I Tube type: Oral Number of attempts: 1 Airway Equipment and Method: Stylet Placement Confirmation: ETT inserted through vocal cords under direct vision,  positive ETCO2 and breath sounds checked- equal and bilateral Secured at: 19 cm Tube secured with: Tape Dental Injury: Teeth and Oropharynx as per pre-operative assessment

## 2018-06-25 NOTE — Transfer of Care (Signed)
Immediate Anesthesia Transfer of Care Note  Patient: Paige Hernandez  Procedure(s) Performed: UPPER ENDOSCOPIC ULTRASOUND (EUS) RADIAL (N/A ) ESOPHAGOGASTRODUODENOSCOPY (EGD) WITH PROPOFOL (N/A )  Patient Location: PACU and Endoscopy Unit  Anesthesia Type:General  Level of Consciousness: awake, alert  and oriented  Airway & Oxygen Therapy: Patient Spontanous Breathing and Patient connected to face mask oxygen  Post-op Assessment: Report given to RN and Post -op Vital signs reviewed and stable  Post vital signs: Reviewed and stable  Last Vitals:  Vitals Value Taken Time  BP    Temp    Pulse 78 06/25/2018  8:29 AM  Resp 8 06/25/2018  8:29 AM  SpO2 100 % 06/25/2018  8:29 AM  Vitals shown include unvalidated device data.  Last Pain:  Vitals:   06/25/18 0708  TempSrc: Oral  PainSc: 0-No pain         Complications: No apparent anesthesia complications

## 2018-06-25 NOTE — Anesthesia Postprocedure Evaluation (Signed)
Anesthesia Post Note  Patient: Paige Hernandez  Procedure(s) Performed: UPPER ENDOSCOPIC ULTRASOUND (EUS) RADIAL (N/A ) ESOPHAGOGASTRODUODENOSCOPY (EGD) WITH PROPOFOL (N/A )     Patient location during evaluation: Endoscopy Anesthesia Type: MAC Level of consciousness: awake and alert Pain management: pain level controlled Vital Signs Assessment: post-procedure vital signs reviewed and stable Respiratory status: spontaneous breathing, nonlabored ventilation, respiratory function stable and patient connected to nasal cannula oxygen Cardiovascular status: stable and blood pressure returned to baseline Postop Assessment: no apparent nausea or vomiting Anesthetic complications: no    Last Vitals:  Vitals:   06/25/18 0708 06/25/18 0830  BP: 120/81 137/75  Pulse:  78  Resp: 17 (!) 8  Temp: 36.7 C 36.5 C  SpO2: (!) 81% 100%    Last Pain:  Vitals:   06/25/18 0850  TempSrc:   PainSc: 0-No pain                 Montez Hageman

## 2018-06-25 NOTE — Telephone Encounter (Signed)
Appt made for 07/21/18 [facility] at 1030 am.  Left message on machine to call back

## 2018-06-25 NOTE — Discharge Instructions (Signed)
Esophagogastroduodenoscopy, Care After °Refer to this sheet in the next few weeks. These instructions provide you with information about caring for yourself after your procedure. Your health care provider may also give you more specific instructions. Your treatment has been planned according to current medical practices, but problems sometimes occur. Call your health care provider if you have any problems or questions after your procedure. °What can I expect after the procedure? °After the procedure, it is common to have: °· A sore throat. °· Nausea. °· Bloating. °· Dizziness. °· Fatigue. ° °Follow these instructions at home: °· Do not eat or drink anything until the numbing medicine (local anesthetic) has worn off and your gag reflex has returned. You will know that the local anesthetic has worn off when you can swallow comfortably. °· Do not drive for 24 hours if you received a medicine to help you relax (sedative). °· If your health care provider took a tissue sample for testing during the procedure, make sure to get your test results. This is your responsibility. Ask your health care provider or the department performing the test when your results will be ready. °· Keep all follow-up visits as told by your health care provider. This is important. °Contact a health care provider if: °· You cannot stop coughing. °· You are not urinating. °· You are urinating less than usual. °Get help right away if: °· You have trouble swallowing. °· You cannot eat or drink. °· You have throat or chest pain that gets worse. °· You are dizzy or light-headed. °· You faint. °· You have nausea or vomiting. °· You have chills. °· You have a fever. °· You have severe abdominal pain. °· You have black, tarry, or bloody stools. °This information is not intended to replace advice given to you by your health care provider. Make sure you discuss any questions you have with your health care provider. °Document Released: 08/06/2012 Document  Revised: 01/26/2016 Document Reviewed: 07/14/2015 °Elsevier Interactive Patient Education © 2018 Elsevier Inc. ° °
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
## 2018-06-26 ENCOUNTER — Encounter (HOSPITAL_COMMUNITY): Payer: Self-pay | Admitting: Gastroenterology

## 2018-06-26 MED ORDER — METOCLOPRAMIDE HCL 10 MG PO TABS: 10.0000 mg | ORAL_TABLET | Freq: Once | ORAL | 0 refills | Status: DC

## 2018-06-26 NOTE — Telephone Encounter (Signed)
EGD scheduled, pt instructed and medications reviewed.  Patient instructions mailed to home.  Patient to call with any questions or concerns.  

## 2018-07-04 DIAGNOSIS — D3A8 Other benign neuroendocrine tumors: Secondary | ICD-10-CM

## 2018-07-04 HISTORY — DX: Other benign neuroendocrine tumors: D3A.8

## 2018-07-14 ENCOUNTER — Other Ambulatory Visit: Payer: Self-pay | Admitting: Internal Medicine

## 2018-07-17 ENCOUNTER — Other Ambulatory Visit: Payer: Self-pay

## 2018-07-17 ENCOUNTER — Encounter (HOSPITAL_COMMUNITY): Payer: Self-pay | Admitting: *Deleted

## 2018-07-17 NOTE — Progress Notes (Signed)
Paige Hernandez denies chest pain or shortness of breath. Paige Hernandez states that she is not taking any medications the morning of procedure. Paige Thurow has Type II diabetes , she checks CBG in the evenings and it runs around 120- 180.  Paige Hernandez  Instructed to take 1/2 of Toujeo the evening of surgery, Paige Hernandez states that she probably will not take any the evening before.

## 2018-07-18 NOTE — OR Nursing (Signed)
Spoke with Ms Genson about change in procedure time Monday. Moved up to 830a (per dr Rush Landmark) with [phone] arrival.

## 2018-07-21 ENCOUNTER — Other Ambulatory Visit: Payer: Self-pay

## 2018-07-21 ENCOUNTER — Encounter (HOSPITAL_COMMUNITY): Payer: Self-pay | Admitting: *Deleted

## 2018-07-21 ENCOUNTER — Ambulatory Visit (HOSPITAL_COMMUNITY): Payer: BLUE CROSS/BLUE SHIELD | Admitting: Anesthesiology

## 2018-07-21 ENCOUNTER — Encounter (HOSPITAL_COMMUNITY)
Admission: RE | Disposition: A | Discharge status: Home / Self Care | Payer: Self-pay | Source: Ambulatory Visit | Attending: Gastroenterology

## 2018-07-21 ENCOUNTER — Ambulatory Visit (HOSPITAL_COMMUNITY)
Admission: RE | Admit: 2018-07-21 | Discharge: 2018-07-21 | Disposition: A | Discharge status: Home / Self Care | Payer: BLUE CROSS/BLUE SHIELD | Source: Ambulatory Visit | Attending: Gastroenterology | Admitting: Gastroenterology

## 2018-07-21 DIAGNOSIS — K922 Gastrointestinal hemorrhage, unspecified: Secondary | ICD-10-CM | POA: Diagnosis not present

## 2018-07-21 DIAGNOSIS — Z794 Long term (current) use of insulin: Secondary | ICD-10-CM | POA: Insufficient documentation

## 2018-07-21 DIAGNOSIS — I899 Noninfective disorder of lymphatic vessels and lymph nodes, unspecified: Secondary | ICD-10-CM | POA: Diagnosis not present

## 2018-07-21 DIAGNOSIS — D3A010 Benign carcinoid tumor of the duodenum: Secondary | ICD-10-CM

## 2018-07-21 DIAGNOSIS — K3189 Other diseases of stomach and duodenum: Secondary | ICD-10-CM | POA: Insufficient documentation

## 2018-07-21 DIAGNOSIS — D3A00 Benign carcinoid tumor of unspecified site: Secondary | ICD-10-CM

## 2018-07-21 DIAGNOSIS — E119 Type 2 diabetes mellitus without complications: Secondary | ICD-10-CM | POA: Diagnosis not present

## 2018-07-21 DIAGNOSIS — K929 Disease of digestive system, unspecified: Secondary | ICD-10-CM

## 2018-07-21 DIAGNOSIS — R198 Other specified symptoms and signs involving the digestive system and abdomen: Secondary | ICD-10-CM

## 2018-07-21 DIAGNOSIS — K219 Gastro-esophageal reflux disease without esophagitis: Secondary | ICD-10-CM | POA: Diagnosis not present

## 2018-07-21 DIAGNOSIS — D3A8 Other benign neuroendocrine tumors: Secondary | ICD-10-CM | POA: Diagnosis not present

## 2018-07-21 HISTORY — PX: ESOPHAGOGASTRODUODENOSCOPY: SHX5428

## 2018-07-21 HISTORY — PX: EUS: SHX5427

## 2018-07-21 HISTORY — DX: Personal history of other diseases of the digestive system: Z87.19

## 2018-07-21 HISTORY — DX: Anxiety disorder, unspecified: F41.9

## 2018-07-21 HISTORY — DX: Irritable bowel syndrome, unspecified: K58.9

## 2018-07-21 HISTORY — PX: FOREIGN BODY RETRIEVAL: CATH118241

## 2018-07-21 LAB — GLUCOSE, CAPILLARY: GLUCOSE-CAPILLARY: 108 mg/dL — AB (ref 70–99)

## 2018-07-21 SURGERY — UPPER ENDOSCOPIC ULTRASOUND (EUS) RADIAL
Anesthesia: Monitor Anesthesia Care

## 2018-07-21 MED ORDER — LACTATED RINGERS IV SOLN
INTRAVENOUS | Status: DC
  Administered 2018-07-21: 08:00:00 via INTRAVENOUS

## 2018-07-21 MED ORDER — SODIUM CHLORIDE 0.9 % IV SOLN: INTRAVENOUS | Status: DC

## 2018-07-21 MED ORDER — PROPOFOL 10 MG/ML IV BOLUS
INTRAVENOUS | Status: DC | PRN
  Administered 2018-07-21: 15 mg via INTRAVENOUS
  Administered 2018-07-21 (×3): 20 mg via INTRAVENOUS
  Administered 2018-07-21: 10 mg via INTRAVENOUS

## 2018-07-21 MED ORDER — ONDANSETRON HCL 4 MG/2ML IJ SOLN
INTRAMUSCULAR | Status: DC | PRN
  Administered 2018-07-21: 4 mg via INTRAVENOUS

## 2018-07-21 MED ORDER — PROPOFOL 500 MG/50ML IV EMUL
INTRAVENOUS | Status: DC | PRN
  Administered 2018-07-21: 100 ug/kg/min via INTRAVENOUS
  Administered 2018-07-21: 09:00:00 via INTRAVENOUS

## 2018-07-21 NOTE — Discharge Instructions (Signed)
YOU HAD AN ENDOSCOPIC PROCEDURE TODAY: Refer to the procedure report and other information in the discharge instructions given to you for any specific questions about what was found during the examination. If this information does not answer your questions, please call [place] office at [phone] to clarify.  ° °YOU SHOULD EXPECT: Some feelings of bloating in the abdomen. Passage of more gas than usual. Walking can help get rid of the air that was put into your GI tract during the procedure and reduce the bloating. If you had a lower endoscopy (such as a colonoscopy or flexible sigmoidoscopy) you may notice spotting of blood in your stool or on the toilet paper. Some abdominal soreness may be present for a day or two, also. ° °DIET: Your first meal following the procedure should be a light meal and then it is ok to progress to your normal diet. A half-sandwich or bowl of soup is an example of a good first meal. Heavy or fried foods are harder to digest and may make you feel nauseous or bloated. Drink plenty of fluids but you should avoid alcoholic beverages for 24 hours. If you had a esophageal dilation, please see attached instructions for diet.   ° °ACTIVITY: Your care partner should take you home directly after the procedure. You should plan to take it easy, moving slowly for the rest of the day. You can resume normal activity the day after the procedure however YOU SHOULD NOT DRIVE, use power tools, machinery or perform tasks that involve climbing or major physical exertion for 24 hours (because of the sedation medicines used during the test).  ° °SYMPTOMS TO REPORT IMMEDIATELY: °A gastroenterologist can be reached at any hour. Please call [phone]  for any of the following symptoms:  °Following lower endoscopy (colonoscopy, flexible sigmoidoscopy) °Excessive amounts of blood in the stool  °Significant tenderness, worsening of abdominal pains  °Swelling of the abdomen that is new, acute  °Fever of 100° or  higher  °Following upper endoscopy (EGD, EUS, ERCP, esophageal dilation) °Vomiting of blood or coffee ground material  °New, significant abdominal pain  °New, significant chest pain or pain under the shoulder blades  °Painful or persistently difficult swallowing  °New shortness of breath  °Black, tarry-looking or red, bloody stools ° °FOLLOW UP:  °If any biopsies were taken you will be contacted by phone or by letter within the next 1-3 weeks. Call [phone]  if you have not heard about the biopsies in 3 weeks.  °Please also call with any specific questions about appointments or follow up tests. ° °

## 2018-07-21 NOTE — Transfer of Care (Signed)
Immediate Anesthesia Transfer of Care Note  Patient: [place]  Procedure(s) Performed: UPPER ENDOSCOPIC ULTRASOUND (EUS) RADIAL (N/A ) ESOPHAGOGASTRODUODENOSCOPY (EGD) (N/A ) ENDOSCOPIC MUCOSAL RESECTION HEMOSTASIS CLIP PLACEMENT FOREIGN BODY RETRIEVAL (N/A )  Patient Location: Endoscopy Unit  Anesthesia Type:MAC  Level of Consciousness: awake, alert  and oriented  Airway & Oxygen Therapy: Patient Spontanous Breathing and Patient connected to nasal cannula oxygen  Post-op Assessment: Report given to RN, Post -op Vital signs reviewed and stable and Patient moving all extremities X 4  Post vital signs: Reviewed and stable  Last Vitals:  Vitals Value Taken Time  BP 90/57 07/21/2018  9:09 AM  Temp 36.4 C 07/21/2018  9:07 AM  Pulse 72 07/21/2018  9:09 AM  Resp 18 07/21/2018  9:09 AM  SpO2 97 % 07/21/2018  9:09 AM  Vitals shown include unvalidated device data.  Last Pain:  Vitals:   07/21/18 0907  TempSrc: Oral  PainSc: 0-No pain         Complications: No apparent anesthesia complications

## 2018-07-21 NOTE — Op Note (Signed)
[facility] Patient Name: Paige Hernandez Procedure Date : 07/21/2018 MRN: [id number] Attending MD: Justice Britain , MD Date of Birth: [birth year] CSN: [id number] Age: 62 Admit Type: Outpatient Procedure:                Upper EUS Indications:              Duodenal deformity on endoscopy/Subepithelial tumor                            vs. extrinsic compression, Submucosal tumor versus                            extrinsic mass found on endoscopy Providers:                Justice Britain, MD, Carlyn Reichert, RN, Charolette Child, Technician, Virgilio Belling. Beckner, CRNA Referring MD:              Medicines:                Monitored Anesthesia Care Complications:            No immediate complications. Estimated Blood Loss:     Estimated blood loss was minimal. Procedure:                Pre-Anesthesia Assessment:                           - Prior to the procedure, a History and Physical                            was performed, and patient medications and                            allergies were reviewed. The patient's tolerance of                            previous anesthesia was also reviewed. The risks                            and benefits of the procedure and the sedation                            options and risks were discussed with the patient.                            All questions were answered, and informed consent                            was obtained. Prior Anticoagulants: The patient has                            taken previous NSAID medication. ASA Grade  Assessment: III - A patient with severe systemic                            disease. After reviewing the risks and benefits,                            the patient was deemed in satisfactory condition to                            undergo the procedure.                           After obtaining informed consent, the endoscope was     passed under direct vision. Throughout the                            procedure, the patient's blood pressure, pulse, and                            oxygen saturations were monitored continuously. The                            [id number]-[id number] ([id number]) Olympus EGD Therapeutic was                            introduced through the mouth, and advanced to the                            second part of duodenum. The [id number] ([id number])                            Olympus Radial EUS was introduced through the                            mouth, and advanced to the duodenum for ultrasound                            examination from the stomach and duodenum. The                            upper EUS was accomplished without difficulty. The                            patient tolerated the procedure well. Scope In: Scope Out: Findings:      ENDOSCOPIC FINDING: :      No gross lesions were noted in the entire esophagus.      No gross lesions were noted in the entire examined stomach.      A single approximately, 10 mm submucosal nodule was found in the       duodenal bulb. After the EUS was completed as below, preparations were       made for mucosal resection. [place] Orise Gel was injected to raise the       lesion. Duette XL band ligator and snare mucosal resection was  performed. Resection and retrieval were complete. To close the defect       after mucosal resection, one hemostatic clip was successfully placed.       There was no bleeding during, or at the end, of the procedure.      No other gross lesions were noted in the second portion of the duodenum.      ENDOSONOGRAPHIC FINDING: :      A round intramural (subepithelial) lesion was found in the duodenal       bulb. The lesion was hypoechoic. Endosonographically, the lesion       appeared to originate from within the luminal interface/superficial       mucosa (Layer 1) and deep mucosa (Layer 2). The lesion measured 8 mm (in       maximum  thickness). The lesion also measured 7 mm in diameter. The outer       margins were smooth.      Endosonographic imaging in the visualized portion of the left lobe of       the liver showed no mass-lesion.      No malignant-appearing lymph nodes were visualized in the left gastric       region (level 17), gastrohepatic ligament (level 18), celiac region       (level 20), perigastric region and peripancreatic region.      The celiac region was visualized. Impression:               EGD Impression:                           - No gross lesions in esophagus.                           - No gross lesions in the stomach.                           - Submucosal nodule found in the duodenum. After,                            EUS, complete removal was accomplished via Snare                            Band Mucosal Resection. Clip was placed to close                            defect.                           - No other gross lesions in the second portion of                            the duodenum.                           EUS Impression:                           - An intramural (subepithelial) lesion was found in  the duodenal bulb. The lesion appeared to originate                            from within the luminal interface/superficial                            mucosa (Layer 1) and deep mucosa (Layer 2). Tissue                            was obtained from this exam, and results are                            pending. 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                           - No malignant-appearing lymph nodes were                            visualized in the left gastric region (level 17),                            gastrohepatic ligament (level 18), celiac region                            (level 20), perigastric region and peripancreatic                             region. Recommendation:           - The patient will be observed post-procedure,                            until all discharge criteria are met.                           - Discharge patient to home.                           - Patient has a contact number available for                            emergencies. The signs and symptoms of potential                            delayed complications were discussed with the                            patient. Return to normal activities tomorrow.  Written discharge instructions were provided to the                            patient.                           - Observe patient's clinical course.                           - Await path results.                           - Minimize NSAIDs for 1-week if possible to                            decrease risk of post EMR bleeding.                           - Based on hopeful complete resection, will plan                            surveillance thereafter. If complete resection not                            present, will determine next steps in follow up and                            management.                           - Repeat the upper endoscopic ultrasound for                            surveillance based on pathology results.                           - The findings and recommendations were discussed                            with the patient.                           - The findings and recommendations were discussed                            with the patient's family. Procedure Code(s):        --- Professional ---                           [phone], Esophagogastroduodenoscopy, flexible,                            transoral; with endoscopic mucosal resection                           43237, Esophagogastroduodenoscopy, flexible,  transoral; with endoscopic ultrasound examination                            limited to the  esophagus, stomach or duodenum, and                            adjacent structures Diagnosis Code(s):        --- Professional ---                           K31.89, Other diseases of stomach and duodenum                           I89.9, Noninfective disorder of lymphatic vessels                            and lymph nodes, unspecified                           K92.9, Disease of digestive system, unspecified CPT copyright 2018 American Medical Association. All rights reserved. The codes documented in this report are preliminary and upon coder review may  be revised to meet current compliance requirements. Justice Britain, MD 07/21/2018 9:23:24 AM Number of Addenda: 0
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
## 2018-07-21 NOTE — Anesthesia Preprocedure Evaluation (Addendum)
Anesthesia Evaluation  Patient identified by MRN, date of birth, ID band Patient awake    Reviewed: Allergy & Precautions, NPO status , Patient's Chart, lab work & pertinent test results  History of Anesthesia Complications Negative for: history of anesthetic complications  Airway Mallampati: II  TM Distance: >3 FB Neck ROM: Full    Dental  (+) Dental Advisory Given, Teeth Intact   Pulmonary neg pulmonary ROS,    breath sounds clear to auscultation       Cardiovascular negative cardio ROS   Rhythm:Regular Rate:Normal     Neuro/Psych  Headaches, PSYCHIATRIC DISORDERS Anxiety    GI/Hepatic Neg liver ROS, hiatal hernia, GERD  Medicated and Controlled, IBS    Endo/Other  diabetes, Type 2, Insulin Dependent, Oral Hypoglycemic AgentsMorbid obesity  Renal/GU negative Renal ROS     Musculoskeletal negative musculoskeletal ROS (+)   Abdominal   Peds  (+) ATTENTION DEFICIT DISORDER WITHOUT HYPERACTIVITY Hematology negative hematology ROS (+)   Anesthesia Other Findings   Reproductive/Obstetrics                           Anesthesia Physical Anesthesia Plan  ASA: III  Anesthesia Plan: MAC   Post-op Pain Management:    Induction: Intravenous  PONV Risk Score and Plan: 2 and Propofol infusion and Treatment may vary due to age or medical condition  Airway Management Planned: Nasal Cannula and Natural Airway  Additional Equipment: None  Intra-op Plan:   Post-operative Plan:   Informed Consent: I have reviewed the patients History and Physical, chart, labs and discussed the procedure including the risks, benefits and alternatives for the proposed anesthesia with the patient or authorized representative who has indicated his/her understanding and acceptance.     Plan Discussed with: CRNA and Anesthesiologist  Anesthesia Plan Comments:        Anesthesia Quick Evaluation

## 2018-07-22 NOTE — Anesthesia Postprocedure Evaluation (Signed)
Anesthesia Post Note  Patient: Paige Hernandez  Procedure(s) Performed: UPPER ENDOSCOPIC ULTRASOUND (EUS) RADIAL (N/A ) ESOPHAGOGASTRODUODENOSCOPY (EGD) (N/A ) ENDOSCOPIC MUCOSAL RESECTION HEMOSTASIS CLIP PLACEMENT FOREIGN BODY RETRIEVAL (N/A )     Patient location during evaluation: PACU Anesthesia Type: MAC Level of consciousness: awake and alert Pain management: pain level controlled Vital Signs Assessment: post-procedure vital signs reviewed and stable Respiratory status: spontaneous breathing, nonlabored ventilation and respiratory function stable Cardiovascular status: stable and blood pressure returned to baseline Anesthetic complications: no    Last Vitals:  Vitals:   07/21/18 0910 07/21/18 0915  BP: (!) 90/57 (!) 100/58  Pulse: 75 64  Resp: 19 (!) 23  Temp:    SpO2: 98% 100%    Last Pain:  Vitals:   07/21/18 0915  TempSrc:   PainSc: 0-No pain                 Audry Pili

## 2018-07-23 ENCOUNTER — Encounter (HOSPITAL_COMMUNITY): Payer: Self-pay | Admitting: Gastroenterology

## 2018-07-23 DIAGNOSIS — M25521 Pain in right elbow: Secondary | ICD-10-CM | POA: Diagnosis not present

## 2018-08-04 ENCOUNTER — Telehealth: Payer: Self-pay | Admitting: Gastroenterology

## 2018-08-04 NOTE — Telephone Encounter (Signed)
The pt was advised that Dr Rush Landmark will be contacting her later today.  She states she is very happy with her treatment within our office and is looking forward to speaking with Dr Rush Landmark

## 2018-08-04 NOTE — Telephone Encounter (Signed)
Patty, Please let patient know I will try and reach her with results and next steps later today. Thank you. Chester Holstein

## 2018-08-04 NOTE — Telephone Encounter (Signed)
Left voicemail for patient to call back. Will attempt callback this afternoon or tomorrow.

## 2018-08-04 NOTE — Telephone Encounter (Signed)
Ok thank you 

## 2018-08-05 ENCOUNTER — Other Ambulatory Visit: Payer: Self-pay

## 2018-08-05 ENCOUNTER — Encounter: Payer: Self-pay | Admitting: Gastroenterology

## 2018-08-05 DIAGNOSIS — C7A00 Malignant carcinoid tumor of unspecified site: Secondary | ICD-10-CM

## 2018-08-05 MED ORDER — FAMOTIDINE 40 MG PO TABS: 40.0000 mg | ORAL_TABLET | Freq: Two times a day (BID) | ORAL | 3 refills | Status: DC

## 2018-08-05 NOTE — Telephone Encounter (Signed)
I called and spoke with the patient on 12/2.  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  We spent a good amount of time talking about the risks of potential lymph node metastasis in neuroendocrine tumors.  We have much better data for a larger lesion is greater than 2 cm and for neuroendocrine tumors that are of a high-grade proliferative index that those types of lesions are best served by surgery and lymph node dissection.  This lesion was on the border of approximately 1 cm but not significantly more than that.  In the past we would consider active surveillance to evaluate if any residual or recurrent neuroendocrine tumor returned with follow-up endoscopy and ultrasound endoscopy over the course of 5 years as well as a consideration of MR imaging to ensure that no new lymph nodes have developed. This area with less than 1.5 cm lesions is not clearly delineated in the literature however due to concerns for the possibility of metastases and lymph nodes it is worthwhile to think about a referral to surgery for consideration of duodenectomy and lymph node resection. I will discuss this patient with our surgical colleagues to consider referral to have a discussion about potential next steps.  I think the decision tree will come down to whether the patient wants to accept the possible risk of lymph node metastasis being low but not 0% in this low proliferation index neuroendocrine tumor that is sub-1.5 cm with positive margins versus complete resection of the duodenum. After discussion with surgery he will determine final steps in planning. I would like the patient to remain on PPI therapy for at least 6 weeks to heal the mucosal resection site.  At that point I will asked Dr. not undergone to try to have the patient off of PPI for total of 1 to 2 weeks with the use  of H2 receptor antagonist as necessary for heartburn symptoms.  At which point I would then ask patient to have a chromogranin drawn so that we can see what the level looks like while being off of PPI and after resection. Patient appreciative for our 15-minute discussion on the telephone and she is happy to move forward with what ever we all think he is best for her health.   Justice Britain, MD [facility] Advanced [facility]oscopy Office # [phone]

## 2018-08-05 NOTE — Telephone Encounter (Signed)
The patient is advised. She will take her BID Nexium until 09/02/18. She will return for the lab Chromogranin A on 09/17/18.  She asks what can she take during the 2 week period for her "reflux, heartburn indigestion symptoms."

## 2018-08-05 NOTE — Telephone Encounter (Signed)
KN, I think trying to keep her on PPI for another 3 to 4 weeks and then trying to have her come off of it for 2 weeks and get a chromogranin is probably a step for [place] to see what her true chromogranin state is now post resection and off PPI.  I told the patient that you would reach out to her via your nurse in the coming weeks to determine the timing of that. We will finalize a plan after discussions with surgery and follow-up chromogranin. Patty, please place a referral to surgery specifically to Dr. Barry Dienes.  I have already sent her an email/inbox message to discuss his case further.

## 2018-08-05 NOTE — Addendum Note (Signed)
Addended by: Marlon Pel on: 08/05/2018 03:54 PM   Modules accepted: Orders

## 2018-08-05 NOTE — Telephone Encounter (Signed)
Beth, can you please advise patient to come off PPI next month for 2 weeks and will recheck Chromagranin level. Thanks

## 2018-08-05 NOTE — Telephone Encounter (Signed)
She can take Pepcid 40mg  BID and Gaviscon after meals as needed TID. Thanks

## 2018-08-05 NOTE — Telephone Encounter (Signed)
Patient notified of new recommendations rx sent

## 2018-08-08 ENCOUNTER — Other Ambulatory Visit: Payer: Self-pay | Admitting: Internal Medicine

## 2018-08-12 NOTE — Telephone Encounter (Signed)
CPE June 2019 Last refilled 04/10/18 escitalopram (LEXAPRO) 10 MG tablet TAKE 1 TABLET BY MOUTH DAILY, Normal  Dispense: 90 tablet  Refills: 0 ordered  Pharmacy: [facility], [place] - [id number] (Ph: [phone])  Order Details Ordered on: 04/10/18  Authorizing provider: Burnis Medin, MD   Please advise Dr Regis Bill, thanks.

## 2018-08-15 DIAGNOSIS — C7A8 Other malignant neuroendocrine tumors: Secondary | ICD-10-CM | POA: Diagnosis not present

## 2018-08-18 ENCOUNTER — Other Ambulatory Visit (HOSPITAL_COMMUNITY): Payer: Self-pay | Admitting: General Surgery

## 2018-08-18 DIAGNOSIS — C7A8 Other malignant neuroendocrine tumors: Secondary | ICD-10-CM

## 2018-08-28 ENCOUNTER — Telehealth: Payer: Self-pay | Admitting: Gastroenterology
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
## 2018-08-28 NOTE — Telephone Encounter (Signed)
Ok

## 2018-08-28 NOTE — Telephone Encounter (Signed)
Pain in the lower right goin area. She can feel a hard knot there. It is uncomfortable when she pushes it. "Feels like a giant worm under my skin." She doesn't feel it is infection of her skin. She is familiar with dermal infections in this area. Uses Loritimin close to daily for "years." Does not inject her medication in this area. She has made an appointment with her PCP for this. Encouraged to keep her PCP appointment.

## 2018-08-29 ENCOUNTER — Ambulatory Visit: Payer: BLUE CROSS/BLUE SHIELD | Admitting: Internal Medicine

## 2018-08-29 ENCOUNTER — Encounter: Payer: Self-pay | Admitting: Internal Medicine

## 2018-08-29 VITALS — BP 118/72 | HR 73 | Temp 97.8°F | Wt 236.6 lb

## 2018-08-29 DIAGNOSIS — Z79899 Other long term (current) drug therapy: Secondary | ICD-10-CM

## 2018-08-29 DIAGNOSIS — F40243 Fear of flying: Secondary | ICD-10-CM | POA: Diagnosis not present

## 2018-08-29 DIAGNOSIS — L989 Disorder of the skin and subcutaneous tissue, unspecified: Secondary | ICD-10-CM

## 2018-08-29 MED ORDER — ALPRAZOLAM 0.25 MG PO TABS: ORAL_TABLET | ORAL | 0 refills | Status: DC

## 2018-08-29 MED ORDER — DOXYCYCLINE HYCLATE 100 MG PO TABS: 100.0000 mg | ORAL_TABLET | Freq: Two times a day (BID) | ORAL | 0 refills | Status: DC

## 2018-08-29 NOTE — Patient Instructions (Addendum)
This is not a hernia .    I think t hi is a skin condition possible from trauma ans  Irritation and may be a healing boil but seems to be related  To  Low grade trauma?     Avoid trauma and keep local care   But if becomes  Red hot tender  Can add an antibiotic for  Possible skin infection.   Have a good trip.

## 2018-09-05 ENCOUNTER — Encounter (HOSPITAL_COMMUNITY): Payer: Self-pay

## 2018-09-05 ENCOUNTER — Encounter (HOSPITAL_COMMUNITY): Payer: BLUE CROSS/BLUE SHIELD

## 2018-09-05 ENCOUNTER — Other Ambulatory Visit: Payer: Self-pay | Admitting: General Surgery

## 2018-09-05 DIAGNOSIS — C7A8 Other malignant neuroendocrine tumors: Secondary | ICD-10-CM

## 2018-09-17 ENCOUNTER — Other Ambulatory Visit: Payer: BLUE CROSS/BLUE SHIELD

## 2018-09-17 DIAGNOSIS — C7A00 Malignant carcinoid tumor of unspecified site: Secondary | ICD-10-CM | POA: Diagnosis not present

## 2018-09-18 LAB — CHROMOGRANIN A: Chromogranin A (ng/mL): 167.8 ng/mL — ABNORMAL HIGH (ref 0.0–101.8)

## 2018-09-19 ENCOUNTER — Encounter (HOSPITAL_COMMUNITY): Payer: BLUE CROSS/BLUE SHIELD

## 2018-09-22 DIAGNOSIS — H1032 Unspecified acute conjunctivitis, left eye: Secondary | ICD-10-CM | POA: Diagnosis not present

## 2018-10-02 ENCOUNTER — Ambulatory Visit (HOSPITAL_COMMUNITY)
Admission: RE | Admit: 2018-10-02 | Discharge: 2018-10-02 | Disposition: A | Discharge status: Home / Self Care | Payer: BLUE CROSS/BLUE SHIELD | Source: Ambulatory Visit | Attending: General Surgery | Admitting: General Surgery

## 2018-10-02 ENCOUNTER — Other Ambulatory Visit: Payer: Self-pay | Admitting: Internal Medicine

## 2018-10-02 DIAGNOSIS — C7A010 Malignant carcinoid tumor of the duodenum: Secondary | ICD-10-CM | POA: Diagnosis not present

## 2018-10-02 DIAGNOSIS — C7A8 Other malignant neuroendocrine tumors: Secondary | ICD-10-CM

## 2018-10-03 NOTE — Telephone Encounter (Signed)
I called and spoke with the patient and updated her about the results of the dotatate scan.  She is very happy about the results not showing any evidence of narrowing the disease.  She is a bit confused as to the reason why her chromogranin elevated however when it was last checked.  Right now she remains off PPI but she is taking Pepcid infrequently for symptoms. I told her that after we finalize a discussion with Dr. Barry Dienes we will come up with a plan in the next 1 to 2 weeks as to the consideration of high risk surveillance with EUS and imaging studies versus other interventions. The patient also wanted to let Dr. Silverio Decamp know that she has been having some rectal discomfort which she presumed was from a recurrent small fissure as well as her hemorrhoids.  She is using Preparation H as well as lidocaine jelly to optimize her stools and her rectal discomfort.  She has noted infrequent bleeding but nothing to the point of which she had had previously.  This is not a huge concern for her but she wanted Dr. Silverio Decamp to know about it and I will relay this to her as well. I told her we will be in touch in the next 2 weeks once we have finalized a plan of action for her. She was appreciative for the call back.  Justice Britain, MD [facility] Office # [phone]

## 2018-10-10 ENCOUNTER — Encounter: Payer: Self-pay | Admitting: Gastroenterology

## 2018-10-10 NOTE — Progress Notes (Signed)
I called and spoke with the patient. Dr. Barry Dienes and Dr. Silverio Decamp and I have decided on a high risk surveillance for this patient. We will plan to repeat her endoscopic ultrasound and plan to resect any recurrent/persistent tissue and/or biopsy with a previous scar site to evaluate whether there is any persistent neuroendocrine tumor.  We will also perform EUS to evaluate and ensure there is no evidence of any lymph node disease.  In 6 months time from her last dotatate scan we will plan on repeating that. After this first cycle of surveillance is complete we will decide about further follow-ups in the future and the surveillance protocol thereafter. We also discussed repeating chromogranin in approximately 4 to 5 weeks.  I would like her to continue to be off of PPI as well as trying not be on a H2 RA medication.  She is okay to use Gaviscon as this should not falsely elevate the chromogranin level. We will plan to recheck the chromogranin as noted above.  If this remains elevated I will consider the possibility of a 5 HIAA urine study in the future as well. We should try and do the chromogranin before the EUS if possible. I will relay this information to Dr. Silverio Decamp so that the patient can follow-up with Dr. Silverio Decamp in regards to her clinical issues of hemorrhoids and other GI symptoms. The patient was appreciative for the call back and with our plan of action.  Justice Britain, MD [facility] Office # [phone]

## 2018-10-13 ENCOUNTER — Other Ambulatory Visit: Payer: Self-pay

## 2018-10-13 DIAGNOSIS — D3A00 Benign carcinoid tumor of unspecified site: Secondary | ICD-10-CM

## 2018-10-13 DIAGNOSIS — C7A00 Malignant carcinoid tumor of unspecified site: Secondary | ICD-10-CM

## 2018-10-13 DIAGNOSIS — D3A010 Benign carcinoid tumor of the duodenum: Secondary | ICD-10-CM

## 2018-10-13 NOTE — Progress Notes (Signed)
The pt has been scheduled for lower EUS /EMR on 11/03/18 at [facility] at 1:30 pm.  She was sent instructions via My Chart her preferred method.

## 2018-10-13 NOTE — Progress Notes (Signed)
Ok thanks [employer]. Beth, please bring her in for a office visit this month. Thanks

## 2018-10-21 ENCOUNTER — Ambulatory Visit: Payer: BLUE CROSS/BLUE SHIELD | Admitting: Internal Medicine

## 2018-11-03 ENCOUNTER — Ambulatory Visit: Payer: BLUE CROSS/BLUE SHIELD | Admitting: Gastroenterology

## 2018-11-03 ENCOUNTER — Other Ambulatory Visit: Payer: Self-pay | Admitting: Internal Medicine

## 2018-11-04 ENCOUNTER — Telehealth: Payer: Self-pay | Admitting: Gastroenterology

## 2018-11-04 NOTE — Telephone Encounter (Signed)
Pls call pt, she has some questions regarding procedure scheduled at the hospital.

## 2018-11-04 NOTE — Telephone Encounter (Signed)
The pt wanted to see if her procedure could be earlier on her appt day. She was advised that there are no further appts that day.

## 2018-11-06 ENCOUNTER — Ambulatory Visit: Payer: BLUE CROSS/BLUE SHIELD | Admitting: Gastroenterology

## 2018-11-12 ENCOUNTER — Encounter: Payer: Self-pay | Admitting: Gastroenterology

## 2018-11-12 ENCOUNTER — Other Ambulatory Visit: Payer: Self-pay

## 2018-11-12 ENCOUNTER — Ambulatory Visit (INDEPENDENT_AMBULATORY_CARE_PROVIDER_SITE_OTHER): Payer: BLUE CROSS/BLUE SHIELD | Admitting: Gastroenterology

## 2018-11-12 ENCOUNTER — Other Ambulatory Visit (INDEPENDENT_AMBULATORY_CARE_PROVIDER_SITE_OTHER): Payer: BLUE CROSS/BLUE SHIELD

## 2018-11-12 ENCOUNTER — Other Ambulatory Visit: Payer: BLUE CROSS/BLUE SHIELD

## 2018-11-12 VITALS — BP 118/82 | HR 70 | Ht 63.25 in | Wt 240.4 lb

## 2018-11-12 DIAGNOSIS — R197 Diarrhea, unspecified: Secondary | ICD-10-CM

## 2018-11-12 DIAGNOSIS — D508 Other iron deficiency anemias: Secondary | ICD-10-CM

## 2018-11-12 DIAGNOSIS — R152 Fecal urgency: Secondary | ICD-10-CM

## 2018-11-12 DIAGNOSIS — K219 Gastro-esophageal reflux disease without esophagitis: Secondary | ICD-10-CM

## 2018-11-12 DIAGNOSIS — C7A8 Other malignant neuroendocrine tumors: Secondary | ICD-10-CM | POA: Diagnosis not present

## 2018-11-12 DIAGNOSIS — D3A010 Benign carcinoid tumor of the duodenum: Secondary | ICD-10-CM | POA: Diagnosis not present

## 2018-11-12 LAB — IBC PANEL
Iron: 74 ug/dL (ref 42–145)
Saturation Ratios: 25.4 % (ref 20.0–50.0)
Transferrin: 208 mg/dL — ABNORMAL LOW (ref 212.0–360.0)

## 2018-11-12 MED ORDER — COLESTIPOL HCL 1 G PO TABS: 1.0000 g | ORAL_TABLET | Freq: Two times a day (BID) | ORAL | 3 refills | Status: DC

## 2018-11-12 MED ORDER — METOCLOPRAMIDE HCL 10 MG PO TABS: 10.0000 mg | ORAL_TABLET | Freq: Three times a day (TID) | ORAL | 2 refills | Status: DC | PRN
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
## 2018-11-12 NOTE — Patient Instructions (Addendum)
Go to the basement for labs  STOP NSAIDS  STOP oral iron  Continue to hold PPI and H2 blocker   Take Benefiber 1 teaspoon three times a day  We will send Reglan and Colestid to your pharmacy     Kegel Exercises Kegel exercises help strengthen the muscles that support the rectum, vagina, small intestine, bladder, and uterus. Doing Kegel exercises can help:  Improve bladder and bowel control.  Improve sexual response.  Reduce problems and discomfort during pregnancy. Kegel exercises involve squeezing your pelvic floor muscles, which are the same muscles you squeeze when you try to stop the flow of urine. The exercises can be done while sitting, standing, or lying down, but it is best to vary your position. Exercises 1. Squeeze your pelvic floor muscles tight. You should feel a tight lift in your rectal area. If you are a female, you should also feel a tightness in your vaginal area. Keep your stomach, buttocks, and legs relaxed. 2. Hold the muscles tight for up to 10 seconds. 3. Relax your muscles. Repeat this exercise 50 times a day or as many times as told by your health care provider. Continue to do this exercise for at least 4-6 weeks or for as long as told by your health care provider. This information is not intended to replace advice given to you by your health care provider. Make sure you discuss any questions you have with your health care provider. Document Released: 08/06/2012 Document Revised: 12/31/2016 Document Reviewed: 07/10/2015 Elsevier Interactive Patient Education  2019 [employer].   I appreciate the  opportunity to care for you  Thank You   Harl Bowie , MD

## 2018-11-13 LAB — CHROMOGRANIN A: CHROMOGRANIN A (NG/ML): 79.3 ng/mL (ref 0.0–101.8)

## 2018-11-13 LAB — CBC WITH DIFFERENTIAL/PLATELET
Basophils Absolute: 0 10*3/uL (ref 0.0–0.2)
Basos: 0 %
EOS (ABSOLUTE): 0.3 10*3/uL (ref 0.0–0.4)
Eos: 4 %
Hematocrit: 46.3 % (ref 34.0–46.6)
Hemoglobin: 15 g/dL (ref 11.1–15.9)
Immature Grans (Abs): 0 10*3/uL (ref 0.0–0.1)
Immature Granulocytes: 0 %
LYMPHS ABS: 2 10*3/uL (ref 0.7–3.1)
Lymphs: 27 %
MCH: 28.6 pg (ref 26.6–33.0)
MCHC: 32.4 g/dL (ref 31.5–35.7)
MCV: 88 fL (ref 79–97)
MONOS ABS: 0.6 10*3/uL (ref 0.1–0.9)
Monocytes: 8 %
Neutrophils Absolute: 4.5 10*3/uL (ref 1.4–7.0)
Neutrophils: 61 %
Platelets: 192 10*3/uL (ref 150–450)
RBC: 5.24 x10E6/uL (ref 3.77–5.28)
RDW: 13.1 % (ref 11.7–15.4)
WBC: 7.3 10*3/uL (ref 3.4–10.8)

## 2018-11-13 LAB — FERRITIN: Ferritin: 55 ng/mL (ref 15–150)

## 2018-11-18 ENCOUNTER — Telehealth: Payer: Self-pay

## 2018-11-18 NOTE — Telephone Encounter (Signed)
The pt was moved to 4/27 730 [facility] pt re instructed

## 2018-11-18 NOTE — Telephone Encounter (Signed)
Per Dr Rush Landmark need to move the case scheduled for 3/23 due to the COVID-19 virus

## 2018-12-08 ENCOUNTER — Other Ambulatory Visit: Payer: Self-pay | Admitting: Internal Medicine

## 2018-12-29 ENCOUNTER — Encounter (HOSPITAL_COMMUNITY): Admission: RE | Payer: Self-pay | Source: Home / Self Care

## 2018-12-29 ENCOUNTER — Ambulatory Visit (HOSPITAL_COMMUNITY)
Admission: RE | Admit: 2018-12-29 | Payer: BLUE CROSS/BLUE SHIELD | Source: Home / Self Care | Admitting: Gastroenterology

## 2018-12-29 SURGERY — UPPER ESOPHAGEAL ENDOSCOPIC ULTRASOUND (EUS)
Anesthesia: Monitor Anesthesia Care

## 2019-01-02 ENCOUNTER — Other Ambulatory Visit: Payer: Self-pay

## 2019-01-02 ENCOUNTER — Encounter: Payer: Self-pay | Admitting: Internal Medicine

## 2019-01-02 ENCOUNTER — Ambulatory Visit (INDEPENDENT_AMBULATORY_CARE_PROVIDER_SITE_OTHER): Payer: BLUE CROSS/BLUE SHIELD | Admitting: Internal Medicine

## 2019-01-02 DIAGNOSIS — L255 Unspecified contact dermatitis due to plants, except food: Secondary | ICD-10-CM | POA: Diagnosis not present

## 2019-01-02 MED ORDER — FLUOCINONIDE 0.05 % EX CREA: 1.0000 "application " | TOPICAL_CREAM | Freq: Two times a day (BID) | CUTANEOUS | 1 refills | Status: DC
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
## 2019-01-09 ENCOUNTER — Telehealth: Payer: Self-pay | Admitting: Internal Medicine

## 2019-01-09 ENCOUNTER — Other Ambulatory Visit: Payer: Self-pay | Admitting: Internal Medicine

## 2019-01-09 MED ORDER — PREDNISONE 20 MG PO TABS: ORAL_TABLET | ORAL | 0 refills | Status: DC

## 2019-01-09 NOTE — Telephone Encounter (Signed)
Will send in prednisone taper

## 2019-01-09 NOTE — Telephone Encounter (Signed)
Copied from [place] #[id number]. Topic: General - Other >> Jan 09, 2019 11:49 AM Paige Hernandez wrote: Reason for CRM: pt calling and stating her dermatitis is continuing spreading and she would like to go a step further and maybe get some prednisone to help to control it. Pt also stated her blood sugars has been really Hernandez. Please call pt to advise of next step.

## 2019-01-09 NOTE — Telephone Encounter (Signed)
Pt states no fever. Pt states it is spreading to her tummy, under bra line, some of ankle now on face on forehead, underneath eyes, and chin, going from waist line towards back pt states that ointment has helped some but still spreading from original patch on left arm. Pt states sugars have been under 200 been 127. Pt will be sending in pictures through mychart. Pt wants predisone sent to [facility] in [place]

## 2019-01-09 NOTE — Telephone Encounter (Signed)
Pt has been notified of prescription being sent in

## 2019-01-12 ENCOUNTER — Telehealth: Payer: Self-pay | Admitting: *Deleted

## 2019-01-12 NOTE — Telephone Encounter (Signed)
Copied from [place] [id number]. Topic: General - Other >> Jan 09, 2019  5:28 PM Mcneil, Ja-Kwan wrote: Reason for CRM: Pt stated the Rx for predniSONE (DELTASONE) 20 MG tablet does not come in a 24 pack as the Rx was written. Pt stated the Rx only comes in packs of either 21 or 48 so a new Rx would need to be submitted to her pharmacy.

## 2019-01-12 NOTE — Telephone Encounter (Signed)
Pharmacy was called and rx was fixed

## 2019-01-12 NOTE — Telephone Encounter (Signed)
Unsigned Note    Copied from [place] [phone]. Topic: General - Other >> Jan 09, 2019  5:28 PM Mcneil, Ja-Kwan wrote: Reason for CRM: Pt stated the Rx for predniSONE (DELTASONE) 20 MG tablet does not come in a 24 pack as the Rx was written. Pt stated the Rx only comes in packs of either 21 or 48 so a new Rx would need to be submitted to her pharmacy.

## 2019-01-12 NOTE — Telephone Encounter (Signed)
I didn't prescribe a dose pack  I rxed a  Bottle of pills to take   dosinng per day   This is not a  rx for the refill request  Bu other providers .     Please have them fill the rx as pills per day

## 2019-01-12 NOTE — Telephone Encounter (Signed)
Please advise 

## 2019-01-14 NOTE — Telephone Encounter (Signed)
Pharmacy is requesting clarification. Please advise, should the rx be for 20 tabs? It was written for 24 tabs.

## 2019-01-15 ENCOUNTER — Ambulatory Visit (INDEPENDENT_AMBULATORY_CARE_PROVIDER_SITE_OTHER): Payer: BLUE CROSS/BLUE SHIELD | Admitting: Internal Medicine

## 2019-01-15 ENCOUNTER — Other Ambulatory Visit: Payer: Self-pay | Admitting: Internal Medicine

## 2019-01-15 ENCOUNTER — Encounter: Payer: Self-pay | Admitting: Internal Medicine

## 2019-01-15 ENCOUNTER — Other Ambulatory Visit: Payer: Self-pay

## 2019-01-15 VITALS — BP 118/60 | HR 79 | Temp 98.2°F | Ht 63.25 in | Wt 232.0 lb

## 2019-01-15 DIAGNOSIS — E785 Hyperlipidemia, unspecified: Secondary | ICD-10-CM

## 2019-01-15 DIAGNOSIS — Z6841 Body Mass Index (BMI) 40.0 and over, adult: Secondary | ICD-10-CM | POA: Diagnosis not present

## 2019-01-15 DIAGNOSIS — E1165 Type 2 diabetes mellitus with hyperglycemia: Secondary | ICD-10-CM | POA: Diagnosis not present

## 2019-01-15 DIAGNOSIS — Z794 Long term (current) use of insulin: Secondary | ICD-10-CM

## 2019-01-15 LAB — POCT GLYCOSYLATED HEMOGLOBIN (HGB A1C): Hemoglobin A1C: 9.2 % — AB (ref 4.0–5.6)

## 2019-01-15 MED ORDER — CONTOUR NEXT TEST VI STRP: ORAL_STRIP | 0 refills | Status: DC

## 2019-01-15 MED ORDER — INSULIN LISPRO (1 UNIT DIAL) 100 UNIT/ML (KWIKPEN): 6.0000 [IU] | PEN_INJECTOR | Freq: Three times a day (TID) | SUBCUTANEOUS | 3 refills | Status: DC

## 2019-01-15 MED ORDER — CONTOUR NEXT TEST VI STRP: ORAL_STRIP | 3 refills | Status: AC

## 2019-01-15 MED ORDER — INSULIN PEN NEEDLE 32G X 4 MM MISC: 2 refills | Status: DC
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
## 2019-01-15 NOTE — Patient Instructions (Addendum)
Please continue: - Trulicity 1.5 mg weekly - Invokana 300 mg in am - Toujeo 38 units at bedtime  Please take: - Humalog  6-10 units 15 min before main meals   Please come back for a follow-up appointment in 3 months.

## 2019-01-16 ENCOUNTER — Encounter

## 2019-01-21 ENCOUNTER — Telehealth: Payer: Self-pay

## 2019-01-21 ENCOUNTER — Encounter: Payer: Self-pay | Admitting: Internal Medicine

## 2019-01-21 MED ORDER — INSULIN ASPART 100 UNIT/ML FLEXPEN: PEN_INJECTOR | SUBCUTANEOUS | 11 refills | Status: DC

## 2019-01-21 NOTE — Telephone Encounter (Signed)
Novolog sent

## 2019-01-21 NOTE — Telephone Encounter (Signed)
OK to call in Novolog - same doses. °

## 2019-01-21 NOTE — Telephone Encounter (Signed)
Humalog was Rx'd 5/15/22020 however pharmacy has just notified us it is not covered.  Please advise if PA needed.

## 2019-01-27 DIAGNOSIS — H903 Sensorineural hearing loss, bilateral: Secondary | ICD-10-CM | POA: Diagnosis not present

## 2019-01-27 DIAGNOSIS — H9313 Tinnitus, bilateral: Secondary | ICD-10-CM | POA: Diagnosis not present

## 2019-02-01 ENCOUNTER — Other Ambulatory Visit: Payer: Self-pay | Admitting: Internal Medicine

## 2019-02-05 ENCOUNTER — Other Ambulatory Visit: Payer: Self-pay

## 2019-02-05 ENCOUNTER — Telehealth: Payer: Self-pay

## 2019-02-05 DIAGNOSIS — C7A8 Other malignant neuroendocrine tumors: Secondary | ICD-10-CM

## 2019-02-05 NOTE — Telephone Encounter (Signed)
appt made for EUS at [facility] on 6/29 and COVID testing on 6/26 at [facility]

## 2019-02-05 NOTE — Telephone Encounter (Signed)
EUS scheduled, pt instructed and medications reviewed.  Patient instructions mailed to home.  Patient to call with any questions or concerns. The pt has been advised of the information and verbalized understanding of the COVID testing instructions and the incorrect date of 6/26 to 6/25.  The pt is also aware to take reglan the night

## 2019-02-06 ENCOUNTER — Other Ambulatory Visit: Payer: Self-pay | Admitting: Internal Medicine

## 2019-02-07 ENCOUNTER — Other Ambulatory Visit: Payer: Self-pay | Admitting: Gastroenterology

## 2019-02-09 ENCOUNTER — Ambulatory Visit (INDEPENDENT_AMBULATORY_CARE_PROVIDER_SITE_OTHER): Payer: BC Managed Care – PPO

## 2019-02-09 ENCOUNTER — Ambulatory Visit (INDEPENDENT_AMBULATORY_CARE_PROVIDER_SITE_OTHER): Payer: BC Managed Care – PPO | Admitting: Podiatry

## 2019-02-09 ENCOUNTER — Encounter: Payer: Self-pay | Admitting: Podiatry

## 2019-02-09 ENCOUNTER — Other Ambulatory Visit: Payer: Self-pay

## 2019-02-09 VITALS — Temp 97.2°F

## 2019-02-09 DIAGNOSIS — M722 Plantar fascial fibromatosis: Secondary | ICD-10-CM

## 2019-02-09 DIAGNOSIS — M7751 Other enthesopathy of right foot: Secondary | ICD-10-CM | POA: Diagnosis not present

## 2019-02-09 DIAGNOSIS — M21619 Bunion of unspecified foot: Secondary | ICD-10-CM

## 2019-02-09 MED ORDER — MELOXICAM 7.5 MG PO TABS: 7.5000 mg | ORAL_TABLET | Freq: Every day | ORAL | 0 refills | Status: DC
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
## 2019-02-09 NOTE — Patient Instructions (Signed)
Plantar Fasciitis (Heel Spur Syndrome) with Rehab The plantar fascia is a fibrous, ligament-like, soft-tissue structure that spans the bottom of the foot. Plantar fasciitis is a condition that causes pain in the foot due to inflammation of the tissue. SYMPTOMS   Pain and tenderness on the underneath side of the foot.  Pain that worsens with standing or walking. CAUSES  Plantar fasciitis is caused by irritation and injury to the plantar fascia on the underneath side of the foot. Common mechanisms of injury include:  Direct trauma to bottom of the foot.  Damage to a small nerve that runs under the foot where the main fascia attaches to the heel bone.  Stress placed on the plantar fascia due to bone spurs. RISK INCREASES WITH:   Activities that place stress on the plantar fascia (running, jumping, pivoting, or cutting).  Poor strength and flexibility.  Improperly fitted shoes.  Tight calf muscles.  Flat feet.  Failure to warm-up properly before activity.  Obesity. PREVENTION  Warm up and stretch properly before activity.  Allow for adequate recovery between workouts.  Maintain physical fitness:  Strength, flexibility, and endurance.  Cardiovascular fitness.  Maintain a health body weight.  Avoid stress on the plantar fascia.  Wear properly fitted shoes, including arch supports for individuals who have flat feet.  PROGNOSIS  If treated properly, then the symptoms of plantar fasciitis usually resolve without surgery. However, occasionally surgery is necessary.  RELATED COMPLICATIONS   Recurrent symptoms that may result in a chronic condition.  Problems of the lower back that are caused by compensating for the injury, such as limping.  Pain or weakness of the foot during push-off following surgery.  Chronic inflammation, scarring, and partial or complete fascia tear, occurring more often from repeated injections.  TREATMENT  Treatment initially involves the  use of ice and medication to help reduce pain and inflammation. The use of strengthening and stretching exercises may help reduce pain with activity, especially stretches of the Achilles tendon. These exercises may be performed at home or with a therapist. Your caregiver may recommend that you use heel cups of arch supports to help reduce stress on the plantar fascia. Occasionally, corticosteroid injections are given to reduce inflammation. If symptoms persist for greater than 6 months despite non-surgical (conservative), then surgery may be recommended.   MEDICATION   If pain medication is necessary, then nonsteroidal anti-inflammatory medications, such as aspirin and ibuprofen, or other minor pain relievers, such as acetaminophen, are often recommended.  Do not take pain medication within 7 days before surgery.  Prescription pain relievers may be given if deemed necessary by your caregiver. Use only as directed and only as much as you need.  Corticosteroid injections may be given by your caregiver. These injections should be reserved for the most serious cases, because they may only be given a certain number of times.  HEAT AND COLD  Cold treatment (icing) relieves pain and reduces inflammation. Cold treatment should be applied for 10 to 15 minutes every 2 to 3 hours for inflammation and pain and immediately after any activity that aggravates your symptoms. Use ice packs or massage the area with a piece of ice (ice massage).  Heat treatment may be used prior to performing the stretching and strengthening activities prescribed by your caregiver, physical therapist, or athletic trainer. Use a heat pack or soak the injury in warm water.  SEEK IMMEDIATE MEDICAL CARE IF:  Treatment seems to offer no benefit, or the condition worsens.  Any medications   produce adverse side effects.  EXERCISES- RANGE OF MOTION (ROM) AND STRETCHING EXERCISES - Plantar Fasciitis (Heel Spur Syndrome) These exercises  may help you when beginning to rehabilitate your injury. Your symptoms may resolve with or without further involvement from your physician, physical therapist or athletic trainer. While completing these exercises, remember:   Restoring tissue flexibility helps normal motion to return to the joints. This allows healthier, less painful movement and activity.  An effective stretch should be held for at least 30 seconds.  A stretch should never be painful. You should only feel a gentle lengthening or release in the stretched tissue.  RANGE OF MOTION - Toe Extension, Flexion  Sit with your right / left leg crossed over your opposite knee.  Grasp your toes and gently pull them back toward the top of your foot. You should feel a stretch on the bottom of your toes and/or foot.  Hold this stretch for 10 seconds.  Now, gently pull your toes toward the bottom of your foot. You should feel a stretch on the top of your toes and or foot.  Hold this stretch for 10 seconds. Repeat  times. Complete this stretch 3 times per day.   RANGE OF MOTION - Ankle Dorsiflexion, Active Assisted  Remove shoes and sit on a chair that is preferably not on a carpeted surface.  Place right / left foot under knee. Extend your opposite leg for support.  Keeping your heel down, slide your right / left foot back toward the chair until you feel a stretch at your ankle or calf. If you do not feel a stretch, slide your bottom forward to the edge of the chair, while still keeping your heel down.  Hold this stretch for 10 seconds. Repeat 3 times. Complete this stretch 2 times per day.   STRETCH  Gastroc, Standing  Place hands on wall.  Extend right / left leg, keeping the front knee somewhat bent.  Slightly point your toes inward on your back foot.  Keeping your right / left heel on the floor and your knee straight, shift your weight toward the wall, not allowing your back to arch.  You should feel a gentle stretch  in the right / left calf. Hold this position for 10 seconds. Repeat 3 times. Complete this stretch 2 times per day.  STRETCH  Soleus, Standing  Place hands on wall.  Extend right / left leg, keeping the other knee somewhat bent.  Slightly point your toes inward on your back foot.  Keep your right / left heel on the floor, bend your back knee, and slightly shift your weight over the back leg so that you feel a gentle stretch deep in your back calf.  Hold this position for 10 seconds. Repeat 3 times. Complete this stretch 2 times per day.  STRETCH  Gastrocsoleus, Standing  Note: This exercise can place a lot of stress on your foot and ankle. Please complete this exercise only if specifically instructed by your caregiver.   Place the ball of your right / left foot on a step, keeping your other foot firmly on the same step.  Hold on to the wall or a rail for balance.  Slowly lift your other foot, allowing your body weight to press your heel down over the edge of the step.  You should feel a stretch in your right / left calf.  Hold this position for 10 seconds.  Repeat this exercise with a slight bend in your right /   left knee. Repeat 3 times. Complete this stretch 2 times per day.   STRENGTHENING EXERCISES - Plantar Fasciitis (Heel Spur Syndrome)  These exercises may help you when beginning to rehabilitate your injury. They may resolve your symptoms with or without further involvement from your physician, physical therapist or athletic trainer. While completing these exercises, remember:   Muscles can gain both the endurance and the strength needed for everyday activities through controlled exercises.  Complete these exercises as instructed by your physician, physical therapist or athletic trainer. Progress the resistance and repetitions only as guided.  STRENGTH - Towel Curls  Sit in a chair positioned on a non-carpeted surface.  Place your foot on a towel, keeping your heel  on the floor.  Pull the towel toward your heel by only curling your toes. Keep your heel on the floor. Repeat 3 times. Complete this exercise 2 times per day.  STRENGTH - Ankle Inversion  Secure one end of a rubber exercise band/tubing to a fixed object (table, pole). Loop the other end around your foot just before your toes.  Place your fists between your knees. This will focus your strengthening at your ankle.  Slowly, pull your big toe up and in, making sure the band/tubing is positioned to resist the entire motion.  Hold this position for 10 seconds.  Have your muscles resist the band/tubing as it slowly pulls your foot back to the starting position. Repeat 3 times. Complete this exercises 2 times per day.  Document Released: 08/20/2005 Document Revised: 11/12/2011 Document Reviewed: 12/02/2008 [facility] [employer] Patient Information 2014 [place], [place].   Meloxicam tablets What is this medicine? MELOXICAM (mel OX i cam) is a non-steroidal anti-inflammatory drug (NSAID). It is used to reduce swelling and to treat pain. It may be used for osteoarthritis, rheumatoid arthritis, or juvenile rheumatoid arthritis. This medicine may be used for other purposes; ask your health care provider or pharmacist if you have questions. COMMON BRAND NAME(S): Mobic What should I tell my health care provider before I take this medicine? They need to know if you have any of these conditions: -bleeding disorders -cigarette smoker -coronary artery bypass graft (CABG) surgery within the past 2 weeks -drink more than 3 alcohol-containing drinks per day -heart disease -high blood pressure -history of stomach bleeding -kidney disease -liver disease -lung or breathing disease, like asthma -stomach or intestine problems -an unusual or allergic reaction to meloxicam, aspirin, other NSAIDs, other medicines, foods, dyes, or preservatives -pregnant or trying to get pregnant -breast-feeding How should I use this  medicine? Take this medicine by mouth with a full glass of water. Follow the directions on the prescription label. You can take it with or without food. If it upsets your stomach, take it with food. Take your medicine at regular intervals. Do not take it more often than directed. Do not stop taking except on your doctor's advice. A special MedGuide will be given to you by the pharmacist with each prescription and refill. Be sure to read this information carefully each time. Talk to your pediatrician regarding the use of this medicine in children. While this drug may be prescribed for selected conditions, precautions do apply. Patients over 58 years old may have a stronger reaction and need a smaller dose. Overdosage: If you think you have taken too much of this medicine contact a poison control center or emergency room at once. NOTE: This medicine is only for you. Do not share this medicine with others. What if I miss a dose? If  you miss a dose, take it as soon as you can. If it is almost time for your next dose, take only that dose. Do not take double or extra doses. What may interact with this medicine? Do not take this medicine with any of the following medications: -cidofovir -ketorolac This medicine may also interact with the following medications: -aspirin and aspirin-like medicines -certain medicines for blood pressure, heart disease, irregular heart beat -certain medicines for depression, anxiety, or psychotic disturbances -certain medicines that treat or prevent blood clots like warfarin, enoxaparin, dalteparin, apixaban, dabigatran, rivaroxaban -cyclosporine -diuretics -methotrexate -other NSAIDs, medicines for pain and inflammation, like ibuprofen and naproxen -pemetrexed This list may not describe all possible interactions. Give your health care provider a list of all the medicines, herbs, non-prescription drugs, or dietary supplements you use. Also tell them if you smoke, drink  alcohol, or use illegal drugs. Some items may interact with your medicine. What should I watch for while using this medicine? Tell your doctor or healthcare professional if your symptoms do not start to get better or if they get worse. Do not take other medicines that contain aspirin, ibuprofen, or naproxen with this medicine. Side effects such as stomach upset, nausea, or ulcers may be more likely to occur. Many medicines available without a prescription should not be taken with this medicine. This medicine can cause ulcers and bleeding in the stomach and intestines at any time during treatment. This can happen with no warning and may cause death. There is increased risk with taking this medicine for a long time. Smoking, drinking alcohol, older age, and poor health can also increase risks. Call your doctor right away if you have stomach pain or blood in your vomit or stool. This medicine does not prevent heart attack or stroke. In fact, this medicine may increase the chance of a heart attack or stroke. The chance may increase with longer use of this medicine and in people who have heart disease. If you take aspirin to prevent heart attack or stroke, talk with your doctor or health care professional. What side effects may I notice from receiving this medicine? Side effects that you should report to your doctor or health care professional as soon as possible: -allergic reactions like skin rash, itching or hives, swelling of the face, lips, or tongue -nausea, vomiting -signs and symptoms of a blood clot such as breathing problems; changes in vision; chest pain; severe, sudden headache; pain, swelling, warmth in the leg; trouble speaking; sudden numbness or weakness of the face, arm, or leg -signs and symptoms of bleeding such as bloody or black, tarry stools; red or dark-brown urine; spitting up blood or brown material that looks like coffee grounds; red spots on the skin; unusual bruising or bleeding from  the eye, gums, or nose -signs and symptoms of liver injury like dark yellow or brown urine; general ill feeling or flu-like symptoms; light-colored stools; loss of appetite; nausea; right upper belly pain; unusually weak or tired; yellowing of the eyes or skin -signs and symptoms of stroke like changes in vision; confusion; trouble speaking or understanding; severe headaches; sudden numbness or weakness of the face, arm, or leg; trouble walking; dizziness; loss of balance or coordination Side effects that usually do not require medical attention (report to your doctor or health care professional if they continue or are bothersome): -constipation -diarrhea -gas This list may not describe all possible side effects. Call your doctor for medical advice about side effects. You may report side effects  to FDA at 1-800-FDA-1088. Where should I keep my medicine? Keep out of the reach of children. Store at room temperature between 15 and 30 degrees C (59 and 86 degrees F). Throw away any unused medicine after the expiration date. NOTE: This sheet is a summary. It may not cover all possible information. If you have questions about this medicine, talk to your doctor, pharmacist, or health care provider.  2019 Elsevier/Gold Standard (2017-12-20 11:22:56)

## 2019-02-13 ENCOUNTER — Other Ambulatory Visit: Payer: Self-pay | Admitting: Internal Medicine

## 2019-02-25 ENCOUNTER — Telehealth: Payer: Self-pay | Admitting: Gastroenterology

## 2019-02-25 NOTE — Telephone Encounter (Signed)
Dr Rush Landmark this pt has a procedure scheduled for 6/29 and will be testing for COVID tomorrow.  She is calling because her daughter was exposed to a COVID 19 positive patient on Friday, Saturday and Sunday of this past week.  Her daughter will be getting tested tomorrow. Do you want her to reschedule her procedure for Monday?

## 2019-02-25 NOTE — Telephone Encounter (Signed)
Pt requested a call back to discuss upcoming procedure and COVID-19 testing.  She stated that her daughter who is living with her has been exposed to Covid.

## 2019-02-25 NOTE — Telephone Encounter (Signed)
If she is OK with waiting for a few weeks just so that there is not the small chance of a false negative the I would reschedule. Please keep me up to date with her decision. Thanks. GM

## 2019-02-25 NOTE — Telephone Encounter (Signed)
The pt has agreed to postpone her EUS to 7/6 and will call us either way with her daughters test result.

## 2019-02-26 ENCOUNTER — Other Ambulatory Visit (HOSPITAL_COMMUNITY): Payer: BLUE CROSS/BLUE SHIELD

## 2019-02-27 ENCOUNTER — Other Ambulatory Visit (HOSPITAL_COMMUNITY): Payer: BLUE CROSS/BLUE SHIELD

## 2019-03-05 ENCOUNTER — Other Ambulatory Visit: Payer: Self-pay

## 2019-03-05 ENCOUNTER — Encounter (HOSPITAL_COMMUNITY): Payer: Self-pay | Admitting: *Deleted

## 2019-03-05 ENCOUNTER — Other Ambulatory Visit (HOSPITAL_COMMUNITY)
Admission: RE | Admit: 2019-03-05 | Discharge: 2019-03-05 | Disposition: A | Discharge status: Home / Self Care | Payer: BC Managed Care – PPO | Source: Ambulatory Visit | Attending: Gastroenterology | Admitting: Gastroenterology

## 2019-03-05 DIAGNOSIS — Z01812 Encounter for preprocedural laboratory examination: Secondary | ICD-10-CM | POA: Insufficient documentation

## 2019-03-05 DIAGNOSIS — Z1159 Encounter for screening for other viral diseases: Secondary | ICD-10-CM | POA: Diagnosis not present

## 2019-03-05 NOTE — Progress Notes (Signed)
Spoke with pt for pre-op call. Pt denies cardiac history or HTN. Pt is a type 2 diabetic. Last A1C was 9.2 in May. States her PCP started her on a fast acting insulin since then and she states her fasting blood sugar is usually between 102-145. Instructed pt to take 1/2 of her regular dose of Toujeo Insulin Sunday PM. She will take 19 units. Instructed her not to take Invokana Sunday or Monday AM. Instructed pt to check her blood sugar Monday AM when she gets up and every 2 hours until she leaves for the hospital. If blood sugar is >220 take 1/2 of usual correction dose of Novolog insulin. If blood sugar is 70 or below, treat with 1/2 cup of clear juice (apple) and recheck blood sugar 15 minutes after drinking juice. If blood sugar continues to be 70 or below, call the Endoscopy department and ask to speak to a nurse. Pt states she was instructed by Dr. Donneta Romberg office to drink clear liquids on Sunday and to take Reglan Sunday PM.   Pt states her daughter was exposed to Covid but has quarantined and has had a negative Covid test. Pt is going for her test today. She voices understanding about needing to quarantine until procedure date.   Coronavirus Screening  Have you experienced the following symptoms:  Cough NO Fever (>100.13F)  NO Runny nose NO Sore throat NO Difficulty breathing/shortness of breath  NO  Have you or a family member traveled in the last 14 days and where? NO   Patient reminded that hospital visitation restrictions are in effect and the importance of the restrictions.

## 2019-03-06 LAB — SARS CORONAVIRUS 2 (TAT 6-24 HRS): SARS Coronavirus 2: NEGATIVE

## 2019-03-09 ENCOUNTER — Ambulatory Visit (HOSPITAL_COMMUNITY)
Admission: RE | Admit: 2019-03-09 | Discharge: 2019-03-09 | Disposition: A | Discharge status: Home / Self Care | Payer: BC Managed Care – PPO | Attending: Gastroenterology | Admitting: Gastroenterology

## 2019-03-09 ENCOUNTER — Ambulatory Visit (HOSPITAL_COMMUNITY): Payer: BC Managed Care – PPO | Admitting: Certified Registered Nurse Anesthetist

## 2019-03-09 ENCOUNTER — Encounter (HOSPITAL_COMMUNITY): Payer: Self-pay | Admitting: *Deleted

## 2019-03-09 ENCOUNTER — Encounter (HOSPITAL_COMMUNITY)
Admission: RE | Disposition: A | Discharge status: Home / Self Care | Payer: Self-pay | Source: Home / Self Care | Attending: Gastroenterology

## 2019-03-09 DIAGNOSIS — K210 Gastro-esophageal reflux disease with esophagitis: Secondary | ICD-10-CM | POA: Insufficient documentation

## 2019-03-09 DIAGNOSIS — F419 Anxiety disorder, unspecified: Secondary | ICD-10-CM | POA: Insufficient documentation

## 2019-03-09 DIAGNOSIS — Z811 Family history of alcohol abuse and dependence: Secondary | ICD-10-CM | POA: Diagnosis not present

## 2019-03-09 DIAGNOSIS — E785 Hyperlipidemia, unspecified: Secondary | ICD-10-CM | POA: Diagnosis not present

## 2019-03-09 DIAGNOSIS — Z6839 Body mass index (BMI) 39.0-39.9, adult: Secondary | ICD-10-CM | POA: Insufficient documentation

## 2019-03-09 DIAGNOSIS — Z888 Allergy status to other drugs, medicaments and biological substances status: Secondary | ICD-10-CM | POA: Diagnosis not present

## 2019-03-09 DIAGNOSIS — K449 Diaphragmatic hernia without obstruction or gangrene: Secondary | ICD-10-CM | POA: Insufficient documentation

## 2019-03-09 DIAGNOSIS — E119 Type 2 diabetes mellitus without complications: Secondary | ICD-10-CM | POA: Insufficient documentation

## 2019-03-09 DIAGNOSIS — D649 Anemia, unspecified: Secondary | ICD-10-CM | POA: Insufficient documentation

## 2019-03-09 DIAGNOSIS — I899 Noninfective disorder of lymphatic vessels and lymph nodes, unspecified: Secondary | ICD-10-CM

## 2019-03-09 DIAGNOSIS — K589 Irritable bowel syndrome without diarrhea: Secondary | ICD-10-CM | POA: Diagnosis not present

## 2019-03-09 DIAGNOSIS — K3189 Other diseases of stomach and duodenum: Secondary | ICD-10-CM | POA: Diagnosis not present

## 2019-03-09 DIAGNOSIS — Z833 Family history of diabetes mellitus: Secondary | ICD-10-CM | POA: Insufficient documentation

## 2019-03-09 DIAGNOSIS — Z836 Family history of other diseases of the respiratory system: Secondary | ICD-10-CM | POA: Diagnosis not present

## 2019-03-09 DIAGNOSIS — Z9842 Cataract extraction status, left eye: Secondary | ICD-10-CM | POA: Diagnosis not present

## 2019-03-09 DIAGNOSIS — Z9841 Cataract extraction status, right eye: Secondary | ICD-10-CM | POA: Diagnosis not present

## 2019-03-09 DIAGNOSIS — Z818 Family history of other mental and behavioral disorders: Secondary | ICD-10-CM | POA: Insufficient documentation

## 2019-03-09 DIAGNOSIS — Z8719 Personal history of other diseases of the digestive system: Secondary | ICD-10-CM | POA: Insufficient documentation

## 2019-03-09 DIAGNOSIS — Z832 Family history of diseases of the blood and blood-forming organs and certain disorders involving the immune mechanism: Secondary | ICD-10-CM | POA: Insufficient documentation

## 2019-03-09 DIAGNOSIS — Z841 Family history of disorders of kidney and ureter: Secondary | ICD-10-CM | POA: Diagnosis not present

## 2019-03-09 DIAGNOSIS — K929 Disease of digestive system, unspecified: Secondary | ICD-10-CM | POA: Diagnosis not present

## 2019-03-09 DIAGNOSIS — K209 Esophagitis, unspecified: Secondary | ICD-10-CM | POA: Diagnosis not present

## 2019-03-09 DIAGNOSIS — D3A8 Other benign neuroendocrine tumors: Secondary | ICD-10-CM | POA: Diagnosis not present

## 2019-03-09 DIAGNOSIS — C7A8 Other malignant neuroendocrine tumors: Secondary | ICD-10-CM

## 2019-03-09 DIAGNOSIS — M1990 Unspecified osteoarthritis, unspecified site: Secondary | ICD-10-CM | POA: Insufficient documentation

## 2019-03-09 DIAGNOSIS — K760 Fatty (change of) liver, not elsewhere classified: Secondary | ICD-10-CM | POA: Diagnosis not present

## 2019-03-09 DIAGNOSIS — F988 Other specified behavioral and emotional disorders with onset usually occurring in childhood and adolescence: Secondary | ICD-10-CM | POA: Insufficient documentation

## 2019-03-09 DIAGNOSIS — Z8249 Family history of ischemic heart disease and other diseases of the circulatory system: Secondary | ICD-10-CM | POA: Insufficient documentation

## 2019-03-09 HISTORY — PX: EUS: SHX5427

## 2019-03-09 HISTORY — DX: Unspecified osteoarthritis, unspecified site: M19.90

## 2019-03-09 HISTORY — PX: ESOPHAGOGASTRODUODENOSCOPY (EGD) WITH PROPOFOL: SHX5813

## 2019-03-09 HISTORY — DX: Fatty (change of) liver, not elsewhere classified: K76.0

## 2019-03-09 HISTORY — PX: BIOPSY: SHX5522

## 2019-03-09 LAB — GLUCOSE, CAPILLARY: Glucose-Capillary: 101 mg/dL — ABNORMAL HIGH (ref 70–99)

## 2019-03-09 SURGERY — ESOPHAGOGASTRODUODENOSCOPY (EGD) WITH PROPOFOL
Anesthesia: Monitor Anesthesia Care

## 2019-03-09 MED ORDER — LIDOCAINE HCL (CARDIAC) PF 100 MG/5ML IV SOSY
PREFILLED_SYRINGE | INTRAVENOUS | Status: DC | PRN
  Administered 2019-03-09: 50 mg via INTRATRACHEAL

## 2019-03-09 MED ORDER — GLYCOPYRROLATE 0.2 MG/ML IJ SOLN
INTRAMUSCULAR | Status: DC | PRN
  Administered 2019-03-09: 0.2 mg via INTRAVENOUS

## 2019-03-09 MED ORDER — PROPOFOL 500 MG/50ML IV EMUL
INTRAVENOUS | Status: DC | PRN
  Administered 2019-03-09: 100 ug/kg/min via INTRAVENOUS

## 2019-03-09 MED ORDER — SODIUM CHLORIDE 0.9 % IV SOLN: INTRAVENOUS | Status: DC

## 2019-03-09 MED ORDER — PROPOFOL 10 MG/ML IV BOLUS
INTRAVENOUS | Status: DC | PRN
  Administered 2019-03-09 (×2): 20 mg via INTRAVENOUS

## 2019-03-09 MED ORDER — LACTATED RINGERS IV SOLN
INTRAVENOUS | Status: DC
  Administered 2019-03-09: 1000 mL via INTRAVENOUS

## 2019-03-09 SURGICAL SUPPLY — 15 items

## 2019-03-09 NOTE — Transfer of Care (Signed)
Immediate Anesthesia Transfer of Care Note  Patient: Paige Hernandez  Procedure(s) Performed: ESOPHAGOGASTRODUODENOSCOPY (EGD) WITH PROPOFOL (N/A ) UPPER ENDOSCOPIC ULTRASOUND (EUS) RADIAL (N/A ) BIOPSY  Patient Location: PACU  Anesthesia Type:MAC  Level of Consciousness: awake, alert  and oriented  Airway & Oxygen Therapy: Patient Spontanous Breathing and Patient connected to nasal cannula oxygen  Post-op Assessment: Report given to RN and Post -op Vital signs reviewed and stable  Post vital signs: Reviewed and stable  Last Vitals:  Vitals Value Taken Time  BP 98/76 03/09/19 1258  Temp    Pulse 85 03/09/19 1259  Resp 13 03/09/19 1259  SpO2 100 % 03/09/19 1259  Vitals shown include unvalidated device data.  Last Pain:  Vitals:   03/09/19 1128  TempSrc: Oral  PainSc: 0-No pain         Complications: No apparent anesthesia complications

## 2019-03-09 NOTE — Anesthesia Preprocedure Evaluation (Signed)
Anesthesia Evaluation  Patient identified by MRN, date of birth, ID band Patient awake    Reviewed: Allergy & Precautions, NPO status , Patient's Chart, lab work & pertinent test results  Airway Mallampati: II  TM Distance: >3 FB Neck ROM: Full    Dental no notable dental hx. (+) Teeth Intact   Pulmonary neg pulmonary ROS,    Pulmonary exam normal breath sounds clear to auscultation       Cardiovascular negative cardio ROS Normal cardiovascular exam Rhythm:Regular Rate:Normal     Neuro/Psych  Headaches, PSYCHIATRIC DISORDERS Anxiety    GI/Hepatic hiatal hernia, GERD  Medicated and Controlled,Neuroendocrine tumor    Endo/Other  diabetes, Well Controlled, Type 2Morbid obesity  Renal/GU   negative genitourinary   Musculoskeletal  (+) Arthritis , Osteoarthritis,    Abdominal (+) + obese,   Peds  Hematology  (+) anemia ,   Anesthesia Other Findings   Reproductive/Obstetrics                             Anesthesia Physical Anesthesia Plan  ASA: III  Anesthesia Plan: MAC   Post-op Pain Management:    Induction: Intravenous  PONV Risk Score and Plan: 2 and Treatment may vary due to age or medical condition and Propofol infusion  Airway Management Planned: Natural Airway and Nasal Cannula  Additional Equipment:   Intra-op Plan:   Post-operative Plan:   Informed Consent: I have reviewed the patients History and Physical, chart, labs and discussed the procedure including the risks, benefits and alternatives for the proposed anesthesia with the patient or authorized representative who has indicated his/her understanding and acceptance.       Plan Discussed with: CRNA  Anesthesia Plan Comments:         Anesthesia Quick Evaluation

## 2019-03-09 NOTE — Op Note (Signed)
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 Estimated Blood Loss:     Estimated blood loss was minimal. Procedure:                Pre-Anesthesia Assessment:                           - Prior to the procedure, a History and Physical                            was performed, and patient medications and                            allergies were reviewed. The patient's tolerance of                            previous anesthesia was also reviewed. The risks                            and benefits of the procedure and the sedation                            options and risks were discussed with the patient.                            All questions were answered, and informed consent                            was obtained. Prior Anticoagulants: The patient has  taken no previous anticoagulant or antiplatelet                            agents except for NSAID medication. ASA Grade                            Assessment: II - A patient with mild systemic                            disease. After reviewing the risks and benefits,                            the patient was deemed in satisfactory  condition to                            undergo the procedure.                           After obtaining informed consent, the endoscope was                            passed under direct vision. Throughout the                            procedure, the patient's blood pressure, pulse, and                            oxygen saturations were monitored continuously. The                            [id number]-[id number] ([id number]) Olympus therapeutic                            gastroscope was introduced through the mouth, and                            advanced to the second part of duodenum. The                            [id number] ([id number]) Olympus Radial EUS scope was                            introduced through the mouth, and advanced to the                            duodenum for ultrasound examination from the                            stomach and duodenum. The upper EUS was                            accomplished without difficulty. The patient  tolerated the procedure. Scope In: Scope Out: Findings:      ENDOSCOPIC FINDING: :      No gross lesions were noted in the proximal esophagus and in the mid       esophagus.      LA Grade B (one or more mucosal breaks greater than 5 mm, not extending       between the tops of two mucosal folds) esophagitis was found 35 cm from       the incisors.      A small hiatal hernia was found. The proximal extent of the gastric       folds (end of tubular esophagus) was 35 cm from the incisors. The hiatal       narrowing was 38 cm from the incisors. The Z-line was 35 cm from the       incisors.      No gross lesions were noted in the entire examined stomach.      A small post mucosectomy scar was found in the duodenal bulb. There was       no evidence of the previous polypoid tissue present. Biopsies were taken       with a cold forceps for histology from the site.      No gross lesions were noted in the D1/D2 angle or second portion  of the       duodenum.      ENDOSONOGRAPHIC FINDING: :      Endosonographic imaging in the duodenal bulb and in the second portion       of the duodenum showed no intramural (subepithelial) lesion.      Endosonographic imaging in the body of the stomach, in the antrum of the       stomach and in the pylorus showed no wall thickening.      No malignant-appearing lymph nodes were visualized in the gastrohepatic       ligament (level 18), celiac region (level 20), perigastric region and       peripancreatic region.      Endosonographic imaging in the visualized portion of the liver showed no       mass.      The celiac region was visualized. Impression:               EGD Impression:                           - No gross lesions in proximal/middle esophagus.                           - LA Grade B esophagitis distally.                           - Small hiatal hernia.                           - No gross lesions in the stomach.                           - Duodenal scar. Biopsied.                           - No gross lesions in the first portion of the  duodenum and in the second portion of the duodenum.                           EUS Impression:                           - No malignant-appearing lymph nodes were                            visualized in the gastrohepatic ligament (level                            18), celiac region (level 20), perigastric region                            and peripancreatic region.                           - No evidence endosconographically of recurrent                            Duodenal NET present. Recommendation:           - The patient will be observed post-procedure,                            until all discharge criteria are met.                           - Discharge patient to home.                           - Patient has a contact number available for                            emergencies. The signs and symptoms of  potential                            delayed complications were discussed with the                            patient. Return to normal activities tomorrow.                            Written discharge instructions were provided to the                            patient.                           - Continue Gaviscon for now.                           - Obtain Chromogranin level in next 1-2 weeks.                           - After  Chromogranin level has been obtained would                            consider restarting a PPI to aid in healing                            esophagitis (even though you are feeling OK and                            have weaned yourself off of H2RAs and PPIs). We                            want to prevent you from developing a stricture in                            the future if possible.                           - Will discuss with Dr. Barry Dienes, follow up as well                            as repeat DOTATATE scan timing.                           - The findings and recommendations were discussed                            with the patient. Procedure Code(s):        --- Professional ---                           [phone], Esophagogastroduodenoscopy, flexible,                            transoral; with endoscopic ultrasound examination                            limited to the esophagus, stomach or duodenum, and                            adjacent structures                           43239, Esophagogastroduodenoscopy, flexible,                            transoral; with biopsy, single or multiple Diagnosis Code(s):        --- Professional ---                           K20.9, Esophagitis, unspecified                           K44.9, Diaphragmatic hernia without obstruction or  gangrene                           K31.89, Other diseases of stomach and duodenum                           I89.9, Noninfective disorder of lymphatic vessels                             and lymph nodes, unspecified                           K92.9, Disease of digestive system, unspecified CPT copyright 2019 American Medical Association. All rights reserved. The codes documented in this report are preliminary and upon coder review may  be revised to meet current compliance requirements. Justice Britain, MD 03/09/2019 1:25:30 PM Number of Addenda: 0
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
## 2019-03-09 NOTE — Anesthesia Postprocedure Evaluation (Signed)
Anesthesia Post Note  Patient: Paige Hernandez  Procedure(s) Performed: ESOPHAGOGASTRODUODENOSCOPY (EGD) WITH PROPOFOL (N/A ) UPPER ENDOSCOPIC ULTRASOUND (EUS) RADIAL (N/A ) BIOPSY     Patient location during evaluation: PACU Anesthesia Type: MAC Level of consciousness: awake and alert Pain management: pain level controlled Vital Signs Assessment: post-procedure vital signs reviewed and stable Respiratory status: spontaneous breathing, nonlabored ventilation, respiratory function stable and patient connected to nasal cannula oxygen Cardiovascular status: stable and blood pressure returned to baseline Postop Assessment: no apparent nausea or vomiting Anesthetic complications: no    Last Vitals:  Vitals:   03/09/19 1310 03/09/19 1315  BP: 103/72 101/82  Pulse: 82 73  Resp: 15 15  Temp:  (!) 36.1 C  SpO2: 100% 98%    Last Pain:  Vitals:   03/09/19 1315  TempSrc:   PainSc: 0-No pain                 Dontaye Hur P [name]

## 2019-03-09 NOTE — Anesthesia Procedure Notes (Signed)
Procedure Name: MAC Date/Time: 03/09/2019 12:02 PM Performed by: Shigeo Baugh T, CRNA Pre-anesthesia Checklist: Patient identified, Emergency Drugs available, Suction available, Patient being monitored and Timeout performed Patient Re-evaluated:Patient Re-evaluated prior to induction Oxygen Delivery Method: Nasal cannula

## 2019-03-10 ENCOUNTER — Encounter (HOSPITAL_COMMUNITY): Payer: Self-pay | Admitting: Gastroenterology

## 2019-03-15 ENCOUNTER — Encounter: Payer: Self-pay | Admitting: Gastroenterology

## 2019-03-16 ENCOUNTER — Other Ambulatory Visit: Payer: Self-pay

## 2019-03-16 DIAGNOSIS — C7A8 Other malignant neuroendocrine tumors: Secondary | ICD-10-CM

## 2019-03-18 ENCOUNTER — Other Ambulatory Visit: Payer: Self-pay | Admitting: Gastroenterology

## 2019-03-18 ENCOUNTER — Other Ambulatory Visit: Payer: BC Managed Care – PPO

## 2019-03-18 DIAGNOSIS — C7A8 Other malignant neuroendocrine tumors: Secondary | ICD-10-CM

## 2019-03-18 DIAGNOSIS — M7542 Impingement syndrome of left shoulder: Secondary | ICD-10-CM | POA: Insufficient documentation

## 2019-03-18 DIAGNOSIS — M25512 Pain in left shoulder: Secondary | ICD-10-CM | POA: Diagnosis not present

## 2019-03-18 DIAGNOSIS — M13812 Other specified arthritis, left shoulder: Secondary | ICD-10-CM | POA: Diagnosis not present

## 2019-03-18 NOTE — Addendum Note (Signed)
Addended by: Boris Lown B on: 03/18/2019 03:56 PM   Modules accepted: Orders

## 2019-03-20 LAB — CHROMOGRANIN A: Chromogranin A (ng/mL): 90.4 ng/mL (ref 0.0–101.8)

## 2019-03-23 ENCOUNTER — Other Ambulatory Visit: Payer: Self-pay

## 2019-03-23 ENCOUNTER — Ambulatory Visit (INDEPENDENT_AMBULATORY_CARE_PROVIDER_SITE_OTHER): Payer: BC Managed Care – PPO | Admitting: Podiatry

## 2019-03-23 VITALS — Temp 97.3°F

## 2019-03-23 DIAGNOSIS — M21611 Bunion of right foot: Secondary | ICD-10-CM

## 2019-03-23 DIAGNOSIS — M21619 Bunion of unspecified foot: Secondary | ICD-10-CM

## 2019-03-23 DIAGNOSIS — M722 Plantar fascial fibromatosis: Secondary | ICD-10-CM | POA: Diagnosis not present

## 2019-03-23 NOTE — Progress Notes (Signed)
Subjective: 63 year old female presents the office today for surgical consultation due to a painful bunion on her right foot.  She states that the injection did help her foot but she still gets discomfort in the bunion site and the bunion has been getting worse over time and she wants to proceed with bunion surgery.  Her A1c when she last had checked was high but she states her daily blood sugars been running much better in the lower 100s.  She is scheduled to get her A1c be checked on Friday.  She is continue with shoe modifications and offloading the bunion without any significant complete resolution. Denies any systemic complaints such as fevers, chills, nausea, vomiting. No acute changes since last appointment, and no other complaints at this time.   Objective: AAO x3, NAD DP/PT pulses palpable bilaterally, CRT less than 3 seconds Significant bunion deformities present on the right foot.  There is hypermobility present of the first ray.  Tenderness along the bunion site.  No pain or crepitation with MPJ range of motion.  There is no other areas of tenderness identified including there is no pain on the course or insertion of plantar fascia.  Plantar fascia, Achilles tendon appears intact.  No area pinpoint tenderness. No open lesions or pre-ulcerative lesions.  No pain with calf compression, swelling, warmth, erythema  Assessment: Right foot pain, bunion deformity  Plan: -All treatment options discussed with the patient including all alternatives, risks, complications.  -Again reviewed the x-rays with her.  Gust with conservative as well as surgical treatment options.  She was proceed with surgical intervention.  We discussed a Lapidus bunionectomy.  She wants to proceed. -The incision placement as well as the postoperative course was discussed with the patient. I discussed risks of the surgery which include, but not limited to, infection, bleeding, pain, swelling, need for further surgery,  delayed or nonhealing, painful or ugly scar, numbness or sensation changes, over/under correction, recurrence, transfer lesions, further deformity, hardware failure, DVT/PE, loss of toe/foot. Patient understands these risks and wishes to proceed with surgery. The surgical consent was reviewed with the patient all 3 pages were signed. No promises or guarantees were given to the outcome of the procedure. All questions were answered to the best of my ability. Before the surgery the patient was encouraged to call the office if there is any further questions. The surgery will be performed at the [facility] on an outpatient basis. -CAM boot dispensed for postop use.  -She needs to have her A1c checked prior to surgery.  It needs to be below 8. -Patient encouraged to call the office with any questions, concerns, change in symptoms.   Trula Slade DPM

## 2019-03-23 NOTE — Patient Instructions (Signed)

## 2019-03-27 ENCOUNTER — Ambulatory Visit (INDEPENDENT_AMBULATORY_CARE_PROVIDER_SITE_OTHER): Payer: BC Managed Care – PPO | Admitting: Internal Medicine

## 2019-03-27 ENCOUNTER — Encounter: Payer: Self-pay | Admitting: Internal Medicine

## 2019-03-27 ENCOUNTER — Other Ambulatory Visit: Payer: Self-pay

## 2019-03-27 VITALS — BP 110/80 | HR 76 | Ht 63.25 in | Wt 230.0 lb

## 2019-03-27 DIAGNOSIS — Z794 Long term (current) use of insulin: Secondary | ICD-10-CM

## 2019-03-27 DIAGNOSIS — E785 Hyperlipidemia, unspecified: Secondary | ICD-10-CM | POA: Diagnosis not present

## 2019-03-27 DIAGNOSIS — E1165 Type 2 diabetes mellitus with hyperglycemia: Secondary | ICD-10-CM | POA: Diagnosis not present

## 2019-03-27 DIAGNOSIS — Z6841 Body Mass Index (BMI) 40.0 and over, adult: Secondary | ICD-10-CM

## 2019-03-27 LAB — POCT GLYCOSYLATED HEMOGLOBIN (HGB A1C): Hemoglobin A1C: 6.6 % — AB (ref 4.0–5.6)

## 2019-03-27 NOTE — Patient Instructions (Addendum)
Please continue: - Trulicity 1.5 mg weekly - Invokana 300 mg in am - Toujeo 38 units at bedtime - Novolog 6-10 units 15 min before main meals   Please come back for a follow-up appointment in 3-4 months.
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
## 2019-03-28 DIAGNOSIS — M25512 Pain in left shoulder: Secondary | ICD-10-CM | POA: Diagnosis not present

## 2019-04-01 DIAGNOSIS — D225 Melanocytic nevi of trunk: Secondary | ICD-10-CM | POA: Diagnosis not present

## 2019-04-01 DIAGNOSIS — L820 Inflamed seborrheic keratosis: Secondary | ICD-10-CM | POA: Diagnosis not present

## 2019-04-01 DIAGNOSIS — L578 Other skin changes due to chronic exposure to nonionizing radiation: Secondary | ICD-10-CM | POA: Diagnosis not present

## 2019-04-01 DIAGNOSIS — D1801 Hemangioma of skin and subcutaneous tissue: Secondary | ICD-10-CM | POA: Diagnosis not present

## 2019-04-01 DIAGNOSIS — L821 Other seborrheic keratosis: Secondary | ICD-10-CM | POA: Diagnosis not present

## 2019-04-02 ENCOUNTER — Telehealth: Payer: Self-pay | Admitting: Internal Medicine

## 2019-04-02 NOTE — Telephone Encounter (Signed)
Okay for pt to get shot

## 2019-04-02 NOTE — Telephone Encounter (Signed)
Pt states she will call back to schedule nurse visit for flu shot

## 2019-04-02 NOTE — Telephone Encounter (Signed)
Patient wants a pneumonia shot.  Not sure of last pneumonia shot date of last injection.

## 2019-04-06 ENCOUNTER — Telehealth: Payer: Self-pay | Admitting: Gastroenterology

## 2019-04-06 DIAGNOSIS — M75122 Complete rotator cuff tear or rupture of left shoulder, not specified as traumatic: Secondary | ICD-10-CM | POA: Diagnosis not present

## 2019-04-06 DIAGNOSIS — M7542 Impingement syndrome of left shoulder: Secondary | ICD-10-CM | POA: Diagnosis not present

## 2019-04-06 NOTE — Telephone Encounter (Signed)
Please see note below.  Update: consent form will be placed on Dr. Donneta Romberg desk for review.  Please update pt with status of consent.

## 2019-04-06 NOTE — Telephone Encounter (Signed)
Pt is to schedule left-shoulder surgery with [facility]--they requested clearance from GI before procedure can be done.  Consent form will be placed on Dr. Woodward Ku desk.  Please update pt with status.

## 2019-04-08 ENCOUNTER — Encounter: Payer: Self-pay | Admitting: Podiatry

## 2019-04-08 NOTE — Progress Notes (Signed)
Member Number: [id number] Policy Effective : 81/09/7508 - 09/02/2198   Name: Latanya Presser Date of Birth:[birth year]   Member Liability Summary      In-Network    Max Per Benefit Period Year-to-Date Remaining  CoInsurance  20%    Deductible  $2500.00 $0.00  Out-Of-Pocket 3  $6000.00 $0.00   [facility] In Network    [employer] Coinsurance Authorization Required   Not Applicable  25% per Service Year  No

## 2019-04-08 NOTE — Telephone Encounter (Signed)
Pt called again to check status of consent form.  She stated that her timing is critical.

## 2019-04-08 NOTE — Telephone Encounter (Signed)
I seen no consent form in Dr. Rush Landmark inbox in his office for him to sign. Nor has he seen it.

## 2019-04-09 ENCOUNTER — Telehealth: Payer: Self-pay | Admitting: Podiatry

## 2019-04-09 NOTE — Telephone Encounter (Signed)
Ro do you have the form ?  I will tell the pt to come pick it up from the front desk.

## 2019-04-09 NOTE — Telephone Encounter (Signed)
I saw form today. I have filled it out. I have written from a GI perspective that I do not see issues but medical optimization/clearance should be done through PCP. Thanks. GM

## 2019-04-09 NOTE — Telephone Encounter (Signed)
I'm calling you back with an answer in regards to my surgery date.

## 2019-04-09 NOTE — Telephone Encounter (Signed)
Pt called to say that Dr. Hart Robinsons at [facility] wants to do her shoulder surgery prior to her having her foot surgery. Pt tentatively rescheduled her surgery from 12 August to 26 August, but kept the scheduled surgery on 12 August as well. Stated she was going to call Dr. Theda Sers back today to say she wants her foot surgery first and will call me back to let me know what is decided.

## 2019-04-09 NOTE — Telephone Encounter (Signed)
I have not seen the form.  Will send back to Hoy Register so that she can check on the form.

## 2019-04-09 NOTE — Telephone Encounter (Signed)
I called pt back and she requested to have her surgery date rescheduled to Wednesday 26 August. I told her I would contact the surgical center and also reschedule her postop visits.

## 2019-04-09 NOTE — Telephone Encounter (Signed)
Please see note below. 

## 2019-04-09 NOTE — Telephone Encounter (Signed)
Lillie Columbia faxed it this morning to [facility].

## 2019-04-09 NOTE — Telephone Encounter (Signed)
The pt has been advised that the form was faxed to the ortho office

## 2019-04-20 ENCOUNTER — Other Ambulatory Visit: Payer: BC Managed Care – PPO

## 2019-04-21 DIAGNOSIS — X58XXXA Exposure to other specified factors, initial encounter: Secondary | ICD-10-CM | POA: Diagnosis not present

## 2019-04-21 DIAGNOSIS — S46192A Other injury of muscle, fascia and tendon of long head of biceps, left arm, initial encounter: Secondary | ICD-10-CM | POA: Diagnosis not present

## 2019-04-21 DIAGNOSIS — M7522 Bicipital tendinitis, left shoulder: Secondary | ICD-10-CM | POA: Diagnosis not present

## 2019-04-21 DIAGNOSIS — Y999 Unspecified external cause status: Secondary | ICD-10-CM | POA: Diagnosis not present

## 2019-04-21 DIAGNOSIS — S43492A Other sprain of left shoulder joint, initial encounter: Secondary | ICD-10-CM | POA: Diagnosis not present

## 2019-04-21 DIAGNOSIS — S43432A Superior glenoid labrum lesion of left shoulder, initial encounter: Secondary | ICD-10-CM | POA: Diagnosis not present

## 2019-04-21 DIAGNOSIS — G8918 Other acute postprocedural pain: Secondary | ICD-10-CM | POA: Diagnosis not present

## 2019-04-21 DIAGNOSIS — M7542 Impingement syndrome of left shoulder: Secondary | ICD-10-CM | POA: Diagnosis not present

## 2019-04-21 DIAGNOSIS — S46012A Strain of muscle(s) and tendon(s) of the rotator cuff of left shoulder, initial encounter: Secondary | ICD-10-CM | POA: Diagnosis not present

## 2019-04-21 DIAGNOSIS — M19012 Primary osteoarthritis, left shoulder: Secondary | ICD-10-CM | POA: Diagnosis not present

## 2019-04-21 HISTORY — PX: ROTATOR CUFF REPAIR: SHX139

## 2019-04-27 DIAGNOSIS — M25512 Pain in left shoulder: Secondary | ICD-10-CM | POA: Diagnosis not present

## 2019-04-27 DIAGNOSIS — M25612 Stiffness of left shoulder, not elsewhere classified: Secondary | ICD-10-CM | POA: Diagnosis not present

## 2019-04-28 ENCOUNTER — Telehealth: Payer: Self-pay | Admitting: *Deleted

## 2019-04-28 ENCOUNTER — Other Ambulatory Visit: Payer: Self-pay | Admitting: Internal Medicine

## 2019-04-28 DIAGNOSIS — E113293 Type 2 diabetes mellitus with mild nonproliferative diabetic retinopathy without macular edema, bilateral: Secondary | ICD-10-CM | POA: Diagnosis not present

## 2019-04-28 NOTE — Telephone Encounter (Signed)
"  Mrs. Shean has canceled for tomorrow.  She recently had shoulder surgery and her doctor has recommended that she not have the foot surgery yet because trying to use the scooter will put too much stress on her shoulder.  She wants you to call her to reschedule it to sometime in November or she'll call you when it gets closer to November to schedule it."  I'll give her a call.  I attempted to call the patient to reschedule her surgery.  I left her a message to call me back.

## 2019-04-30 NOTE — Telephone Encounter (Addendum)
I am calling you in regards to your surgery.  We received a call from the scheduler at the surgical center.  She said you wanted to cancel your surgery.  "Yes, I do.  I just had shoulder surgery.  I don't think it's a good idea for me to have the foot surgery right now.  I don't want to mess anything up.  I won't be able to use the knee scooter due to my shoulders.  I go on Medicare in November, so I think I will just wait until the Winter or the Fall.  I will call you back in a month.  I want to see how things go with my shoulders."  That is fine.  Dr. Jacqualyn Posey has time available on any Wednesday in November at this time.  I canceled the surgery that's scheduled for 04/29/2019 in [facility]

## 2019-05-01 ENCOUNTER — Encounter

## 2019-05-01 ENCOUNTER — Encounter: Payer: Self-pay | Admitting: Gastroenterology

## 2019-05-01 ENCOUNTER — Ambulatory Visit (INDEPENDENT_AMBULATORY_CARE_PROVIDER_SITE_OTHER): Payer: BC Managed Care – PPO | Admitting: Gastroenterology

## 2019-05-01 VITALS — BP 90/68 | HR 69 | Temp 98.2°F | Ht 63.5 in | Wt 229.2 lb

## 2019-05-01 DIAGNOSIS — K648 Other hemorrhoids: Secondary | ICD-10-CM | POA: Diagnosis not present

## 2019-05-01 DIAGNOSIS — K5909 Other constipation: Secondary | ICD-10-CM

## 2019-05-01 DIAGNOSIS — K582 Mixed irritable bowel syndrome: Secondary | ICD-10-CM

## 2019-05-01 DIAGNOSIS — D3A8 Other benign neuroendocrine tumors: Secondary | ICD-10-CM | POA: Diagnosis not present

## 2019-05-01 DIAGNOSIS — K219 Gastro-esophageal reflux disease without esophagitis: Secondary | ICD-10-CM | POA: Diagnosis not present

## 2019-05-01 DIAGNOSIS — D3A010 Benign carcinoid tumor of the duodenum: Secondary | ICD-10-CM

## 2019-05-01 MED ORDER — HYDROCORTISONE ACETATE 25 MG RE SUPP: 25.0000 mg | Freq: Every evening | RECTAL | 1 refills | Status: DC | PRN

## 2019-05-01 MED ORDER — ESOMEPRAZOLE MAGNESIUM 20 MG PO CPDR: 20.0000 mg | DELAYED_RELEASE_CAPSULE | Freq: Every day | ORAL | 6 refills | Status: DC

## 2019-05-01 NOTE — Patient Instructions (Signed)
We have sent Nexium to your pharmacy  We have sent Anusol to your pharmacy  Take colace 1 capsule daily  Use Miralax 1 capful daily as needed  Follow up in 3 months  I appreciate the  opportunity to care for you  Thank You   Harl Bowie , MD
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
## 2019-05-04 ENCOUNTER — Other Ambulatory Visit: Payer: BC Managed Care – PPO

## 2019-05-04 ENCOUNTER — Other Ambulatory Visit: Payer: Self-pay | Admitting: Internal Medicine

## 2019-05-04 ENCOUNTER — Encounter: Payer: Self-pay | Admitting: Internal Medicine

## 2019-05-04 DIAGNOSIS — Z01419 Encounter for gynecological examination (general) (routine) without abnormal findings: Secondary | ICD-10-CM | POA: Diagnosis not present

## 2019-05-04 DIAGNOSIS — Z6841 Body Mass Index (BMI) 40.0 and over, adult: Secondary | ICD-10-CM | POA: Diagnosis not present

## 2019-05-05 DIAGNOSIS — M25612 Stiffness of left shoulder, not elsewhere classified: Secondary | ICD-10-CM | POA: Diagnosis not present

## 2019-05-05 DIAGNOSIS — M25512 Pain in left shoulder: Secondary | ICD-10-CM | POA: Diagnosis not present

## 2019-05-08 DIAGNOSIS — Z4789 Encounter for other orthopedic aftercare: Secondary | ICD-10-CM | POA: Diagnosis not present

## 2019-05-12 DIAGNOSIS — M25512 Pain in left shoulder: Secondary | ICD-10-CM | POA: Diagnosis not present

## 2019-05-12 DIAGNOSIS — M25612 Stiffness of left shoulder, not elsewhere classified: Secondary | ICD-10-CM | POA: Diagnosis not present

## 2019-05-14 ENCOUNTER — Ambulatory Visit: Payer: BLUE CROSS/BLUE SHIELD | Admitting: Internal Medicine

## 2019-05-14 ENCOUNTER — Encounter

## 2019-05-20 DIAGNOSIS — M25512 Pain in left shoulder: Secondary | ICD-10-CM | POA: Diagnosis not present

## 2019-05-20 DIAGNOSIS — M25612 Stiffness of left shoulder, not elsewhere classified: Secondary | ICD-10-CM | POA: Diagnosis not present

## 2019-05-26 DIAGNOSIS — M25612 Stiffness of left shoulder, not elsewhere classified: Secondary | ICD-10-CM | POA: Diagnosis not present

## 2019-05-26 DIAGNOSIS — M25512 Pain in left shoulder: Secondary | ICD-10-CM | POA: Diagnosis not present

## 2019-06-02 DIAGNOSIS — M25512 Pain in left shoulder: Secondary | ICD-10-CM | POA: Diagnosis not present

## 2019-06-02 DIAGNOSIS — M25612 Stiffness of left shoulder, not elsewhere classified: Secondary | ICD-10-CM | POA: Diagnosis not present

## 2019-06-10 DIAGNOSIS — M25612 Stiffness of left shoulder, not elsewhere classified: Secondary | ICD-10-CM | POA: Diagnosis not present

## 2019-06-10 DIAGNOSIS — M25512 Pain in left shoulder: Secondary | ICD-10-CM | POA: Diagnosis not present

## 2019-06-12 DIAGNOSIS — M25612 Stiffness of left shoulder, not elsewhere classified: Secondary | ICD-10-CM | POA: Diagnosis not present

## 2019-06-12 DIAGNOSIS — M25512 Pain in left shoulder: Secondary | ICD-10-CM | POA: Diagnosis not present

## 2019-06-16 DIAGNOSIS — M25512 Pain in left shoulder: Secondary | ICD-10-CM | POA: Diagnosis not present

## 2019-06-16 DIAGNOSIS — M25612 Stiffness of left shoulder, not elsewhere classified: Secondary | ICD-10-CM | POA: Diagnosis not present

## 2019-06-18 DIAGNOSIS — M25512 Pain in left shoulder: Secondary | ICD-10-CM | POA: Diagnosis not present

## 2019-06-18 DIAGNOSIS — M25612 Stiffness of left shoulder, not elsewhere classified: Secondary | ICD-10-CM | POA: Diagnosis not present

## 2019-06-20 ENCOUNTER — Other Ambulatory Visit: Payer: Self-pay

## 2019-06-20 ENCOUNTER — Ambulatory Visit (INDEPENDENT_AMBULATORY_CARE_PROVIDER_SITE_OTHER): Payer: BC Managed Care – PPO

## 2019-06-20 DIAGNOSIS — Z23 Encounter for immunization: Secondary | ICD-10-CM

## 2019-06-22 ENCOUNTER — Other Ambulatory Visit: Payer: Self-pay

## 2019-06-22 ENCOUNTER — Ambulatory Visit (INDEPENDENT_AMBULATORY_CARE_PROVIDER_SITE_OTHER): Payer: BC Managed Care – PPO

## 2019-06-22 DIAGNOSIS — Z23 Encounter for immunization: Secondary | ICD-10-CM

## 2019-06-22 NOTE — Progress Notes (Signed)
Per orders of Dr.Panosh , injection of Prevnar13 given by Franco Collet. Patient tolerated injection well.

## 2019-06-24 DIAGNOSIS — M25612 Stiffness of left shoulder, not elsewhere classified: Secondary | ICD-10-CM | POA: Diagnosis not present

## 2019-06-24 DIAGNOSIS — M25512 Pain in left shoulder: Secondary | ICD-10-CM | POA: Diagnosis not present

## 2019-06-26 DIAGNOSIS — M25612 Stiffness of left shoulder, not elsewhere classified: Secondary | ICD-10-CM | POA: Diagnosis not present

## 2019-06-26 DIAGNOSIS — M25512 Pain in left shoulder: Secondary | ICD-10-CM | POA: Diagnosis not present

## 2019-07-01 DIAGNOSIS — M25512 Pain in left shoulder: Secondary | ICD-10-CM | POA: Diagnosis not present

## 2019-07-01 DIAGNOSIS — M25612 Stiffness of left shoulder, not elsewhere classified: Secondary | ICD-10-CM | POA: Diagnosis not present

## 2019-07-03 DIAGNOSIS — M25512 Pain in left shoulder: Secondary | ICD-10-CM | POA: Diagnosis not present

## 2019-07-03 DIAGNOSIS — M25612 Stiffness of left shoulder, not elsewhere classified: Secondary | ICD-10-CM | POA: Diagnosis not present

## 2019-07-07 DIAGNOSIS — M25612 Stiffness of left shoulder, not elsewhere classified: Secondary | ICD-10-CM | POA: Diagnosis not present

## 2019-07-07 DIAGNOSIS — M25512 Pain in left shoulder: Secondary | ICD-10-CM | POA: Diagnosis not present

## 2019-07-09 DIAGNOSIS — M25612 Stiffness of left shoulder, not elsewhere classified: Secondary | ICD-10-CM | POA: Diagnosis not present

## 2019-07-09 DIAGNOSIS — M25512 Pain in left shoulder: Secondary | ICD-10-CM | POA: Diagnosis not present

## 2019-07-24 ENCOUNTER — Other Ambulatory Visit: Payer: Self-pay

## 2019-07-28 ENCOUNTER — Ambulatory Visit (INDEPENDENT_AMBULATORY_CARE_PROVIDER_SITE_OTHER): Payer: Medicare Other | Admitting: Internal Medicine

## 2019-07-28 ENCOUNTER — Encounter: Payer: Self-pay | Admitting: Internal Medicine

## 2019-07-28 ENCOUNTER — Ambulatory Visit (HOSPITAL_COMMUNITY)
Admission: EM | Admit: 2019-07-28 | Discharge: 2019-07-28 | Disposition: A | Discharge status: Home / Self Care | Payer: Medicare Other | Attending: Family Medicine | Admitting: Family Medicine

## 2019-07-28 ENCOUNTER — Encounter (HOSPITAL_COMMUNITY): Payer: Self-pay

## 2019-07-28 ENCOUNTER — Other Ambulatory Visit: Payer: Self-pay

## 2019-07-28 ENCOUNTER — Ambulatory Visit: Payer: Self-pay

## 2019-07-28 ENCOUNTER — Ambulatory Visit (INDEPENDENT_AMBULATORY_CARE_PROVIDER_SITE_OTHER): Payer: Medicare Other

## 2019-07-28 VITALS — BP 120/60 | HR 85 | Ht 63.5 in | Wt 227.0 lb

## 2019-07-28 DIAGNOSIS — Z6841 Body Mass Index (BMI) 40.0 and over, adult: Secondary | ICD-10-CM

## 2019-07-28 DIAGNOSIS — E1165 Type 2 diabetes mellitus with hyperglycemia: Secondary | ICD-10-CM | POA: Diagnosis not present

## 2019-07-28 DIAGNOSIS — S0083XA Contusion of other part of head, initial encounter: Secondary | ICD-10-CM | POA: Diagnosis not present

## 2019-07-28 DIAGNOSIS — Z794 Long term (current) use of insulin: Secondary | ICD-10-CM | POA: Diagnosis not present

## 2019-07-28 DIAGNOSIS — E785 Hyperlipidemia, unspecified: Secondary | ICD-10-CM | POA: Diagnosis not present

## 2019-07-28 LAB — LIPID PANEL
Cholesterol: 187 mg/dL (ref 0–200)
HDL: 58.9 mg/dL (ref >39.00)
LDL Cholesterol: 108 mg/dL — ABNORMAL HIGH (ref 0–99)
NonHDL: 127.69
Total CHOL/HDL Ratio: 3
Triglycerides: 100 mg/dL (ref 0.0–149.0)
VLDL: 20 mg/dL (ref 0.0–40.0)

## 2019-07-28 LAB — POCT GLYCOSYLATED HEMOGLOBIN (HGB A1C): Hemoglobin A1C: 6.4 % — AB (ref 4.0–5.6)

## 2019-07-28 LAB — MICROALBUMIN / CREATININE URINE RATIO
Creatinine,U: 228.9 mg/dL
Microalb Creat Ratio: 0.7 mg/g (ref 0.0–30.0)
Microalb, Ur: 1.7 mg/dL (ref 0.0–1.9)

## 2019-07-28 MED ORDER — TRULICITY 3 MG/0.5ML ~~LOC~~ SOAJ: 3.0000 mg | SUBCUTANEOUS | 3 refills | Status: DC
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
## 2019-07-28 NOTE — Telephone Encounter (Signed)
I agree

## 2019-07-28 NOTE — Discharge Instructions (Addendum)
Ice Rest Tylenol for pain Return as needed Pleasure meeting you!!
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
## 2019-07-28 NOTE — Patient Instructions (Addendum)
Please continue: - Invokana 300 mg in am - Toujeo 38 units at bedtime - NovoLog 6-10 units 15 min before main meals  Please increase: - Trulicity to 3 mg weekly  Please come back for a follow-up appointment in 3-4 months.

## 2019-07-28 NOTE — ED Triage Notes (Signed)
Pt states she fell in a hole and face hit the ground.  Pt states she broke her glasses and face is swollen and burning. She heard a crack when her face hit the ground. This happened around 3:30 pm today.

## 2019-07-28 NOTE — Telephone Encounter (Signed)
Incoming call from Patient with a complaint of falling flat on her face.  Patient thinks she has a broken cheek bone.  Right knee bone injured along with right hand bone.  Reports bruising and swelling. Reviewed protocol , recommended Patient go to Urgent Care or ED Pt chose Urgent care            Reason for Disposition . Closing the mouth and biting down does not feel normal  Answer Assessment - Initial Assessment Questions 1. MECHANISM: "How did the injury happen?"      Fell on face 2. ONSET: "When did the injury happen?" (Minutes or hours ago)     40 minutes ago 3. LOCATION: "What part of the face is injured?"    Cheek  bone  4. APPEARANCE of INJURY: "What does the face look like?"     Swollen  5. BLEEDING: "Is it bleeding now?" If so, ask, "Is it difficult to stop?"     *No Answer* 6. PAIN: "Is there pain?" If so, ask: "How bad is the pain?"  (e.g., Scale 1-10; or mild, moderate, severe)     *No Answer* 7. SIZE: For cuts, bruises, or swelling, ask: "How large is it?" (e.g., inches or centimeters)      Bruising.   8. TETANUS: For any breaks in the skin, ask: "When was the last tetanus booster?"     unknown 9. OTHER SYMPTOMS: "Do you have any other symptoms?" (e.g., neck pain, headache, loss of consciousness)     denies 10. PREGNANCY: "Is there any chance you are pregnant?" "When was your last menstrual period?"       *No Answer*  Protocols used: FACE INJURY-A-AH

## 2019-07-28 NOTE — Telephone Encounter (Signed)
fyi pt is going to urgent care

## 2019-07-29 LAB — COMPLETE METABOLIC PANEL WITH GFR
AG Ratio: 1.6 (calc) (ref 1.0–2.5)
ALT: 23 U/L (ref 6–29)
AST: 23 U/L (ref 10–35)
Albumin: 3.9 g/dL (ref 3.6–5.1)
Alkaline phosphatase (APISO): 78 U/L (ref 37–153)
BUN: 14 mg/dL (ref 7–25)
CO2: 27 mmol/L (ref 20–32)
Calcium: 9.5 mg/dL (ref 8.6–10.4)
Chloride: 108 mmol/L (ref 98–110)
Creat: 0.89 mg/dL (ref 0.50–0.99)
GFR, Est African American: 79 mL/min/{1.73_m2} (ref >=60)
GFR, Est Non African American: 68 mL/min/{1.73_m2} (ref >=60)
Globulin: 2.5 g/dL (calc) (ref 1.9–3.7)
Glucose, Bld: 138 mg/dL — ABNORMAL HIGH (ref 65–99)
Potassium: 4.8 mmol/L (ref 3.5–5.3)
Sodium: 142 mmol/L (ref 135–146)
Total Bilirubin: 0.5 mg/dL (ref 0.2–1.2)
Total Protein: 6.4 g/dL (ref 6.1–8.1)

## 2019-08-25 ENCOUNTER — Other Ambulatory Visit: Payer: Self-pay | Admitting: Internal Medicine

## 2019-08-27 ENCOUNTER — Ambulatory Visit: Payer: Self-pay | Admitting: *Deleted

## 2019-08-27 ENCOUNTER — Ambulatory Visit: Payer: Medicare Other | Attending: Internal Medicine

## 2019-08-27 DIAGNOSIS — Z20822 Contact with and (suspected) exposure to covid-19: Secondary | ICD-10-CM

## 2019-08-27 NOTE — Telephone Encounter (Signed)
Fyi.

## 2019-08-27 NOTE — Telephone Encounter (Signed)
Per initial encounter, "Pt stated that her son in law tested positive for covid 19 on 08/25/19 with a spit test at [employer] (his job) and a PCR test at [employer] and both were positive. He is no longer having symptoms. She wants to know if she and her family that have been exposed to pt need to get tested and what kind of test they should get."; symptoms reviewed; advised pt to monitor for symptoms, and it could take up to 14 days for symptoms to evolve; quarantine information reviewed; pt also informed there are community testing sites that do not require a MDs order, but an appointment is needed; she verbalized understanding; the pt is seen by Dr Regis Bill, [facility]; will route to office for notification.   Reason for Disposition . General information question, no triage required and triager able to answer question  Answer Assessment - Initial Assessment Questions 1. REASON FOR CALL or QUESTION: "What is your reason for calling today?" or "How can I best help you?" or "What question do you have that I can help answer?"     COVID exposure  Protocols used: [place]

## 2019-08-27 NOTE — Telephone Encounter (Signed)
Advise quarantine  From  Person positive and close contacts  And  If no sx  After 10 days ok  New CDC  guidelines say can  Quarantine for 7 days if no symptoms and have a negative covid test  At about 5 days or later after exposure .

## 2019-08-28 LAB — NOVEL CORONAVIRUS, NAA: SARS-CoV-2, NAA: NOT DETECTED

## 2019-09-02 ENCOUNTER — Ambulatory Visit: Payer: Medicare Other | Attending: Internal Medicine

## 2019-09-02 DIAGNOSIS — U071 COVID-19: Secondary | ICD-10-CM

## 2019-09-02 DIAGNOSIS — R238 Other skin changes: Secondary | ICD-10-CM

## 2019-09-03 LAB — NOVEL CORONAVIRUS, NAA: SARS-CoV-2, NAA: DETECTED — AB

## 2019-09-07 ENCOUNTER — Telehealth: Payer: Self-pay | Admitting: Internal Medicine

## 2019-09-07 NOTE — Telephone Encounter (Signed)
Patient returned call and she was informed that her test for COVID-19 09/02/2019 was positive and she can pass the germ to others. She states she read her result in My Chart.  She has congestion, cough, body aches. She is diabetic and is monitoring her blood sugar daily. It has been 150. Symptom tier and treatment recommendations with criteria for ending isolation were read to patient. Good preventative practices were discussed. Patient verbalized understanding and agrees to contact Dr Regis Bill if BS elevate or breathing issues develope. Patient verbalized understanding of all information. HD has been notified per previous documentation.

## 2019-09-08 ENCOUNTER — Telehealth: Payer: Medicare Other | Admitting: Internal Medicine

## 2019-09-08 ENCOUNTER — Telehealth (INDEPENDENT_AMBULATORY_CARE_PROVIDER_SITE_OTHER): Payer: Medicare Other | Admitting: Internal Medicine

## 2019-09-08 ENCOUNTER — Other Ambulatory Visit: Payer: Self-pay

## 2019-09-08 ENCOUNTER — Encounter: Payer: Self-pay | Admitting: Internal Medicine

## 2019-09-08 DIAGNOSIS — U071 COVID-19: Secondary | ICD-10-CM | POA: Diagnosis not present

## 2019-09-08 DIAGNOSIS — Z79899 Other long term (current) drug therapy: Secondary | ICD-10-CM | POA: Diagnosis not present

## 2019-09-08 DIAGNOSIS — E1165 Type 2 diabetes mellitus with hyperglycemia: Secondary | ICD-10-CM

## 2019-09-08 DIAGNOSIS — Z6841 Body Mass Index (BMI) 40.0 and over, adult: Secondary | ICD-10-CM

## 2019-09-08 DIAGNOSIS — Z794 Long term (current) use of insulin: Secondary | ICD-10-CM

## 2019-09-08 NOTE — Telephone Encounter (Signed)
but we can do a virutal visit to  Assess
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
## 2019-09-08 NOTE — Telephone Encounter (Signed)
Pt has been made appt  

## 2019-09-18 ENCOUNTER — Other Ambulatory Visit (HOSPITAL_COMMUNITY): Payer: Self-pay | Admitting: General Surgery

## 2019-09-18 DIAGNOSIS — C7A8 Other malignant neuroendocrine tumors: Secondary | ICD-10-CM

## 2019-09-29 ENCOUNTER — Telehealth: Payer: Self-pay | Admitting: Internal Medicine

## 2019-09-29 ENCOUNTER — Other Ambulatory Visit: Payer: Self-pay

## 2019-09-29 ENCOUNTER — Ambulatory Visit (HOSPITAL_COMMUNITY)
Admission: RE | Admit: 2019-09-29 | Discharge: 2019-09-29 | Disposition: A | Discharge status: Home / Self Care | Payer: Medicare Other | Source: Ambulatory Visit | Attending: General Surgery | Admitting: General Surgery

## 2019-09-29 DIAGNOSIS — C7A8 Other malignant neuroendocrine tumors: Secondary | ICD-10-CM | POA: Insufficient documentation

## 2019-09-29 MED ORDER — GALLIUM GA 68 DOTATATE IV KIT
4.8000 | PACK | Freq: Once | INTRAVENOUS | Status: AC | PRN
  Administered 2019-09-29: 4.8 via INTRAVENOUS

## 2019-09-29 NOTE — Telephone Encounter (Signed)
Stacey from Radiology called in to report a call report on Paige Hernandez. Erline Levine needs a call back either from [place] or [place] at [phone]

## 2019-09-29 NOTE — Telephone Encounter (Signed)
Radiology wants the NMPET imaging of skull to be looked at its in the chart. Please advise

## 2019-09-29 NOTE — Telephone Encounter (Signed)
Her lung exam shows evidence of covid infection. But if doing ok can observe  And get back with [place] if  Residual shortness of breath  Cough etc,

## 2019-09-29 NOTE — Telephone Encounter (Signed)
Looks ok except  Evidence of her covid infection .    Please send her message  And hnahve her update how she is doing.

## 2019-09-29 NOTE — Telephone Encounter (Signed)
Pt has been told this from her other Doctor

## 2019-10-17 ENCOUNTER — Ambulatory Visit: Payer: Medicare Other | Attending: Internal Medicine

## 2019-10-17 DIAGNOSIS — Z23 Encounter for immunization: Secondary | ICD-10-CM

## 2019-10-17 NOTE — Progress Notes (Signed)
   Covid-19 Vaccination Clinic  Name:  CODA MISKO    MRN: [id number] DOB: [birth year]  10/17/2019  Ms. Lemelle was observed post Covid-19 immunization for 15 minutes without incidence. She was provided with Vaccine Information Sheet and instruction to access the V-Safe system.   Ms. Sakamoto was instructed to call 911 with any severe reactions post vaccine: Marland Kitchen Difficulty breathing  . Swelling of your face and throat  . A fast heartbeat  . A bad rash all over your body  . Dizziness and weakness    Immunizations Administered    Name Date Dose VIS Date Route   Pfizer COVID-19 Vaccine 10/17/2019  9:03 AM 0.3 mL 08/14/2019 Intramuscular   Manufacturer: [place]   Lot: [id number]   [place]: [id number]

## 2019-11-08 ENCOUNTER — Ambulatory Visit: Payer: Medicare Other | Attending: Internal Medicine

## 2019-11-08 DIAGNOSIS — Z23 Encounter for immunization: Secondary | ICD-10-CM

## 2019-11-08 NOTE — Progress Notes (Signed)
   Covid-19 Vaccination Clinic  Name:  ARTEMIS ESSENBURG    MRN: [id number] DOB: October 21, 1955  11/08/2019  Ms. Teel was observed post Covid-19 immunization for 15 minutes without incident. She was provided with Vaccine Information Sheet and instruction to access the V-Safe system.   Ms. Enright was instructed to call 911 with any severe reactions post vaccine: Marland Kitchen Difficulty breathing  . Swelling of face and throat  . A fast heartbeat  . A bad rash all over body  . Dizziness and weakness   Immunizations Administered    Name Date Dose VIS Date Route   Pfizer COVID-19 Vaccine 11/08/2019  1:02 AM 0.3 mL 08/14/2019 Intramuscular   Manufacturer: [place]   Lot: [id number]   [place]: [id number]

## 2019-11-24 ENCOUNTER — Other Ambulatory Visit: Payer: Self-pay

## 2019-11-26 ENCOUNTER — Ambulatory Visit (INDEPENDENT_AMBULATORY_CARE_PROVIDER_SITE_OTHER): Payer: Medicare Other | Admitting: Internal Medicine

## 2019-11-26 ENCOUNTER — Other Ambulatory Visit: Payer: Self-pay

## 2019-11-26 ENCOUNTER — Encounter: Payer: Self-pay | Admitting: Internal Medicine

## 2019-11-26 VITALS — BP 126/82 | HR 102 | Ht 63.5 in | Wt 219.0 lb

## 2019-11-26 DIAGNOSIS — E1165 Type 2 diabetes mellitus with hyperglycemia: Secondary | ICD-10-CM | POA: Diagnosis not present

## 2019-11-26 DIAGNOSIS — E785 Hyperlipidemia, unspecified: Secondary | ICD-10-CM

## 2019-11-26 DIAGNOSIS — Z794 Long term (current) use of insulin: Secondary | ICD-10-CM | POA: Diagnosis not present

## 2019-11-26 DIAGNOSIS — Z6841 Body Mass Index (BMI) 40.0 and over, adult: Secondary | ICD-10-CM

## 2019-11-26 LAB — POCT GLYCOSYLATED HEMOGLOBIN (HGB A1C): Hemoglobin A1C: 8.7 % — AB (ref 4.0–5.6)

## 2019-11-26 NOTE — Patient Instructions (Addendum)
Please start taking consistently: - Invokana 300 mg in am - Toujeo 38 units at bedtime - NovoLog 6-10 units 15 min before main meals - Trulicity 3 mg weekly  Please use Benadryl spray at the time of Trulicity injection.  Please come back for a follow-up appointment in 4 months.
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
## 2019-11-26 NOTE — Addendum Note (Signed)
Addended by: Cardell Peach I on: 11/26/2019 11:40 AM   Modules accepted: Orders

## 2019-12-17 ENCOUNTER — Encounter: Payer: Self-pay | Admitting: *Deleted

## 2019-12-21 ENCOUNTER — Ambulatory Visit (INDEPENDENT_AMBULATORY_CARE_PROVIDER_SITE_OTHER): Payer: Medicare Other | Admitting: Diagnostic Neuroimaging

## 2019-12-21 ENCOUNTER — Encounter: Payer: Self-pay | Admitting: Diagnostic Neuroimaging

## 2019-12-21 ENCOUNTER — Other Ambulatory Visit: Payer: Self-pay

## 2019-12-21 VITALS — BP 122/86 | HR 72 | Temp 96.8°F | Ht 63.5 in | Wt 221.4 lb

## 2019-12-21 DIAGNOSIS — R200 Anesthesia of skin: Secondary | ICD-10-CM | POA: Diagnosis not present
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
## 2019-12-21 NOTE — Patient Instructions (Signed)
-  check EMG (nerve testing)

## 2020-01-21 ENCOUNTER — Other Ambulatory Visit: Payer: Self-pay

## 2020-01-21 ENCOUNTER — Encounter (INDEPENDENT_AMBULATORY_CARE_PROVIDER_SITE_OTHER): Payer: Medicare Other | Admitting: Diagnostic Neuroimaging

## 2020-01-21 ENCOUNTER — Ambulatory Visit (INDEPENDENT_AMBULATORY_CARE_PROVIDER_SITE_OTHER): Payer: Medicare Other | Admitting: Diagnostic Neuroimaging

## 2020-01-21 DIAGNOSIS — Z0289 Encounter for other administrative examinations: Secondary | ICD-10-CM

## 2020-01-21 DIAGNOSIS — R200 Anesthesia of skin: Secondary | ICD-10-CM | POA: Diagnosis not present

## 2020-02-04 NOTE — Procedures (Signed)
[facility]  NCS (NERVE CONDUCTION STUDY) WITH EMG (ELECTROMYOGRAPHY) REPORT   STUDY DATE: 01/21/20 PATIENT NAME: Paige Hernandez DOB: 1956/02/23 MRN: [id number]  ORDERING CLINICIAN: Andrey Spearman, MD   TECHNOLOGIST: Sherre Scarlet ELECTROMYOGRAPHER: Earlean Polka. Nikolina Simerson, MD  CLINICAL INFORMATION: 64 year old female with bilateral hand numbness.  FINDINGS: NERVE CONDUCTION STUDY:  Bilateral median and ulnar motor responses are normal.  Bilateral median and ulnar sensory responses are normal.  Bilateral median to ulnar transcarpal mixed nerve comparison studies show slightly prolonged peak latency differences bilaterally.   NEEDLE ELECTROMYOGRAPHY:  Needle examination of right upper extremity is normal.   IMPRESSION:   This study demonstrates: - Borderline bilateral median neuropathies at the wrists consistent with borderline mild bilateral carpal tunnel syndrome.    INTERPRETING PHYSICIAN:  Penni Bombard, MD Certified in Neurology, Neurophysiology and Neuroimaging  [facility] [address] [place], [place] [address] [phone]   [facility]    Nerve / Sites Muscle Latency Ref. Amplitude Ref. Rel Amp Segments Distance Velocity Ref. Area    ms ms mV mV %  cm m/s m/s mVms  R Median - APB     Wrist APB 3.3 ?4.4 4.5 ?4.0 100 Wrist - APB 7   23.5     Upper arm APB 7.6  4.4  96.8 Upper arm - Wrist 21 49 ?49 18.4  L Median - APB     Wrist APB 3.7 ?4.4 6.2 ?4.0 100 Wrist - APB 7   21.8     Upper arm APB 7.8  6.2  101 Upper arm - Wrist 21 51 ?49 21.8  R Ulnar - ADM     Wrist ADM 2.9 ?3.3 10.7 ?6.0 100 Wrist - ADM 7   23.7     B.Elbow ADM 6.1  9.9  92.7 B.Elbow - Wrist 18 55 ?49 22.9     A.Elbow ADM 8.0  9.9  99.1 A.Elbow - B.Elbow 10 53 ?49 22.7  L Ulnar - ADM     Wrist ADM 2.8 ?3.3 11.7 ?6.0 100 Wrist - ADM 7   21.6     B.Elbow ADM 6.2  9.6  82.4 B.Elbow - Wrist 18 52 ?49 19.8     A.Elbow ADM 8.2  9.3  96.3 A.Elbow -  B.Elbow 10 50 ?49 20.4             SNC    Nerve / Sites Rec. Site Peak Lat Ref.  Amp Ref. Segments Distance Peak Diff Ref.    ms ms V V  cm ms ms  R Median, Ulnar - Transcarpal comparison     Median Palm Wrist 2.4 ?2.2 63 ?35 Median Palm - Wrist 8       Ulnar Palm Wrist 1.9 ?2.2 14 ?12 Ulnar Palm - Wrist 8          Median Palm - Ulnar Palm  0.5 ?0.4  L Median, Ulnar - Transcarpal comparison     Median Palm Wrist 2.4 ?2.2 53 ?35 Median Palm - Wrist 8       Ulnar Palm Wrist 1.8 ?2.2 4 ?12 Ulnar Palm - Wrist 8          Median Palm - Ulnar Palm  0.6 ?0.4  R Median - Orthodromic (Dig II, Mid palm)     Dig II Wrist 3.3 ?3.4 12 ?10 Dig II - Wrist 13    L Median - Orthodromic (Dig II, Mid palm)     Dig II Wrist  3.3 ?3.4 16 ?10 Dig II - Wrist 13    R Ulnar - Orthodromic, (Dig V, Mid palm)     Dig V Wrist 2.7 ?3.1 7 ?5 Dig V - Wrist 11    L Ulnar - Orthodromic, (Dig V, Mid palm)     Dig V Wrist 2.5 ?3.1 5 ?5 Dig V - Wrist 18                   F  Wave    Nerve F Lat Ref.   ms ms  R Ulnar - ADM 29.6 ?32.0  L Ulnar - ADM 29.4 ?32.0         EMG Summary Table    Spontaneous MUAP Recruitment  Muscle IA Fib PSW Fasc Other Amp Dur. Poly Pattern  R. Deltoid Normal None None None _______ Normal Normal Normal Normal  R. Biceps brachii Normal None None None _______ Normal Normal Normal Normal  R. Triceps brachii Normal None None None _______ Normal Normal Normal Normal  R. Flexor carpi radialis Normal None None None _______ Normal Normal Normal Normal  R. First dorsal interosseous Normal None None None _______ Normal Normal Normal Normal

## 2020-03-03 ENCOUNTER — Other Ambulatory Visit: Payer: Self-pay

## 2020-03-03 ENCOUNTER — Ambulatory Visit (INDEPENDENT_AMBULATORY_CARE_PROVIDER_SITE_OTHER): Payer: Medicare Other | Admitting: Internal Medicine

## 2020-03-03 ENCOUNTER — Encounter: Payer: Self-pay | Admitting: Internal Medicine

## 2020-03-03 VITALS — BP 110/70 | HR 88 | Ht 63.5 in | Wt 216.0 lb

## 2020-03-03 DIAGNOSIS — E785 Hyperlipidemia, unspecified: Secondary | ICD-10-CM | POA: Diagnosis not present

## 2020-03-03 DIAGNOSIS — Z6841 Body Mass Index (BMI) 40.0 and over, adult: Secondary | ICD-10-CM

## 2020-03-03 DIAGNOSIS — Z794 Long term (current) use of insulin: Secondary | ICD-10-CM

## 2020-03-03 DIAGNOSIS — E1165 Type 2 diabetes mellitus with hyperglycemia: Secondary | ICD-10-CM

## 2020-03-03 LAB — POCT GLYCOSYLATED HEMOGLOBIN (HGB A1C): Hemoglobin A1C: 9.9 % — AB (ref 4.0–5.6)

## 2020-03-03 MED ORDER — HUMALOG KWIKPEN 200 UNIT/ML ~~LOC~~ SOPN: PEN_INJECTOR | SUBCUTANEOUS | 3 refills | Status: DC

## 2020-03-03 MED ORDER — OZEMPIC (0.25 OR 0.5 MG/DOSE) 2 MG/1.5ML ~~LOC~~ SOPN: 0.5000 mg | PEN_INJECTOR | SUBCUTANEOUS | 3 refills | Status: DC
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
## 2020-03-03 NOTE — Addendum Note (Signed)
Addended by: Cardell Peach I on: 03/03/2020 01:27 PM   Modules accepted: Orders

## 2020-03-03 NOTE — Patient Instructions (Addendum)
Please continue: - Invokana 300 mg in am - Toujeo 38 units at bedtime - Novolog/Humalog 6-10 units 15 min before main meals  Please stop Trulicity and start Ozempic 0.5 mg weekly.  Please come back for a follow-up appointment in 4 months.

## 2020-03-10 ENCOUNTER — Encounter: Payer: Self-pay | Admitting: Physician Assistant

## 2020-03-10 ENCOUNTER — Ambulatory Visit (INDEPENDENT_AMBULATORY_CARE_PROVIDER_SITE_OTHER): Payer: Medicare Other | Admitting: Physician Assistant

## 2020-03-10 VITALS — BP 122/68 | HR 78 | Ht 63.5 in | Wt 219.0 lb

## 2020-03-10 DIAGNOSIS — R112 Nausea with vomiting, unspecified: Secondary | ICD-10-CM | POA: Diagnosis not present

## 2020-03-10 DIAGNOSIS — R1310 Dysphagia, unspecified: Secondary | ICD-10-CM

## 2020-03-10 DIAGNOSIS — K228 Other specified diseases of esophagus: Secondary | ICD-10-CM

## 2020-03-10 DIAGNOSIS — C7A8 Other malignant neuroendocrine tumors: Secondary | ICD-10-CM | POA: Diagnosis not present

## 2020-03-10 DIAGNOSIS — K2289 Other specified disease of esophagus: Secondary | ICD-10-CM

## 2020-03-10 MED ORDER — SUCRALFATE 1 GM/10ML PO SUSP: 1.0000 g | Freq: Four times a day (QID) | ORAL | 1 refills | Status: DC

## 2020-03-10 MED ORDER — AMBULATORY NON FORMULARY MEDICATION: 0 refills | Status: AC

## 2020-03-10 MED ORDER — ESOMEPRAZOLE MAGNESIUM 40 MG PO CPDR: 40.0000 mg | DELAYED_RELEASE_CAPSULE | Freq: Two times a day (BID) | ORAL | 2 refills | Status: DC
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
## 2020-03-10 NOTE — Patient Instructions (Signed)
If you are age 66 or older, your body mass index should be between 23-30. Your Body mass index is 38.19 kg/m. If this is out of the aforementioned range listed, please consider follow up with your Primary Care Provider.  If you are age 65 or younger, your body mass index should be between 19-25. Your Body mass index is 38.19 kg/m. If this is out of the aformentioned range listed, please consider follow up with your Primary Care Provider.   Increase Nexium 40 mg twice daily 30-60 minutes before breakfast and dinner. Carafate 1 g/10 ml take 10 ml 1 hour before each meal or 2 hours after meals and at bedtime daily.  GI  Cocktail take 5-10 ml every 4-6 hours as needed for pain.   Call the office if no better in 1-2 weeks and ask for Koren Shiver, RN.

## 2020-03-14 ENCOUNTER — Telehealth: Payer: Self-pay

## 2020-03-14 NOTE — Telephone Encounter (Signed)
-----   Message from Irving Copas., MD sent at 03/14/2020  2:17 PM EDT ----- Regarding: RE: does pt need EUS? KN and JLL,I am okay with proceeding with the follow-up EUS in 2022.List the patient is having any other issues we should plan for January 2022.Trivia Heffelfinger, can you ensure that we have this patient set up for an EUS recall in January of this coming year? Thanks.GM ----- Message ----- From: Mauri Pole, MD Sent: 03/14/2020   9:39 AM EDT To: Levin Erp, PA, # Subject: RE: does pt need EUS?                          Yes that was the plan per Dr. Elita Boone and Dr. Barry Dienes.  She had dotatate scan January 2021, please check with Chester Holstein when he is planning to repeat EUS for surveillance.   Thank you VN ----- Message ----- From: Levin Erp, PA Sent: 03/10/2020  10:27 AM EDT To: Mauri Pole, MD Subject: does pt need EUS?                              Does this patient need repeat EUS?  From you noted sounded like we were doing alternate years of PET scan and EUS.  Thanks-JLL

## 2020-03-14 NOTE — Telephone Encounter (Signed)
Recall entered  °

## 2020-04-05 NOTE — Progress Notes (Signed)
Reviewed and agree with documentation and assessment and plan. K. Veena Alexx Giambra , MD   

## 2020-05-30 ENCOUNTER — Telehealth: Payer: Self-pay

## 2020-05-30 NOTE — Telephone Encounter (Signed)
Yes, ok to schedule EGD at [facility] for dysphagia and GERD. Thanks

## 2020-05-30 NOTE — Telephone Encounter (Signed)
Left message on machine to call back  

## 2020-05-30 NOTE — Telephone Encounter (Signed)
Mauri Pole, MD  Levin Erp, PA 3 days ago   We can go ahead with EGD for evaluation of her GERD and dysphagia symptoms, next available appointment with me.  Plan was to alternate imaging and EUS every year per Dr Elita Boone and Dr Barry Dienes.  Thanks  VN   Message text   Levin Erp, [place]  Mauri Pole, MD 6 days ago   Patient asking about EGD, I do think it is reasonable, but had wondered if she needed repeat EUS in regards to neuroendocrine tumor (if so, may just need that appt scheduled instead?). Could you look through her chart and let me know?   Thanks-JLL   Message text   You routed conversation to Levin Erp, PA 6 days ago  Dossantos, [name], [name], [place] 6 days ago   [employer], Thank you for refilling my Magic mouthwash. I have also filled the refill on the sucralfate. I have had another acid reflux/throwing up incident and my throat is again really raw and irritated. My throat feels inflamed and constricted at the top of the esophagus. Did you ever followup with Dr. Rush Landmark regarding my next endoscopy timing? I am thinking this would also tell us what is going on in my throat. I may need to schedule another appt. with you or Dr. Silverio Decamp to evaluate me further. Thank you, Paige Hernandez [phone] [email or web address]

## 2020-05-30 NOTE — Telephone Encounter (Signed)
Dr Silverio Decamp can EGD be done in the [place]?

## 2020-05-31 NOTE — Telephone Encounter (Signed)
The pt has been scheduled for previsit and EGD with Dr Silverio Decamp.  The pt has been advised of the information and verbalized understanding.

## 2020-06-08 ENCOUNTER — Ambulatory Visit (AMBULATORY_SURGERY_CENTER): Payer: Self-pay | Admitting: *Deleted

## 2020-06-08 ENCOUNTER — Other Ambulatory Visit: Payer: Self-pay

## 2020-06-08 VITALS — Ht 63.5 in | Wt 211.0 lb

## 2020-06-08 DIAGNOSIS — R1310 Dysphagia, unspecified: Secondary | ICD-10-CM

## 2020-06-08 DIAGNOSIS — K219 Gastro-esophageal reflux disease without esophagitis: Secondary | ICD-10-CM

## 2020-06-08 NOTE — Progress Notes (Signed)
Patient is here in-person for PV. Patient denies any allergies to eggs or soy. Patient denies any problems with anesthesia/sedation. Patient denies any oxygen use at home. Patient denies taking any diet/weight loss medications or blood thinners. Patient is not being treated for MRSA or C-diff. Patient is aware of our care-partner policy and [id number] safety protocol. EMMI education assisgned to the patient for the procedure, sent to MyChart.   COVID-19 vaccines completed on 11/08/19, per patient.  Pt denies any medical changes since last GI OV.

## 2020-06-13 ENCOUNTER — Encounter: Payer: Self-pay | Admitting: Gastroenterology

## 2020-06-13 ENCOUNTER — Other Ambulatory Visit: Payer: Self-pay

## 2020-06-13 ENCOUNTER — Ambulatory Visit (AMBULATORY_SURGERY_CENTER): Payer: Medicare Other | Admitting: Gastroenterology

## 2020-06-13 VITALS — BP 120/79 | HR 70 | Temp 97.8°F | Resp 11

## 2020-06-13 DIAGNOSIS — K2100 Gastro-esophageal reflux disease with esophagitis, without bleeding: Secondary | ICD-10-CM

## 2020-06-13 DIAGNOSIS — R1310 Dysphagia, unspecified: Secondary | ICD-10-CM

## 2020-06-13 MED ORDER — SUCRALFATE 1 G PO TABS: 1.0000 g | ORAL_TABLET | Freq: Four times a day (QID) | ORAL | 1 refills | Status: DC | PRN

## 2020-06-13 MED ORDER — SODIUM CHLORIDE 0.9 % IV SOLN: 500.0000 mL | Freq: Once | INTRAVENOUS | Status: DC

## 2020-06-13 NOTE — Op Note (Signed)
[facility] Patient Name: Paige Hernandez Procedure Date: 06/13/2020 10:36 AM MRN: [id number] Endoscopist: Mauri Pole , MD Age: 64 Referring MD:  Date of Birth: [birth year] Gender: Female Account #: [id number] Procedure:                Upper GI endoscopy Indications:              Dysphagia Medicines:                Monitored Anesthesia Care Procedure:                Pre-Anesthesia Assessment:                           - Prior to the procedure, a History and Physical                            was performed, and patient medications and                            allergies were reviewed. The patient's tolerance of                            previous anesthesia was also reviewed. The risks                            and benefits of the procedure and the sedation                            options and risks were discussed with the patient.                            All questions were answered, and informed consent                            was obtained. Prior Anticoagulants: The patient has                            taken no previous anticoagulant or antiplatelet                            agents. ASA Grade Assessment: III - A patient with                            severe systemic disease. After reviewing the risks                            and benefits, the patient was deemed in                            satisfactory condition to undergo the procedure.                           After obtaining informed consent, the endoscope was  passed under direct vision. Throughout the                            procedure, the patient's blood pressure, pulse, and                            oxygen saturations were monitored continuously. The                            Endoscope was introduced through the mouth, and                            advanced to the second part of duodenum. The upper                            GI endoscopy was accomplished without  difficulty.                            The patient tolerated the procedure well. Scope In: Scope Out: Findings:                 LA Grade B (one or more mucosal breaks greater than                            5 mm, not extending between the tops of two mucosal                            folds) esophagitis was found 33 to 35 cm from the                            incisors.                           One benign-appearing, intrinsic mild                            (non-circumferential scarring) stenosis was found                            36 to 37 cm from the incisors. This stenosis                            measured less than one cm (in length). The stenosis                            was traversed.                           A large amount of food (residue) was found in the                            gastric fundus and in the gastric body, no pyloric  stenosis or gastric outlet obstruction.                           The limited examined duodenum was normal with no                            evidence of stenosis. Complications:            No immediate complications. Estimated Blood Loss:     Estimated blood loss: none. Impression:               - LA Grade B reflux esophagitis.                           - Benign-appearing esophageal stenosis.                           - A large amount of food (residue) in the stomach.                           - Normal examined duodenum.                           - No specimens collected. Recommendation:           - Gastroparesis diet. Avoid high fiber and high fat                            diet. Small frequent meals                           - Schedule 4 hour gastric emptying scan, next                            available appointment                           - Follow up GI office visit in 4-6 weeks and will                            plan to reschedule EGD during office visit                           - Continue present  medications.                           - Continue present medications.                           - Follow an antireflux regimen indefinitely.                           - Use Nexium (esomeprazole) 40 mg PO BID for 3                            months.                           -  Use sucralfate tablets 1 gram PO QID PRN X 120                            tabs with 1 refill Mauri Pole, MD 06/13/2020 10:51:26 AM This report has been signed electronically.

## 2020-06-13 NOTE — Progress Notes (Signed)
To PACU, VSS. Report to [email or web address]

## 2020-06-13 NOTE — Patient Instructions (Signed)
Handouts given for GERD and Gastroparesis diet  PICK UP 2 NEW RX'S   YOU HAD AN ENDOSCOPIC PROCEDURE TODAY AT [facility]:   Refer to the procedure report that was given to you for any specific questions about what was found during the examination.  If the procedure report does not answer your questions, please call your gastroenterologist to clarify.  If you requested that your care partner not be given the details of your procedure findings, then the procedure report has been included in a sealed envelope for you to review at your convenience later.  YOU SHOULD EXPECT: Some feelings of bloating in the abdomen. Passage of more gas than usual.  Walking can help get rid of the air that was put into your GI tract during the procedure and reduce the bloating. If you had a lower endoscopy (such as a colonoscopy or flexible sigmoidoscopy) you may notice spotting of blood in your stool or on the toilet paper. If you underwent a bowel prep for your procedure, you may not have a normal bowel movement for a few days.  Please Note:  You might notice some irritation and congestion in your nose or some drainage.  This is from the oxygen used during your procedure.  There is no need for concern and it should clear up in a day or so.  SYMPTOMS TO REPORT IMMEDIATELY:   Following upper endoscopy (EGD)  Vomiting of blood or coffee ground material  New chest pain or pain under the shoulder blades  Painful or persistently difficult swallowing  New shortness of breath  Fever of 100F or higher  Black, tarry-looking stools  For urgent or emergent issues, a gastroenterologist can be reached at any hour by calling [phone]. Do not use MyChart messaging for urgent concerns.    DIET:  We do recommend a small meal at first, but then you may proceed to your low fiber gastroparesis diet.  Drink plenty of fluids but you should avoid alcoholic beverages for 24 hours.  ACTIVITY:  You should plan  to take it easy for the rest of today and you should NOT DRIVE or use heavy machinery until tomorrow (because of the sedation medicines used during the test).    FOLLOW UP: Our staff will call the number listed on your records 48-72 hours following your procedure to check on you and address any questions or concerns that you may have regarding the information given to you following your procedure. If we do not reach you, we will leave a message.  We will attempt to reach you two times.  During this call, we will ask if you have developed any symptoms of COVID 19. If you develop any symptoms (ie: fever, flu-like symptoms, shortness of breath, cough etc.) before then, please call [phone].  If you test positive for Covid 19 in the 2 weeks post procedure, please call and report this information to [place].    If any biopsies were taken you will be contacted by phone or by letter within the next 1-3 weeks.  Please call us at [phone] if you have not heard about the biopsies in 3 weeks.    SIGNATURES/CONFIDENTIALITY: You and/or your care partner have signed paperwork which will be entered into your electronic medical record.  These signatures attest to the fact that that the information above on your After Visit Summary has been reviewed and is understood.  Full responsibility of the confidentiality of this discharge information lies with you  and/or your care-partner. 

## 2020-06-13 NOTE — Progress Notes (Signed)
Pt's states no medical or surgical changes since previsit or office visit.  Cw vitals and AW IV.

## 2020-06-14 ENCOUNTER — Other Ambulatory Visit: Payer: Self-pay

## 2020-06-14 DIAGNOSIS — K3184 Gastroparesis: Secondary | ICD-10-CM

## 2020-06-14 DIAGNOSIS — R198 Other specified symptoms and signs involving the digestive system and abdomen: Secondary | ICD-10-CM

## 2020-06-15 ENCOUNTER — Telehealth: Payer: Self-pay | Admitting: *Deleted

## 2020-06-15 NOTE — Telephone Encounter (Signed)
  Follow up Call-  Call back number 06/13/2020 04/28/2018 12/30/2017  Post procedure Call Back phone  # [phone] cell [phone]  Permission to leave phone message Yes Yes Yes  Some recent data might be hidden     Patient questions:  Do you have a fever, pain , or abdominal swelling? No. Pain Score  0 *  Have you tolerated food without any problems? Yes.    Have you been able to return to your normal activities? Yes.    Do you have any questions about your discharge instructions: Diet   No. Medications  No. Follow up visit  No.  Do you have questions or concerns about your Care? No.  Actions: * If pain score is 4 or above: 1. No action needed, pain <4.Have you developed a fever since your procedure? no  2.   Have you had an respiratory symptoms (SOB or cough) since your procedure? no  3.   Have you tested positive for COVID 19 since your procedure no  4.   Have you had any family members/close contacts diagnosed with the COVID 19 since your procedure?  no   If yes to any of these questions please route to Joylene John, RN and Joella Prince, RN

## 2020-06-28 ENCOUNTER — Encounter (HOSPITAL_COMMUNITY): Payer: Medicare Other

## 2020-07-07 ENCOUNTER — Ambulatory Visit: Payer: Medicare Other | Admitting: Gastroenterology

## 2020-07-08 ENCOUNTER — Ambulatory Visit (INDEPENDENT_AMBULATORY_CARE_PROVIDER_SITE_OTHER): Payer: Medicare Other | Admitting: Internal Medicine

## 2020-07-08 ENCOUNTER — Other Ambulatory Visit: Payer: Self-pay

## 2020-07-08 ENCOUNTER — Encounter: Payer: Self-pay | Admitting: Internal Medicine

## 2020-07-08 VITALS — BP 110/74 | HR 100 | Ht 63.5 in | Wt 210.0 lb

## 2020-07-08 DIAGNOSIS — Z794 Long term (current) use of insulin: Secondary | ICD-10-CM | POA: Diagnosis not present

## 2020-07-08 DIAGNOSIS — E785 Hyperlipidemia, unspecified: Secondary | ICD-10-CM

## 2020-07-08 DIAGNOSIS — E1165 Type 2 diabetes mellitus with hyperglycemia: Secondary | ICD-10-CM | POA: Diagnosis not present

## 2020-07-08 DIAGNOSIS — Z6841 Body Mass Index (BMI) 40.0 and over, adult: Secondary | ICD-10-CM

## 2020-07-08 LAB — POCT GLYCOSYLATED HEMOGLOBIN (HGB A1C): Hemoglobin A1C: 11.9 % — AB (ref 4.0–5.6)
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
## 2020-07-08 NOTE — Patient Instructions (Addendum)
Please start taking consistently: - Invokana 300 mg in am - Toujeo 38 units at bedtime - Humalog 8-12 units 15 min before main meals  Start checking sugars before meals.  Please come back for a follow-up appointment in 1.5 months.

## 2020-07-08 NOTE — Addendum Note (Signed)
Addended by: Caprice Beaver T on: 07/08/2020 10:46 AM   Modules accepted: Orders

## 2020-07-13 ENCOUNTER — Encounter: Payer: Medicare Other | Attending: Internal Medicine | Admitting: Nutrition

## 2020-07-13 ENCOUNTER — Other Ambulatory Visit: Payer: Self-pay

## 2020-07-13 DIAGNOSIS — E1165 Type 2 diabetes mellitus with hyperglycemia: Secondary | ICD-10-CM | POA: Diagnosis not present

## 2020-07-13 DIAGNOSIS — Z794 Long term (current) use of insulin: Secondary | ICD-10-CM | POA: Insufficient documentation

## 2020-07-13 NOTE — Patient Instructions (Addendum)
Take your Toujeo insulin every night Take humalog insulin before all meals Call us to let us know if you would like to continue to use the Dexcom

## 2020-07-13 NOTE — Progress Notes (Signed)
Patient was identified by name and DOB.  WE discussed how the dexcom works, the difference between sensor readings and blood sugar readings, and the differences between the LIbre and the Dexcom.  She wished to go with the Dexcom, so she will not have to scan the device to see the readings.  She reported good understanding of all of the above topics. She was given a Dexcom starter kit:  Lot:[id number] Exp. 5/22.  She inserted the sensor into her right upper outer arm and was told not to inject insulin within 4 inches of this area.  She replied that she does not always take her insulin.  stressed need to take daily injections at bedtime of her long acting insulin that will deliver insulin throughout the day and night, as well as meal time insulin before she eats.  She says she is hoping the sensor's high readings will remind her to do this. The dexcom G6 and clarity app were downloaded to her phone, as she was linked to our practice.  She will call us to let us know if she wants to continue this.  She was told that because she is medicare, it will have to go through a distributor to be sent to her home.  She agreed to this. WE set up alarms for lows: less than 75, and highs greater than 300.  (she says that she is usually over 250, or 300 before meals).   She had no final questions. 

## 2020-08-18 ENCOUNTER — Telehealth: Payer: Self-pay | Admitting: Dietician

## 2020-08-19 ENCOUNTER — Encounter: Payer: Self-pay | Admitting: Internal Medicine

## 2020-08-19 ENCOUNTER — Ambulatory Visit (INDEPENDENT_AMBULATORY_CARE_PROVIDER_SITE_OTHER): Payer: Medicare Other | Admitting: Internal Medicine

## 2020-08-19 ENCOUNTER — Other Ambulatory Visit: Payer: Self-pay

## 2020-08-19 VITALS — BP 128/88 | HR 74 | Ht 63.0 in | Wt 219.0 lb

## 2020-08-19 DIAGNOSIS — E785 Hyperlipidemia, unspecified: Secondary | ICD-10-CM

## 2020-08-19 DIAGNOSIS — Z6841 Body Mass Index (BMI) 40.0 and over, adult: Secondary | ICD-10-CM

## 2020-08-19 DIAGNOSIS — E1165 Type 2 diabetes mellitus with hyperglycemia: Secondary | ICD-10-CM | POA: Diagnosis not present

## 2020-08-19 DIAGNOSIS — Z794 Long term (current) use of insulin: Secondary | ICD-10-CM | POA: Diagnosis not present

## 2020-08-19 MED ORDER — HUMALOG KWIKPEN 200 UNIT/ML ~~LOC~~ SOPN
PEN_INJECTOR | SUBCUTANEOUS | 3 refills | Status: DC
Start: 2020-08-19 — End: 2020-11-02

## 2020-08-19 NOTE — Patient Instructions (Addendum)
Please continue: - Invokana 300 mg in am  Please increase: - Toujeo 42 units at bedtime - Humalog/NovoLog 10-16 units 15 min before main meals  Try to start the Dexcom CGM.  Please come back for a follow-up appointment in 2 months.
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
## 2020-09-04 IMAGING — CT NM PET NOPR SKULL BASE TO THIGH
7 series · 25 of 25 positions shown · non-contrast
Comparison: CT 05/14/2018

CLINICAL DATA: Neuroendocrine tumor of the duodenum status post an
resection with positive margins. Elevated chromogranin A.

EXAM:
NUCLEAR MEDICINE PET SKULL BASE TO THIGH
TECHNIQUE: 5.4 mCi Ga 68 DOTATATE was injected intravenously. Full-ring PET
imaging was performed from the skull base to thigh after the
radiotracer. CT data was obtained and used for attenuation
correction and anatomic localization.

[Series 3: pet sk_thigh ac · axial · 5.0mm · 4.07mm/px · z∈[-886,-6]mm · 5 of 221 slices shown]
[im 1/221]
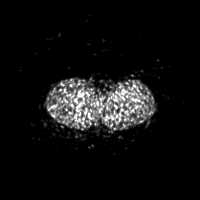
[im 56/221]
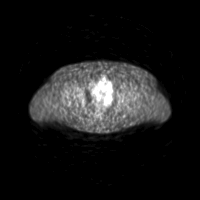
[im 111/221]
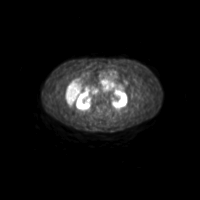
[im 166/221]
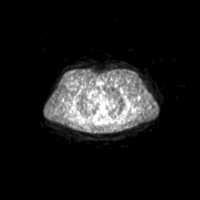
[im 221/221]
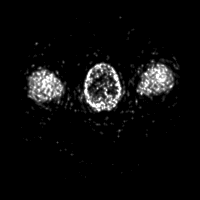

[Series 4: ct sk_thigh 5.0 hd_fov · axial · 5.0mm · 1.17mm/px · z∈[-886,-6]mm · 5 of 219 slices shown]
[im 1/219  brain]
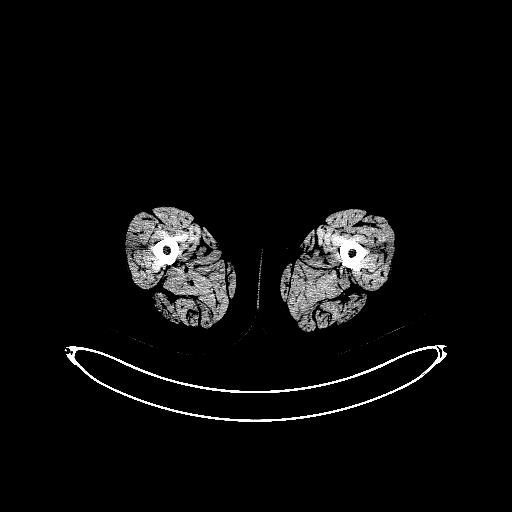
[im 55/219]
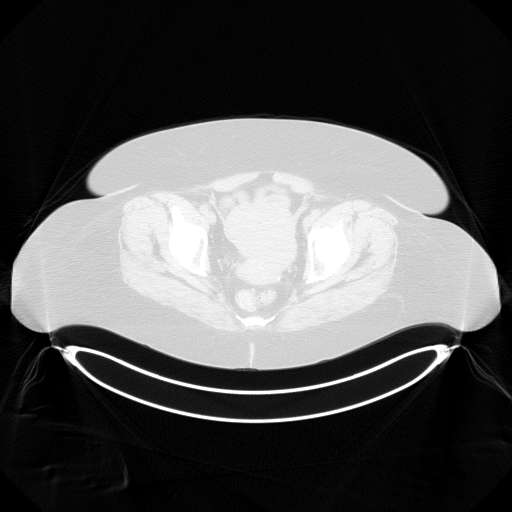
[im 110/219]
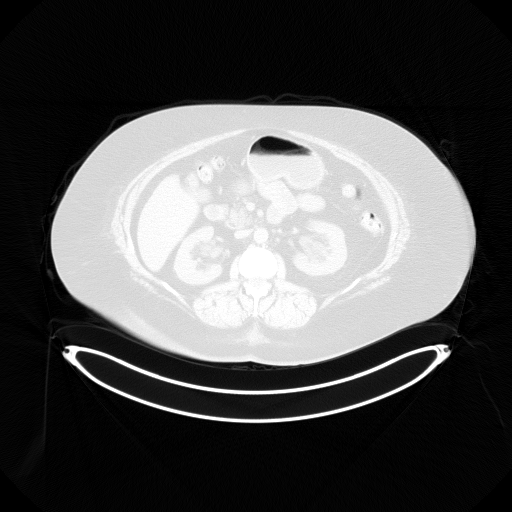
[im 164/219]
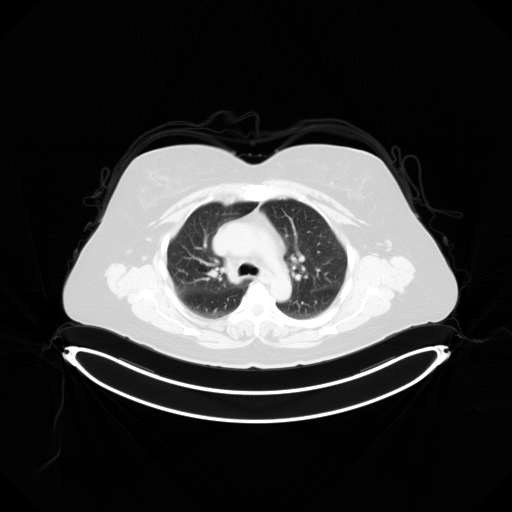
[im 219/219  brain]
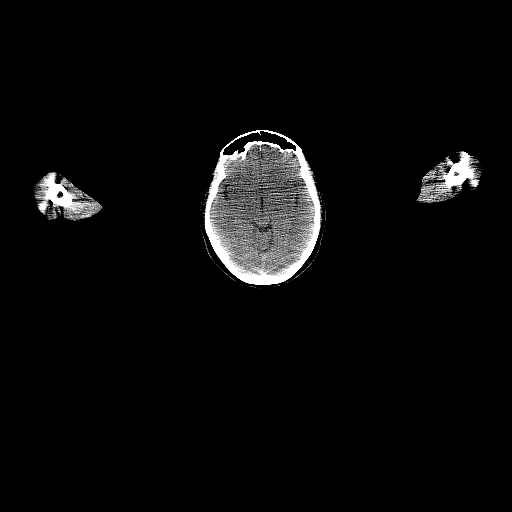

[Series 5: pet sk_thigh nac · axial · 5.0mm · 4.07mm/px · z∈[-886,-6]mm · 5 of 221 slices shown]
[im 1/221]
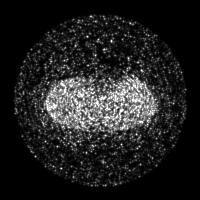
[im 56/221]
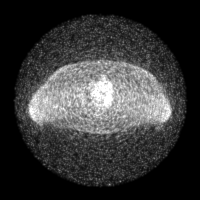
[im 111/221]
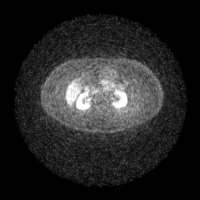
[im 166/221]
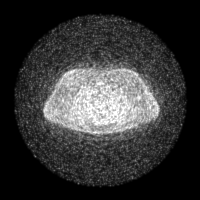
[im 221/221]
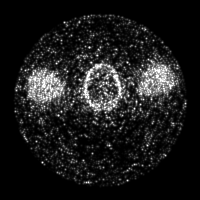

[Series 8: ct sk_thigh 5.0 b70f (id)_bone · axial · 5.0mm · 0.67mm/px · z∈[-394,-138]mm · 2 of 65 slices shown]
[im 1/65  bone]
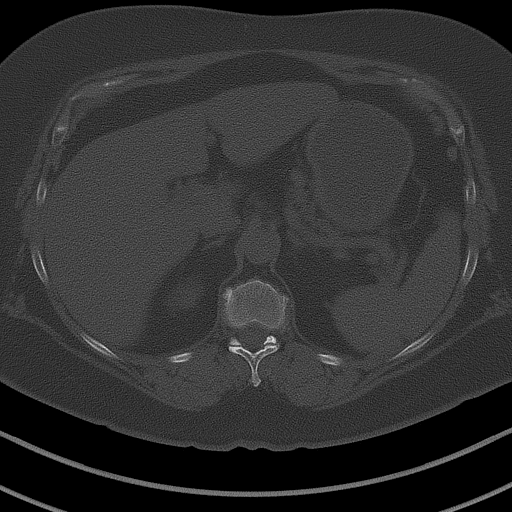
[im 65/65  bone]
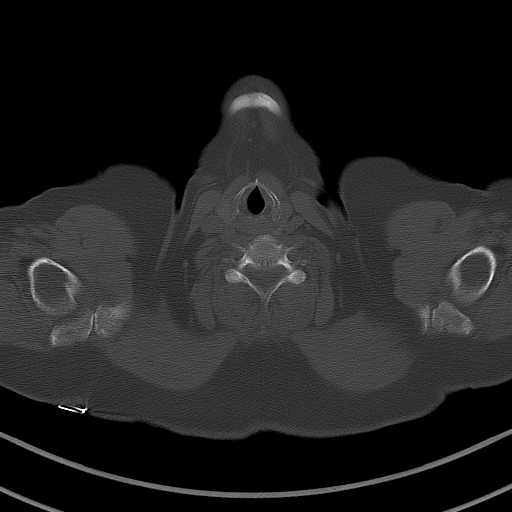

[Series 603: mip range · coronal · 1.83mm/px · 1 of 32 slices shown]
[im 1/32]
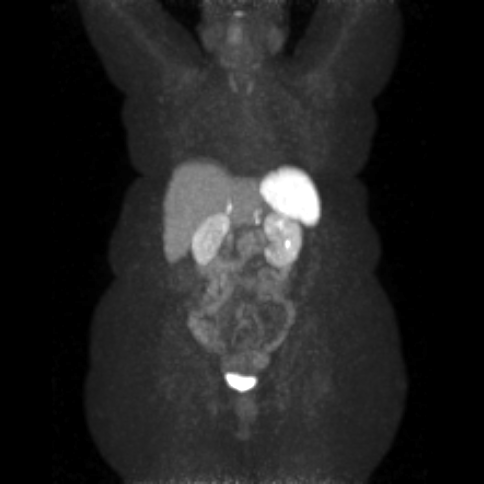

[Series 605: range-ct sk_thigh 5.0 hd_fov-cor-<alpha range(1)> · 2 of 77 slices shown]
[im 1/77]
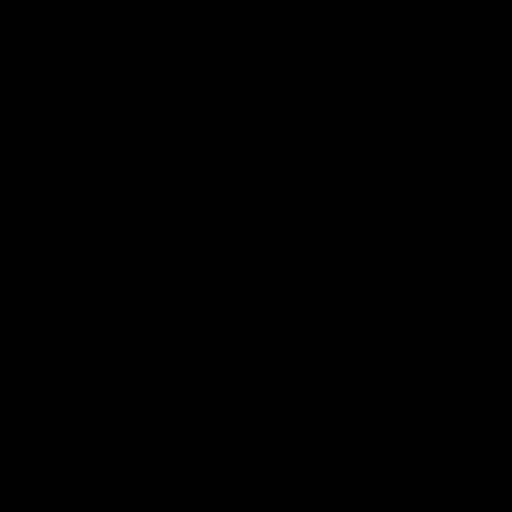
[im 77/77]
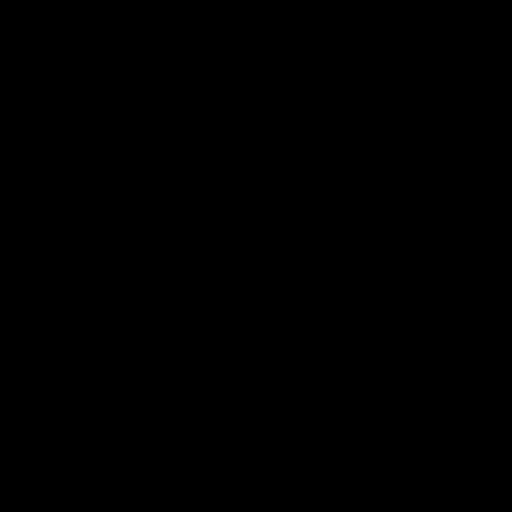

[Series 606: range-ct sk_thigh 5.0 hd_fov-tra-<alpha range> · 5 of 206 slices shown]
[im 1/206]
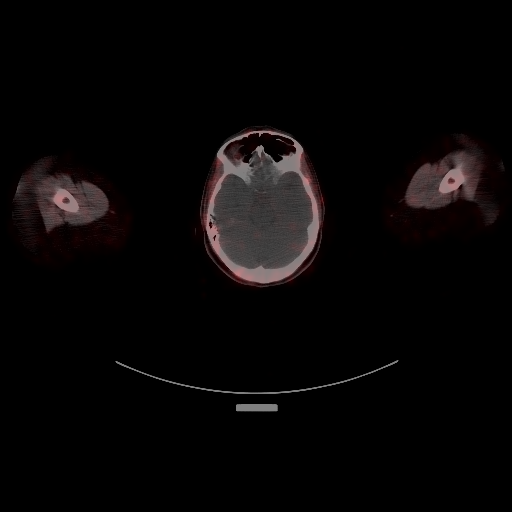
[im 52/206]
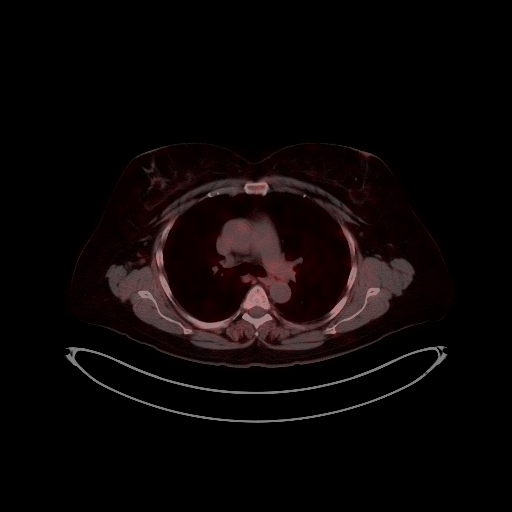
[im 103/206]
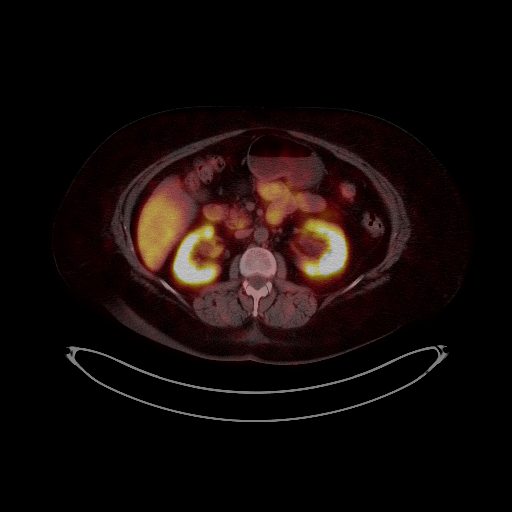
[im 154/206]
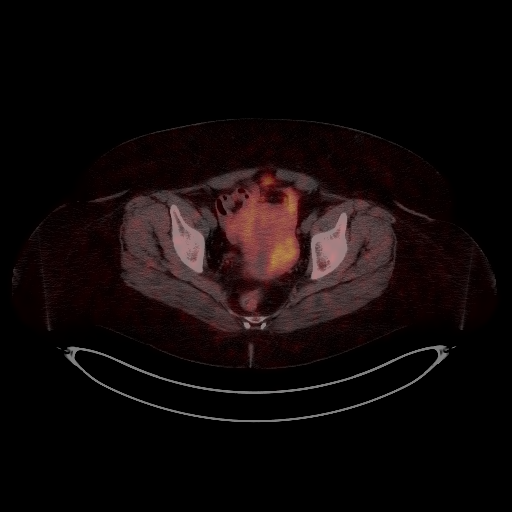
[im 206/206]
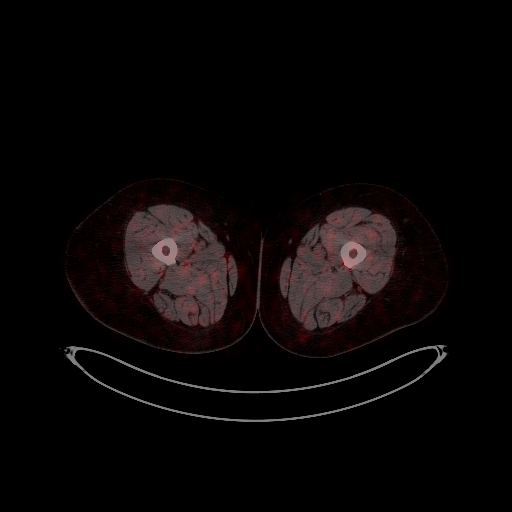

[25 of 25 positions shown; findings below may reference images not displayed]

FINDINGS: NECK

No radiotracer activity in neck lymph nodes.

Incidental CT findings: None

CHEST

No radiotracer accumulation within mediastinal or hilar lymph nodes.
No suspicious pulmonary nodules on the CT scan.

Incidental CT finding:None

ABDOMEN/PELVIS

No focal radiotracer activity within the stomach or duodenum above
background bowel activity. No abnormal accumulation radiotracer
within abdominal, periportal or retroperitoneal lymph nodes.

Physiologic activity noted in the uncinate of the pancreas.

No evidence of liver metastasis. No evidence of bowel lesion
elsewhere in the abdomen pelvis.

Additional physiologic activity noted in the liver, spleen, adrenal
glands and kidneys.

Incidental CT findings:Uterus normal.

SKELETON

No focal activity to suggest skeletal metastasis.

Incidental CT findings:None
IMPRESSION: 1. Gallium 68 DOTATATE PET imaging demonstrates no evidence of
primary neuroendocrine tumor in the duodenum.
2. No evidence of local nodal metastasis.
3. No evidence of distant metastatic disease.

## 2020-09-12 ENCOUNTER — Telehealth: Payer: Self-pay | Admitting: Internal Medicine

## 2020-09-12 NOTE — Telephone Encounter (Signed)
Left message for patient to call back and schedule Medicare Annual Wellness Visit (AWV) either virtually or in office.   Last AWV no information please schedule at anytime with [facility] Nurse Health Advisor 1 or 2   This should be a 45 minute visit. 

## 2020-10-18 ENCOUNTER — Other Ambulatory Visit: Payer: Self-pay | Admitting: Internal Medicine

## 2020-10-18 ENCOUNTER — Telehealth: Payer: Self-pay | Admitting: Nutrition

## 2020-10-18 MED ORDER — DEXCOM G6 TRANSMITTER MISC: 1.0000 | 3 refills | Status: DC

## 2020-10-18 MED ORDER — DEXCOM G6 RECEIVER DEVI: 1.0000 | Freq: Once | 0 refills | Status: AC

## 2020-10-18 MED ORDER — DEXCOM G6 SENSOR MISC: 1.0000 | 3 refills | Status: AC

## 2020-10-18 NOTE — Telephone Encounter (Signed)
Thank you, Vaughan Basta, I sent them.

## 2020-10-18 NOTE — Telephone Encounter (Signed)
Patient left message yesterday on my voice mail requesting that she get the Dexcom sensor.  She reports that she is seeing Dr. Cruzita Lederer tomorrow for a visit and will tell her that she wants this, but had questions weather she should wait until the 7 series comes out.  I talked with the Dexcom rep. Who says that it will be 6 months before approval and into the pharmacies and distributors where houses.   I explained this to her and she does not want to wait.   She was told that Dr. Cruzita Lederer will send script to [facility], who will determine if she can get this cheaper as a pharmacy benefit or DME.  If DME is cheaper, they will forward the script to the distributor whom her [employer] works with.  She reported good understanding of this and was given [employer]'s phone number to call if she does not hear back from them in a few days.

## 2020-10-20 ENCOUNTER — Other Ambulatory Visit: Payer: Self-pay

## 2020-10-20 ENCOUNTER — Encounter: Payer: Self-pay | Admitting: Internal Medicine

## 2020-10-20 ENCOUNTER — Ambulatory Visit (INDEPENDENT_AMBULATORY_CARE_PROVIDER_SITE_OTHER): Payer: Medicare Other | Admitting: Internal Medicine

## 2020-10-20 VITALS — BP 128/82 | HR 80 | Ht 63.0 in | Wt 222.4 lb

## 2020-10-20 DIAGNOSIS — Z794 Long term (current) use of insulin: Secondary | ICD-10-CM

## 2020-10-20 DIAGNOSIS — E785 Hyperlipidemia, unspecified: Secondary | ICD-10-CM | POA: Diagnosis not present

## 2020-10-20 DIAGNOSIS — Z6841 Body Mass Index (BMI) 40.0 and over, adult: Secondary | ICD-10-CM

## 2020-10-20 DIAGNOSIS — E1165 Type 2 diabetes mellitus with hyperglycemia: Secondary | ICD-10-CM | POA: Diagnosis not present

## 2020-10-20 LAB — COMPREHENSIVE METABOLIC PANEL
ALT: 23 U/L (ref 0–35)
AST: 18 U/L (ref 0–37)
Albumin: 3.9 g/dL (ref 3.5–5.2)
Alkaline Phosphatase: 89 U/L (ref 39–117)
BUN: 16 mg/dL (ref 6–23)
CO2: 26 mEq/L (ref 19–32)
Calcium: 9.5 mg/dL (ref 8.4–10.5)
Chloride: 102 mEq/L (ref 96–112)
Creatinine, Ser: 1.02 mg/dL (ref 0.40–1.20)
GFR: 57.44 mL/min — ABNORMAL LOW (ref >60.00)
Glucose, Bld: 326 mg/dL — ABNORMAL HIGH (ref 70–99)
Potassium: 4.4 mEq/L (ref 3.5–5.1)
Sodium: 134 mEq/L — ABNORMAL LOW (ref 135–145)
Total Bilirubin: 0.7 mg/dL (ref 0.2–1.2)
Total Protein: 6.8 g/dL (ref 6.0–8.3)

## 2020-10-20 LAB — POCT GLYCOSYLATED HEMOGLOBIN (HGB A1C): Hemoglobin A1C: 10.1 % — AB (ref 4.0–5.6)

## 2020-10-20 LAB — LIPID PANEL
Cholesterol: 230 mg/dL — ABNORMAL HIGH (ref 0–200)
HDL: 62.8 mg/dL (ref >39.00)
LDL Cholesterol: 141 mg/dL — ABNORMAL HIGH (ref 0–99)
NonHDL: 167.28
Total CHOL/HDL Ratio: 4
Triglycerides: 129 mg/dL (ref 0.0–149.0)
VLDL: 25.8 mg/dL (ref 0.0–40.0)

## 2020-10-20 LAB — MICROALBUMIN / CREATININE URINE RATIO
Creatinine,U: 281.8 mg/dL
Microalb Creat Ratio: 1 mg/g (ref 0.0–30.0)
Microalb, Ur: 2.9 mg/dL — ABNORMAL HIGH (ref 0.0–1.9)
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
## 2020-10-20 NOTE — Patient Instructions (Addendum)
Please continue: - Invokana 300 mg in am - Toujeo 42 units at bedtime - Humalog 10-16 units 15 min before main meals  Try to start the Dexcom CGM.  Please come back for a follow-up appointment in 3 months.

## 2020-10-21 ENCOUNTER — Encounter: Payer: Self-pay | Admitting: Internal Medicine

## 2020-10-25 ENCOUNTER — Telehealth: Payer: Self-pay

## 2020-10-25 NOTE — Telephone Encounter (Signed)
CGM and supplies order form completed and faxed to [phone] with last OV clinical notes.

## 2020-11-01 ENCOUNTER — Other Ambulatory Visit: Payer: Self-pay | Admitting: Internal Medicine

## 2020-11-01 ENCOUNTER — Telehealth: Payer: Self-pay | Admitting: Internal Medicine

## 2020-11-01 DIAGNOSIS — E1165 Type 2 diabetes mellitus with hyperglycemia: Secondary | ICD-10-CM

## 2020-11-01 NOTE — Telephone Encounter (Signed)
MEDICATION: New RX with upped dosage for  Insulin Glargine, 1 Unit Dial, (TOUJEO SOLOSTAR) 300 UNIT/ML SOPN (Patient states new dosage is 42 units)    AND  insulin lispro (HUMALOG KWIKPEN) 200 UNIT/ML KwikPen (with upped dosage which Patient states she  takes 16 units-3 x per day-Patient states she takes additional up to 12 units  3 x per day-Patient states she takes the above medication 5+ x per day)  PHARMACY:    [facility] [place] Phone:  [phone]  Fax:  [phone]      HAS THE PATIENT CONTACTED [place]?  Yes  IS THIS A 90 DAY SUPPLY :  Yes-90 day supply for both of the above listed medications  IS PATIENT OUT OF MEDICATION: No  IF NOT; HOW MUCH IS LEFT: Approx. 4 days  LAST APPOINTMENT DATE: @3 /09/2020  NEXT APPOINTMENT DATE:@5 /27/2022  DO WE HAVE YOUR PERMISSION TO LEAVE A DETAILED MESSAGE?: Yes  OTHER COMMENTS:    **Let patient know to contact pharmacy at the end of the day to make sure medication is ready. **  ** Please notify patient to allow 48-72 hours to process**  **Encourage patient to contact the pharmacy for refills or they can request refills through [facility]**

## 2020-11-02 MED ORDER — HUMALOG KWIKPEN 200 UNIT/ML ~~LOC~~ SOPN: PEN_INJECTOR | SUBCUTANEOUS | 3 refills | Status: DC

## 2020-11-02 MED ORDER — TOUJEO SOLOSTAR 300 UNIT/ML ~~LOC~~ SOPN: 42.0000 [IU] | PEN_INJECTOR | Freq: Every day | SUBCUTANEOUS | 1 refills | Status: DC

## 2020-11-02 NOTE — Telephone Encounter (Signed)
Rx sent to preferred pharmacy.

## 2020-11-07 ENCOUNTER — Telehealth: Payer: Self-pay | Admitting: Internal Medicine

## 2020-11-07 DIAGNOSIS — Z794 Long term (current) use of insulin: Secondary | ICD-10-CM

## 2020-11-07 DIAGNOSIS — E1165 Type 2 diabetes mellitus with hyperglycemia: Secondary | ICD-10-CM

## 2020-11-07 MED ORDER — HUMALOG KWIKPEN 200 UNIT/ML ~~LOC~~ SOPN: PEN_INJECTOR | SUBCUTANEOUS | 3 refills | Status: DC

## 2020-11-07 NOTE — Telephone Encounter (Signed)
Pt states she needs clarification on the dosage for her Humalog.  Pt is saying her blood sugars are staying between 200-387 and her instructed 48 units won't bring her to where she should be and she is trying to be compliant with her medication. Pt wishes for someone to give her a call to go over how much she should be taking and if she would be able to add more to account for snacks.  Callback# [phone]

## 2020-11-07 NOTE — Addendum Note (Signed)
Addended by: Lauralyn Primes on: 11/07/2020 02:39 PM   Modules accepted: Orders

## 2020-11-07 NOTE — Telephone Encounter (Signed)
Rx corrected and sent to preferred pharmacy. 

## 2020-11-08 NOTE — Telephone Encounter (Signed)
Please advise 

## 2020-11-08 NOTE — Telephone Encounter (Signed)
Called and advised pt  We can increase the doses to: - 50 units of Toujeo daily - 18-20 units of Humalog before each meal and she can take 8 units for example, before snacks. Pt verbalized understanding.

## 2020-11-08 NOTE — Telephone Encounter (Signed)
T, Per review of the chart, she is taking 42 units of Toujeo and 16 units of Humalog before each meal. We can increase the doses to: - 50 units of Toujeo daily - 18-20 units of Humalog before each meal and she can take 8 units for example, before snacks. However, there is only so much that we can do with the insulin regimen, and she ideally would also adjust the diet.  For example, stop snacks.  Also, look at individual meals and see if she can eliminate concentrated sweets, fried foods, highly processed foods.

## 2020-11-09 ENCOUNTER — Telehealth: Payer: Self-pay | Admitting: Internal Medicine

## 2020-11-09 DIAGNOSIS — E1165 Type 2 diabetes mellitus with hyperglycemia: Secondary | ICD-10-CM

## 2020-11-09 MED ORDER — INSULIN PEN NEEDLE 32G X 4 MM MISC: 0 refills | Status: DC

## 2020-11-09 NOTE — Telephone Encounter (Signed)
Rx sent to preferred pharmacy. Previously discussed prescriptions have already been sent.

## 2020-11-09 NOTE — Telephone Encounter (Signed)
MEDICATION: novofine plus 32g x 69mm disposable needles   PHARMACY:   [facility] [place] Phone:  [phone]  Fax:  [phone]      HAS THE PATIENT CONTACTED THEIR PHARMACY?  no  IS THIS A 90 DAY SUPPLY :  No, it's 2 boxes of 100   IS PATIENT OUT OF MEDICATION:  no  IF NOT; HOW MUCH IS LEFT: 60 (~4 days)  LAST APPOINTMENT DATE: @3 /03/2021  NEXT APPOINTMENT DATE:@5 /27/2022  DO WE HAVE YOUR PERMISSION TO LEAVE A DETAILED MESSAGE?: yes  OTHER COMMENTS:  Pt states its been over a year since she has picked these up, but pt states she is trying to be compliant now.  Pt also states she spoke to [place] yesterday regarding a Humalog prescription. Pt just wants to make sure that's going to be sent to [facility] as well. Pt knows it was late yesterday when they spoke so she wasn't expecting it to be put in then but just wanted to make nurse aware.  **Let patient know to contact pharmacy at the end of the day to make sure medication is ready. **  ** Please notify patient to allow 48-72 hours to process**  **Encourage patient to contact the pharmacy for refills or they can request refills through [facility]**

## 2020-11-15 MED ORDER — HUMALOG KWIKPEN 200 UNIT/ML ~~LOC~~ SOPN: PEN_INJECTOR | SUBCUTANEOUS | 3 refills | Status: DC

## 2020-11-15 NOTE — Telephone Encounter (Signed)
Updated rx sent to preferred pharmacy. E-Prescribing Status: Sent to pharmacy (11/15/2020 1:32 PM EDT)

## 2020-11-15 NOTE — Telephone Encounter (Signed)
Patient called and requested that her RX for Humalog please be updated to reflect new dosing instructions:  New dosing is - 18-20 units of Humalog before each meal and she can take 8 units for example, before snacks.  Please send to [facility]
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
## 2020-12-01 ENCOUNTER — Other Ambulatory Visit: Payer: Self-pay

## 2020-12-01 ENCOUNTER — Encounter: Payer: Self-pay | Admitting: Internal Medicine

## 2020-12-01 ENCOUNTER — Ambulatory Visit (INDEPENDENT_AMBULATORY_CARE_PROVIDER_SITE_OTHER): Payer: Medicare Other

## 2020-12-01 VITALS — BP 122/72 | HR 91 | Temp 97.6°F | Wt 232.0 lb

## 2020-12-01 DIAGNOSIS — Z1231 Encounter for screening mammogram for malignant neoplasm of breast: Secondary | ICD-10-CM | POA: Diagnosis not present

## 2020-12-01 DIAGNOSIS — Z0000 Encounter for general adult medical examination without abnormal findings: Secondary | ICD-10-CM | POA: Diagnosis not present

## 2020-12-01 DIAGNOSIS — Z780 Asymptomatic menopausal state: Secondary | ICD-10-CM

## 2020-12-01 NOTE — Patient Instructions (Signed)
Paige Hernandez , Thank you for taking time to come for your Medicare Wellness Visit. I appreciate your ongoing commitment to your health goals. Please review the following plan we discussed and let me know if I can assist you in the future.   Screening recommendations/referrals: Colonoscopy: completed 12/30/2017 due in 5 years Mammogram: Orders placed Bone Density: Orders placed Recommended yearly ophthalmology/optometry visit for glaucoma screening and checkup Recommended yearly dental visit for hygiene and checkup  Vaccinations: Influenza vaccine: due fall 2022 Pneumococcal vaccine: Completed series Tdap vaccine: 04/27/2013  Due 04/28/2023 Shingles vaccine: complete series   Advanced directives: Patient will bring copies to next office visit  Conditions/risks identified: None  Next appointment: 12/21/2020 @1000  with Dr. Regis Bill    Preventive Care 67 Years and Older, Female Preventive care refers to lifestyle choices and visits with your health care provider that can promote health and wellness. What does preventive care include?  A yearly physical exam. This is also called an annual well check.  Dental exams once or twice a year.  Routine eye exams. Ask your health care provider how often you should have your eyes checked.  Personal lifestyle choices, including:  Daily care of your teeth and gums.  Regular physical activity.  Eating a healthy diet.  Avoiding tobacco and drug use.  Limiting alcohol use.  Practicing safe sex.  Taking low-dose aspirin every day.  Taking vitamin and mineral supplements as recommended by your health care provider. What happens during an annual well check? The services and screenings done by your health care provider during your annual well check will depend on your age, overall health, lifestyle risk factors, and family history of disease. Counseling  Your health care provider may ask you questions about your:  Alcohol use.  Tobacco  use.  Drug use.  Emotional well-being.  Home and relationship well-being.  Sexual activity.  Eating habits.  History of falls.  Memory and ability to understand (cognition).  Work and work Statistician.  Reproductive health. Screening  You may have the following tests or measurements:  Height, weight, and BMI.  Blood pressure.  Lipid and cholesterol levels. These may be checked every 5 years, or more frequently if you are over 38 years old.  Skin check.  Lung cancer screening. You may have this screening every year starting at age 67 if you have a 30-pack-year history of smoking and currently smoke or have quit within the past 15 years.  Fecal occult blood test (FOBT) of the stool. You may have this test every year starting at age 67.  Flexible sigmoidoscopy or colonoscopy. You may have a sigmoidoscopy every 5 years or a colonoscopy every 10 years starting at age 67.  Hepatitis C blood test.  Hepatitis B blood test.  Sexually transmitted disease (STD) testing.  Diabetes screening. This is done by checking your blood sugar (glucose) after you have not eaten for a while (fasting). You may have this done every 1-3 years.  Bone density scan. This is done to screen for osteoporosis. You may have this done starting at age 67.  Mammogram. This may be done every 1-2 years. Talk to your health care provider about how often you should have regular mammograms. Talk with your health care provider about your test results, treatment options, and if necessary, the need for more tests. Vaccines  Your health care provider may recommend certain vaccines, such as:  Influenza vaccine. This is recommended every year.  Tetanus, diphtheria, and acellular pertussis (Tdap, Td) vaccine. You  may need a Td booster every 10 years.  Zoster vaccine. You may need this after age 67.  Pneumococcal 13-valent conjugate (PCV13) vaccine. One dose is recommended after age 67.  Pneumococcal  polysaccharide (PPSV23) vaccine. One dose is recommended after age 67. Talk to your health care provider about which screenings and vaccines you need and how often you need them. This information is not intended to replace advice given to you by your health care provider. Make sure you discuss any questions you have with your health care provider. Document Released: 09/16/2015 Document Revised: 05/09/2016 Document Reviewed: 06/21/2015 Elsevier Interactive Patient Education  2017 [place] Prevention in the Home Falls can cause injuries. They can happen to people of all ages. There are many things you can do to make your home safe and to help prevent falls. What can I do on the outside of my home?  Regularly fix the edges of walkways and driveways and fix any cracks.  Remove anything that might make you trip as you walk through a door, such as a raised step or threshold.  Trim any bushes or trees on the path to your home.  Use bright outdoor lighting.  Clear any walking paths of anything that might make someone trip, such as rocks or tools.  Regularly check to see if handrails are loose or broken. Make sure that both sides of any steps have handrails.  Any raised decks and porches should have guardrails on the edges.  Have any leaves, snow, or ice cleared regularly.  Use sand or salt on walking paths during winter.  Clean up any spills in your garage right away. This includes oil or grease spills. What can I do in the bathroom?  Use night lights.  Install grab bars by the toilet and in the tub and shower. Do not use towel bars as grab bars.  Use non-skid mats or decals in the tub or shower.  If you need to sit down in the shower, use a plastic, non-slip stool.  Keep the floor dry. Clean up any water that spills on the floor as soon as it happens.  Remove soap buildup in the tub or shower regularly.  Attach bath mats securely with double-sided non-slip rug  tape.  Do not have throw rugs and other things on the floor that can make you trip. What can I do in the bedroom?  Use night lights.  Make sure that you have a light by your bed that is easy to reach.  Do not use any sheets or blankets that are too big for your bed. They should not hang down onto the floor.  Have a firm chair that has side arms. You can use this for support while you get dressed.  Do not have throw rugs and other things on the floor that can make you trip. What can I do in the kitchen?  Clean up any spills right away.  Avoid walking on wet floors.  Keep items that you use a lot in easy-to-reach places.  If you need to reach something above you, use a strong step stool that has a grab bar.  Keep electrical cords out of the way.  Do not use floor polish or wax that makes floors slippery. If you must use wax, use non-skid floor wax.  Do not have throw rugs and other things on the floor that can make you trip. What can I do with my stairs?  Do not leave any items  on the stairs.  Make sure that there are handrails on both sides of the stairs and use them. Fix handrails that are broken or loose. Make sure that handrails are as long as the stairways.  Check any carpeting to make sure that it is firmly attached to the stairs. Fix any carpet that is loose or worn.  Avoid having throw rugs at the top or bottom of the stairs. If you do have throw rugs, attach them to the floor with carpet tape.  Make sure that you have a light switch at the top of the stairs and the bottom of the stairs. If you do not have them, ask someone to add them for you. What else can I do to help prevent falls?  Wear shoes that:  Do not have high heels.  Have rubber bottoms.  Are comfortable and fit you well.  Are closed at the toe. Do not wear sandals.  If you use a stepladder:  Make sure that it is fully opened. Do not climb a closed stepladder.  Make sure that both sides of the  stepladder are locked into place.  Ask someone to hold it for you, if possible.  Clearly mark and make sure that you can see:  Any grab bars or handrails.  First and last steps.  Where the edge of each step is.  Use tools that help you move around (mobility aids) if they are needed. These include:  Canes.  Walkers.  Scooters.  Crutches.  Turn on the lights when you go into a dark area. Replace any light bulbs as soon as they burn out.  Set up your furniture so you have a clear path. Avoid moving your furniture around.  If any of your floors are uneven, fix them.  If there are any pets around you, be aware of where they are.  Review your medicines with your doctor. Some medicines can make you feel dizzy. This can increase your chance of falling. Ask your doctor what other things that you can do to help prevent falls. This information is not intended to replace advice given to you by your health care provider. Make sure you discuss any questions you have with your health care provider. Document Released: 06/16/2009 Document Revised: 01/26/2016 Document Reviewed: 09/24/2014 Elsevier Interactive Patient Education  2017 [employer].

## 2020-12-02 ENCOUNTER — Encounter: Payer: Self-pay | Admitting: Internal Medicine
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
## 2020-12-21 ENCOUNTER — Encounter: Payer: Self-pay | Admitting: Internal Medicine

## 2020-12-21 ENCOUNTER — Ambulatory Visit (INDEPENDENT_AMBULATORY_CARE_PROVIDER_SITE_OTHER): Payer: Medicare Other | Admitting: Internal Medicine

## 2020-12-21 ENCOUNTER — Other Ambulatory Visit: Payer: Self-pay | Admitting: Internal Medicine

## 2020-12-21 ENCOUNTER — Other Ambulatory Visit: Payer: Self-pay

## 2020-12-21 VITALS — BP 110/70 | HR 63 | Temp 97.7°F | Ht 63.5 in | Wt 241.6 lb

## 2020-12-21 DIAGNOSIS — Z0000 Encounter for general adult medical examination without abnormal findings: Secondary | ICD-10-CM | POA: Diagnosis not present

## 2020-12-21 DIAGNOSIS — E785 Hyperlipidemia, unspecified: Secondary | ICD-10-CM

## 2020-12-21 DIAGNOSIS — Z91199 Patient's noncompliance with other medical treatment and regimen due to unspecified reason: Secondary | ICD-10-CM

## 2020-12-21 DIAGNOSIS — Z1231 Encounter for screening mammogram for malignant neoplasm of breast: Secondary | ICD-10-CM

## 2020-12-21 DIAGNOSIS — Z23 Encounter for immunization: Secondary | ICD-10-CM

## 2020-12-21 DIAGNOSIS — E1165 Type 2 diabetes mellitus with hyperglycemia: Secondary | ICD-10-CM

## 2020-12-21 DIAGNOSIS — Z6841 Body Mass Index (BMI) 40.0 and over, adult: Secondary | ICD-10-CM

## 2020-12-21 DIAGNOSIS — Z8379 Family history of other diseases of the digestive system: Secondary | ICD-10-CM

## 2020-12-21 DIAGNOSIS — Z9119 Patient's noncompliance with other medical treatment and regimen: Secondary | ICD-10-CM

## 2020-12-21 DIAGNOSIS — F4323 Adjustment disorder with mixed anxiety and depressed mood: Secondary | ICD-10-CM | POA: Diagnosis not present

## 2020-12-21 DIAGNOSIS — Z780 Asymptomatic menopausal state: Secondary | ICD-10-CM

## 2020-12-21 DIAGNOSIS — Z79899 Other long term (current) drug therapy: Secondary | ICD-10-CM | POA: Diagnosis not present

## 2020-12-21 DIAGNOSIS — F988 Other specified behavioral and emotional disorders with onset usually occurring in childhood and adolescence: Secondary | ICD-10-CM

## 2020-12-21 DIAGNOSIS — Z794 Long term (current) use of insulin: Secondary | ICD-10-CM

## 2020-12-21 MED ORDER — ESCITALOPRAM OXALATE 10 MG PO TABS: 10.0000 mg | ORAL_TABLET | Freq: Every day | ORAL | 1 refills | Status: DC

## 2020-12-21 MED ORDER — ATORVASTATIN CALCIUM 20 MG PO TABS: 1.0000 | ORAL_TABLET | Freq: Every day | ORAL | 3 refills | Status: DC

## 2020-12-21 NOTE — Patient Instructions (Addendum)
Plan getting back on  Atorvastatin .and then get labs  Lipid and celiac antibody  and genetic marker. In about  3  months  Get your mammogram.as planned .   Can restart lexapro  Once a day ( if you eant can takje 5 mg per day to start instead of 10 mg and increase)   Plan  Lab in 3 months   PLan ROV or virtual in 4-6 weeks regarding the [place] Maintenance, Female Adopting a healthy lifestyle and getting preventive care are important in promoting health and wellness. Ask your health care provider about:  The right schedule for you to have regular tests and exams.  Things you can do on your own to prevent diseases and keep yourself healthy. What should I know about diet, weight, and exercise? Eat a healthy diet  Eat a diet that includes plenty of vegetables, fruits, low-fat dairy products, and lean protein.  Do not eat a lot of foods that are high in solid fats, added sugars, or sodium.   Maintain a healthy weight Body mass index (BMI) is used to identify weight problems. It estimates body fat based on height and weight. Your health care provider can help determine your BMI and help you achieve or maintain a healthy weight. Get regular exercise Get regular exercise. This is one of the most important things you can do for your health. Most adults should:  Exercise for at least 150 minutes each week. The exercise should increase your heart rate and make you sweat (moderate-intensity exercise).  Do strengthening exercises at least twice a week. This is in addition to the moderate-intensity exercise.  Spend less time sitting. Even light physical activity can be beneficial. Watch cholesterol and blood lipids Have your blood tested for lipids and cholesterol at 67 years of age, then have this test every 5 years. Have your cholesterol levels checked more often if:  Your lipid or cholesterol levels are high.  You are older than 67 years of age.  You are at high risk for  heart disease. What should I know about cancer screening? Depending on your health history and family history, you may need to have cancer screening at various ages. This may include screening for:  Breast cancer.  Cervical cancer.  Colorectal cancer.  Skin cancer.  Lung cancer. What should I know about heart disease, diabetes, and high blood pressure? Blood pressure and heart disease  High blood pressure causes heart disease and increases the risk of stroke. This is more likely to develop in people who have high blood pressure readings, are of African descent, or are overweight.  Have your blood pressure checked: ? Every 3-5 years if you are 31-62 years of age. ? Every year if you are 32 years old or older. Diabetes Have regular diabetes screenings. This checks your fasting blood sugar level. Have the screening done:  Once every three years after age 61 if you are at a normal weight and have a low risk for diabetes.  More often and at a younger age if you are overweight or have a high risk for diabetes. What should I know about preventing infection? Hepatitis B If you have a higher risk for hepatitis B, you should be screened for this virus. Talk with your health care provider to find out if you are at risk for hepatitis B infection. Hepatitis C Testing is recommended for:  Everyone born from [birth year] through 1967.  Anyone with known risk factors  for hepatitis C. Sexually transmitted infections (STIs)  Get screened for STIs, including gonorrhea and chlamydia, if: ? You are sexually active and are younger than 67 years of age. ? You are older than 67 years of age and your health care provider tells you that you are at risk for this type of infection. ? Your sexual activity has changed since you were last screened, and you are at increased risk for chlamydia or gonorrhea. Ask your health care provider if you are at risk.  Ask your health care provider about whether you are at  high risk for HIV. Your health care provider may recommend a prescription medicine to help prevent HIV infection. If you choose to take medicine to prevent HIV, you should first get tested for HIV. You should then be tested every 3 months for as long as you are taking the medicine. Pregnancy  If you are about to stop having your period (premenopausal) and you may become pregnant, seek counseling before you get pregnant.  Take 400 to 800 micrograms (mcg) of folic acid every day if you become pregnant.  Ask for birth control (contraception) if you want to prevent pregnancy. Osteoporosis and menopause Osteoporosis is a disease in which the bones lose minerals and strength with aging. This can result in bone fractures. If you are 63 years old or older, or if you are at risk for osteoporosis and fractures, ask your health care provider if you should:  Be screened for bone loss.  Take a calcium or vitamin D supplement to lower your risk of fractures.  Be given hormone replacement therapy (HRT) to treat symptoms of menopause. Follow these instructions at home: Lifestyle  Do not use any products that contain nicotine or tobacco, such as cigarettes, e-cigarettes, and chewing tobacco. If you need help quitting, ask your health care provider.  Do not use street drugs.  Do not share needles.  Ask your health care provider for help if you need support or information about quitting drugs. Alcohol use  Do not drink alcohol if: ? Your health care provider tells you not to drink. ? You are pregnant, may be pregnant, or are planning to become pregnant.  If you drink alcohol: ? Limit how much you use to 0-1 drink a day. ? Limit intake if you are breastfeeding.  Be aware of how much alcohol is in your drink. In the [place] one drink equals one 12 oz bottle of beer (355 mL), one 5 oz glass of wine (148 mL), or one 1 oz glass of hard liquor (44 mL). General instructions  Schedule regular health,  dental, and eye exams.  Stay current with your vaccines.  Tell your health care provider if: ? You often feel depressed. ? You have ever been abused or do not feel safe at home. Summary  Adopting a healthy lifestyle and getting preventive care are important in promoting health and wellness.  Follow your health care provider's instructions about healthy diet, exercising, and getting tested or screened for diseases.  Follow your health care provider's instructions on monitoring your cholesterol and blood pressure. This information is not intended to replace advice given to you by your health care provider. Make sure you discuss any questions you have with your health care provider. Document Revised: 08/13/2018 Document Reviewed: 08/13/2018 Elsevier Patient Education  2021 [employer].

## 2021-01-06 ENCOUNTER — Other Ambulatory Visit: Payer: Medicare Other

## 2021-01-06 ENCOUNTER — Ambulatory Visit (INDEPENDENT_AMBULATORY_CARE_PROVIDER_SITE_OTHER): Payer: Medicare Other | Admitting: Gastroenterology

## 2021-01-06 ENCOUNTER — Encounter: Payer: Self-pay | Admitting: Gastroenterology

## 2021-01-06 VITALS — BP 110/70 | HR 66 | Ht 63.5 in | Wt 240.0 lb

## 2021-01-06 DIAGNOSIS — K219 Gastro-esophageal reflux disease without esophagitis: Secondary | ICD-10-CM

## 2021-01-06 DIAGNOSIS — K3184 Gastroparesis: Secondary | ICD-10-CM

## 2021-01-06 DIAGNOSIS — D3A8 Other benign neuroendocrine tumors: Secondary | ICD-10-CM

## 2021-01-06 NOTE — Patient Instructions (Signed)
Your provider has requested that you go to the basement level for lab work before leaving today. Press "B" on the elevator. The lab is located at the first door on the left as you exit the elevator.  If you are age 67 or older, your body mass index should be between 23-30. Your Body mass index is 41.85 kg/m. If this is out of the aforementioned range listed, please consider follow up with your Primary Care Provider.  If you are age 66 or younger, your body mass index should be between 19-25. Your Body mass index is 41.85 kg/m. If this is out of the aformentioned range listed, please consider follow up with your Primary Care Provider.    We will send message to Dr Rush Landmark for your recall EUS/EGD for 03/2021, his nurse will contact you to schedule  Due to recent changes in healthcare laws, you may see the results of your imaging and laboratory studies on MyChart before your provider has had a chance to review them.  We understand that in some cases there may be results that are confusing or concerning to you. Not all laboratory results come back in the same time frame and the provider may be waiting for multiple results in order to interpret others.  Please give [place] 48 hours in order for your provider to thoroughly review all the results before contacting the office for clarification of your results.   I appreciate the  opportunity to care for you  Thank You   Harl Bowie , MD
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
## 2021-01-10 ENCOUNTER — Other Ambulatory Visit: Payer: Self-pay | Admitting: *Deleted

## 2021-01-10 ENCOUNTER — Telehealth: Payer: Self-pay | Admitting: *Deleted

## 2021-01-10 DIAGNOSIS — D3A010 Benign carcinoid tumor of the duodenum: Secondary | ICD-10-CM

## 2021-01-10 DIAGNOSIS — D132 Benign neoplasm of duodenum: Secondary | ICD-10-CM

## 2021-01-10 DIAGNOSIS — R1310 Dysphagia, unspecified: Secondary | ICD-10-CM

## 2021-01-10 LAB — TISSUE TRANSGLUTAMINASE ABS,IGG,IGA
Tissue Transglut Ab: <2 U/mL (ref 0–5)
Transglutaminase IgA: <2 U/mL (ref 0–3)

## 2021-01-10 LAB — CBC WITH DIFFERENTIAL/PLATELET
Basophils Absolute: 0 10*3/uL (ref 0.0–0.2)
Basos: 1 %
EOS (ABSOLUTE): 0.3 10*3/uL (ref 0.0–0.4)
Eos: 4 %
Hematocrit: 41.7 % (ref 34.0–46.6)
Hemoglobin: 13.8 g/dL (ref 11.1–15.9)
Immature Grans (Abs): 0 10*3/uL (ref 0.0–0.1)
Immature Granulocytes: 0 %
Lymphocytes Absolute: 1.5 10*3/uL (ref 0.7–3.1)
Lymphs: 24 %
MCH: 28.1 pg (ref 26.6–33.0)
MCHC: 33.1 g/dL (ref 31.5–35.7)
MCV: 85 fL (ref 79–97)
Monocytes Absolute: 0.5 10*3/uL (ref 0.1–0.9)
Monocytes: 9 %
Neutrophils Absolute: 3.8 10*3/uL (ref 1.4–7.0)
Neutrophils: 62 %
Platelets: 191 10*3/uL (ref 150–450)
RBC: 4.91 x10E6/uL (ref 3.77–5.28)
RDW: 13.1 % (ref 11.7–15.4)
WBC: 6.1 10*3/uL (ref 3.4–10.8)

## 2021-01-10 LAB — COMPREHENSIVE METABOLIC PANEL
ALT: 20 IU/L (ref 0–32)
AST: 18 IU/L (ref 0–40)
Albumin/Globulin Ratio: 1.7 (ref 1.2–2.2)
Albumin: 3.8 g/dL (ref 3.8–4.8)
Alkaline Phosphatase: 78 IU/L (ref 44–121)
BUN/Creatinine Ratio: 21 (ref 12–28)
BUN: 19 mg/dL (ref 8–27)
Bilirubin Total: 0.3 mg/dL (ref 0.0–1.2)
CO2: 24 mmol/L (ref 20–29)
Calcium: 9.2 mg/dL (ref 8.7–10.3)
Chloride: 105 mmol/L (ref 96–106)
Creatinine, Ser: 0.92 mg/dL (ref 0.57–1.00)
Globulin, Total: 2.3 g/dL (ref 1.5–4.5)
Glucose: 120 mg/dL — ABNORMAL HIGH (ref 65–99)
Potassium: 4.2 mmol/L (ref 3.5–5.2)
Sodium: 143 mmol/L (ref 134–144)
Total Protein: 6.1 g/dL (ref 6.0–8.5)
eGFR: 69 mL/min/{1.73_m2} (ref >59)

## 2021-01-10 LAB — CHROMOGRANIN A: Chromogranin A (ng/mL): 293.8 ng/mL — ABNORMAL HIGH (ref 0.0–101.8)

## 2021-01-10 LAB — HIGH SENSITIVITY CRP: CRP, High Sensitivity: 13.5 mg/L — ABNORMAL HIGH (ref 0.00–3.00)

## 2021-01-10 LAB — SEDIMENTATION RATE: Sed Rate: 21 mm/hr (ref 0–40)

## 2021-01-10 LAB — IGA: IgA/Immunoglobulin A, Serum: 317 mg/dL (ref 87–352)

## 2021-01-10 MED ORDER — METOCLOPRAMIDE HCL 10 MG PO TABS: 10.0000 mg | ORAL_TABLET | Freq: Three times a day (TID) | ORAL | 0 refills | Status: DC | PRN

## 2021-01-10 NOTE — Telephone Encounter (Signed)
Nevermind Paige Hernandez had them put it at 10:45am

## 2021-01-10 NOTE — Telephone Encounter (Signed)
Thanks for update. GM 

## 2021-01-10 NOTE — Telephone Encounter (Signed)
From: MansouratyTelford Nab., MD Sent: 01/10/2021   1:07 PM EDT To: Oda Kilts, CMA, Mauri Pole, MD  Certainly happy to help. Please schedule next available with me. I will have openings on 5/31 starting at 1030 at [facility] (this is now on hold for my patients), if she desires this.  1 hour total EGD/EUS.  Thanks. GM ----- Message ----- From: Oda Kilts, CMA Sent: 01/10/2021  11:35 AM EDT To: Irving Copas., MD   ----- Message ----- From: Mauri Pole, MD Sent: 01/06/2021   1:33 PM EDT To: Oda Kilts, CMA, *  Hi Gabe,  She is due for surveillance EUS for duodenal carcinoid, she is having intermittent dysphagia. Will you be willing to do esophageal dilation if needed.  She has h/o gastroparesis, will need Reglan and prolonged fasting prior to procedure.  Thanks VN   I scheduled this patient for an Upper EUS and a EGD on 5/31 but endo said they could only do 1:45 spot. Will wait on Dr Jaci Lazier approval on this before I contact the pt

## 2021-01-10 NOTE — Telephone Encounter (Signed)
Patient aware of date and time of procedure at [facility]. With Dr Rush Landmark will receive her instructions in My Chart

## 2021-01-10 NOTE — Telephone Encounter (Signed)
Sent in 5 Reglan tablets for pt as directed for her before procedure

## 2021-01-10 NOTE — Telephone Encounter (Signed)
Please send Reglan 10mg  tablet to take every 8 hours day before and in the am of the procedure X 5 tabs with no refills. Thanks

## 2021-01-13 ENCOUNTER — Telehealth: Payer: Self-pay | Admitting: Internal Medicine

## 2021-01-13 NOTE — Telephone Encounter (Signed)
Insulin Pen Needle (NOVOFINE PLUS) 32G X 4 MM MISC  insulin glargine, 1 Unit Dial, (TOUJEO SOLOSTAR) 300 UNIT/ML Solostar Pen Pt is needing a refill on both of these medications. Pt has some questions about these Toujeo.    [place], [place] would like a call back as soon as possible

## 2021-01-17 NOTE — Telephone Encounter (Signed)
Pt calling to F/u on medication refill.

## 2021-01-17 NOTE — Telephone Encounter (Signed)
I called and spoke with the pt and she has been rescheduled and re instructed to 5/23 at 730 am.  New information was sent to the pt via My Chart as well.

## 2021-01-17 NOTE — Telephone Encounter (Signed)
Inbound call from patient stating she received a reminder call for her to have her procedure tomorrow but she was under the impression that it was for 01/31/21 so she did not have her covid test.  Please advise.

## 2021-01-17 NOTE — Telephone Encounter (Signed)
Spoke with patient today and she cant do the EUS tomorrow, she has an appointment with daughter tomorrow, explained to her that Chong Sicilian would call her sometime today to reschedule

## 2021-01-18 ENCOUNTER — Other Ambulatory Visit: Payer: Self-pay

## 2021-01-19 ENCOUNTER — Other Ambulatory Visit: Payer: Self-pay | Admitting: *Deleted

## 2021-01-19 DIAGNOSIS — E1165 Type 2 diabetes mellitus with hyperglycemia: Secondary | ICD-10-CM

## 2021-01-19 MED ORDER — INSULIN PEN NEEDLE 32G X 4 MM MISC: 3 refills | Status: DC

## 2021-01-19 MED ORDER — TOUJEO SOLOSTAR 300 UNIT/ML ~~LOC~~ SOPN: 50.0000 [IU] | PEN_INJECTOR | Freq: Every day | SUBCUTANEOUS | 3 refills | Status: DC

## 2021-01-19 NOTE — Telephone Encounter (Signed)
Rx sent 

## 2021-01-22 NOTE — Anesthesia Preprocedure Evaluation (Addendum)
Anesthesia Evaluation  Patient identified by MRN, date of birth, ID band Patient awake    Reviewed: Allergy & Precautions, NPO status , Patient's Chart, lab work & pertinent test results  History of Anesthesia Complications Negative for: history of anesthetic complications  Airway Mallampati: II  TM Distance: >3 FB Neck ROM: Full    Dental  (+) Dental Advisory Given, Teeth Intact   Pulmonary neg pulmonary ROS,    Pulmonary exam normal        Cardiovascular negative cardio ROS Normal cardiovascular exam     Neuro/Psych  Headaches, PSYCHIATRIC DISORDERS Anxiety    GI/Hepatic Neg liver ROS, hiatal hernia, GERD  Medicated and Controlled, Duodenal carcinoid s/p excision     Endo/Other  diabetes, Type 2, Insulin DependentMorbid obesity  Renal/GU negative Renal ROS     Musculoskeletal  (+) Arthritis ,   Abdominal (+) + obese,   Peds  (+) ATTENTION DEFICIT DISORDER WITHOUT HYPERACTIVITY Hematology negative hematology ROS (+)   Anesthesia Other Findings   Reproductive/Obstetrics                            Anesthesia Physical Anesthesia Plan  ASA: III  Anesthesia Plan: MAC   Post-op Pain Management:    Induction: Intravenous  PONV Risk Score and Plan: 2 and Propofol infusion and Treatment may vary due to age or medical condition  Airway Management Planned: Nasal Cannula and Natural Airway  Additional Equipment: None  Intra-op Plan:   Post-operative Plan:   Informed Consent: I have reviewed the patients History and Physical, chart, labs and discussed the procedure including the risks, benefits and alternatives for the proposed anesthesia with the patient or authorized representative who has indicated his/her understanding and acceptance.       Plan Discussed with: CRNA and Anesthesiologist  Anesthesia Plan Comments:        Anesthesia Quick Evaluation

## 2021-01-23 ENCOUNTER — Ambulatory Visit (HOSPITAL_COMMUNITY): Payer: Medicare Other | Admitting: Anesthesiology

## 2021-01-23 ENCOUNTER — Encounter (HOSPITAL_COMMUNITY)
Admission: RE | Disposition: A | Discharge status: Home / Self Care | Payer: Self-pay | Source: Home / Self Care | Attending: Gastroenterology

## 2021-01-23 ENCOUNTER — Encounter (HOSPITAL_COMMUNITY): Payer: Self-pay | Admitting: Gastroenterology

## 2021-01-23 ENCOUNTER — Ambulatory Visit (HOSPITAL_COMMUNITY)
Admission: RE | Admit: 2021-01-23 | Discharge: 2021-01-23 | Disposition: A | Discharge status: Home / Self Care | Payer: Medicare Other | Attending: Gastroenterology | Admitting: Gastroenterology

## 2021-01-23 ENCOUNTER — Other Ambulatory Visit: Payer: Self-pay

## 2021-01-23 ENCOUNTER — Telehealth: Payer: Self-pay | Admitting: Internal Medicine

## 2021-01-23 DIAGNOSIS — K3189 Other diseases of stomach and duodenum: Secondary | ICD-10-CM | POA: Insufficient documentation

## 2021-01-23 DIAGNOSIS — K3184 Gastroparesis: Secondary | ICD-10-CM | POA: Diagnosis not present

## 2021-01-23 DIAGNOSIS — Z09 Encounter for follow-up examination after completed treatment for conditions other than malignant neoplasm: Secondary | ICD-10-CM | POA: Diagnosis not present

## 2021-01-23 DIAGNOSIS — K2289 Other specified disease of esophagus: Secondary | ICD-10-CM | POA: Diagnosis not present

## 2021-01-23 DIAGNOSIS — Z8616 Personal history of COVID-19: Secondary | ICD-10-CM | POA: Insufficient documentation

## 2021-01-23 DIAGNOSIS — Z79899 Other long term (current) drug therapy: Secondary | ICD-10-CM | POA: Insufficient documentation

## 2021-01-23 DIAGNOSIS — K580 Irritable bowel syndrome with diarrhea: Secondary | ICD-10-CM | POA: Diagnosis not present

## 2021-01-23 DIAGNOSIS — Z85060 Personal history of malignant carcinoid tumor of small intestine: Secondary | ICD-10-CM | POA: Insufficient documentation

## 2021-01-23 DIAGNOSIS — E1143 Type 2 diabetes mellitus with diabetic autonomic (poly)neuropathy: Secondary | ICD-10-CM | POA: Diagnosis not present

## 2021-01-23 DIAGNOSIS — Z794 Long term (current) use of insulin: Secondary | ICD-10-CM | POA: Diagnosis not present

## 2021-01-23 DIAGNOSIS — K649 Unspecified hemorrhoids: Secondary | ICD-10-CM | POA: Diagnosis not present

## 2021-01-23 DIAGNOSIS — K219 Gastro-esophageal reflux disease without esophagitis: Secondary | ICD-10-CM | POA: Diagnosis not present

## 2021-01-23 DIAGNOSIS — R12 Heartburn: Secondary | ICD-10-CM | POA: Diagnosis not present

## 2021-01-23 DIAGNOSIS — D509 Iron deficiency anemia, unspecified: Secondary | ICD-10-CM | POA: Insufficient documentation

## 2021-01-23 DIAGNOSIS — Z8379 Family history of other diseases of the digestive system: Secondary | ICD-10-CM | POA: Diagnosis not present

## 2021-01-23 DIAGNOSIS — R1310 Dysphagia, unspecified: Secondary | ICD-10-CM | POA: Insufficient documentation

## 2021-01-23 DIAGNOSIS — Z888 Allergy status to other drugs, medicaments and biological substances status: Secondary | ICD-10-CM | POA: Diagnosis not present

## 2021-01-23 DIAGNOSIS — K449 Diaphragmatic hernia without obstruction or gangrene: Secondary | ICD-10-CM | POA: Insufficient documentation

## 2021-01-23 HISTORY — PX: SAVORY DILATION: SHX5439

## 2021-01-23 HISTORY — PX: ESOPHAGOGASTRODUODENOSCOPY (EGD) WITH PROPOFOL: SHX5813

## 2021-01-23 HISTORY — PX: EUS: SHX5427

## 2021-01-23 HISTORY — PX: BIOPSY: SHX5522

## 2021-01-23 LAB — GLUCOSE, CAPILLARY: Glucose-Capillary: 125 mg/dL — ABNORMAL HIGH (ref 70–99)

## 2021-01-23 SURGERY — ESOPHAGOGASTRODUODENOSCOPY (EGD) WITH PROPOFOL
Anesthesia: Monitor Anesthesia Care

## 2021-01-23 MED ORDER — PROPOFOL 500 MG/50ML IV EMUL
INTRAVENOUS | Status: DC | PRN
  Administered 2021-01-23: 200 ug/kg/min via INTRAVENOUS

## 2021-01-23 MED ORDER — ONDANSETRON HCL 4 MG/2ML IJ SOLN
INTRAMUSCULAR | Status: DC | PRN
  Administered 2021-01-23: 4 mg via INTRAVENOUS

## 2021-01-23 MED ORDER — GLYCOPYRROLATE 0.2 MG/ML IJ SOLN
INTRAMUSCULAR | Status: DC | PRN
  Administered 2021-01-23: .1 mg via INTRAVENOUS

## 2021-01-23 MED ORDER — LACTATED RINGERS IV SOLN: INTRAVENOUS | Status: DC

## 2021-01-23 MED ORDER — PHENYLEPHRINE HCL (PRESSORS) 10 MG/ML IV SOLN
INTRAVENOUS | Status: DC | PRN
  Administered 2021-01-23: 120 ug via INTRAVENOUS

## 2021-01-23 MED ORDER — LIDOCAINE HCL (CARDIAC) PF 100 MG/5ML IV SOSY
PREFILLED_SYRINGE | INTRAVENOUS | Status: DC | PRN
  Administered 2021-01-23: 75 mg via INTRAVENOUS

## 2021-01-23 MED ORDER — LACTATED RINGERS IV SOLN: INTRAVENOUS | Status: DC | PRN

## 2021-01-23 SURGICAL SUPPLY — 15 items

## 2021-01-23 NOTE — Transfer of Care (Signed)
Immediate Anesthesia Transfer of Care Note  Patient: Frederik Schmidt Felty  Procedure(s) Performed: ESOPHAGOGASTRODUODENOSCOPY (EGD) WITH PROPOFOL (N/A ) UPPER ENDOSCOPIC ULTRASOUND (EUS) RADIAL (N/A ) SAVORY DILATION (N/A ) BIOPSY  Patient Location: PACU  Anesthesia Type:MAC  Level of Consciousness: awake, alert , oriented and patient cooperative  Airway & Oxygen Therapy: Patient Spontanous Breathing and Patient connected to face mask oxygen  Post-op Assessment: Report given to RN, Post -op Vital signs reviewed and stable and Patient moving all extremities X 4  Post vital signs: stable  Last Vitals:  Vitals Value Taken Time  BP 95/50 01/23/21 0830  Temp    Pulse 75 01/23/21 0832  Resp 21 01/23/21 0832  SpO2 100 % 01/23/21 0832  Vitals shown include unvalidated device data.  Last Pain:  Vitals:   01/23/21 0829  TempSrc:   PainSc: 0-No pain         Complications: No complications documented.

## 2021-01-23 NOTE — Op Note (Signed)
[facility] Patient Name: Paige Hernandez Procedure Date: 01/23/2021 MRN: [id number] Attending MD: Justice Britain , MD Date of Birth: Jan 07, 1956 CSN: [id number] Age: 65 Admit Type: Outpatient Procedure:                Upper EUS Indications:              Dysphagia, Heartburn, Exclusion of celiac disease                            due to Family history of Celiac in Daughter,                            Follow-up of esophagitis, Neuroendocrine Tumor Providers:                Justice Britain, MD, Kary Kos RN, RN,                            Laverda Sorenson, Technician, Enrigue Catena, CRNA Referring MD:             Mauri Pole, MD, Standley Brooking. Panosh Medicines:                Monitored Anesthesia Care Complications:            No immediate complications. Estimated Blood Loss:     Estimated blood loss was minimal. Procedure:                Pre-Anesthesia Assessment:                           - Prior to the procedure, a History and Physical                            was performed, and patient medications and                            allergies were reviewed. The patient's tolerance of                            previous anesthesia was also reviewed. The risks                            and benefits of the procedure and the sedation                            options and risks were discussed with the patient.                            All questions were answered, and informed consent                            was obtained. Prior Anticoagulants: The patient has                            taken no previous anticoagulant or antiplatelet  agents except for NSAID medication. ASA Grade                            Assessment: III - A patient with severe systemic                            disease. After reviewing the risks and benefits,                            the patient was deemed in satisfactory condition to                            undergo  the procedure.                           After obtaining informed consent, the endoscope was                            passed under direct vision. Throughout the                            procedure, the patient's blood pressure, pulse, and                            oxygen saturations were monitored continuously. The                            GIF-H190 ([id number] Olympus gastroscope was                            introduced through the mouth, and advanced to the                            second part of duodenum. The was introduced through                            the mouth, and advanced to the duodenum for                            ultrasound examination from the stomach and                            duodenum. The upper EUS was accomplished without                            difficulty. The patient tolerated the procedure. Scope In: Scope Out: Findings:      ENDOSCOPIC FINDING: :      No gross lesions were noted in the entire esophagus - previous       esophagitis that has been noted has healed. Biopsies were taken with a       cold forceps for histology to rule out EoE/LoE. After the rest of the       procedure was complete, a guidewire was placed and the scope was  withdrawn. Dilation was performed with a Savary dilator with mild       resistance at 18 mm. The dilation site was examined following endoscope       reinsertion and showed mild mucosal disruption and mild improvement in       luminal narrowing just below the UES, and no perforation.      The Z-line was irregular and was found 36 cm from the incisors.      A 2 cm hiatal hernia was present.      Normal mucosa was found in the entire examined stomach.      A small post mucosectomy scar was found in the duodenal bulb. The scar       was unremarkable in appearance. Biopsies were taken with a cold forceps       for histology.      No other gross lesions were noted in the duodenal bulb, in the first       portion of  the duodenum and in the second portion of the duodenum.       Biopsies were taken with a cold forceps for histology.      ENDOSONOGRAPHIC FINDING: :      Endosonographic imaging in the duodenal bulb and in the second portion       of the duodenum showed no extrinsic compression, intramural       (subepithelial) lesion, mass, varices or wall thickening.      No malignant-appearing lymph nodes were visualized in the left gastric       region (level 17), gastrohepatic ligament (level 18), celiac region       (level 20), perigastric region and peripancreatic region.      Endosonographic imaging in the visualized portion of the liver showed no       mass.      The celiac region was visualized. Impression:               EGD Impression:                           - No gross lesions in esophagus. Biopsied. Dilated.                           - Z-line irregular, 36 cm from the incisors.                           - 2 cm hiatal hernia.                           - Normal mucosa was found in the entire stomach.                           - Duodenal scar. Biopsied.                           - No other gross lesions in the duodenal bulb, in                            the first portion of the duodenum and in the second  portion of the duodenum. Biopsied.                           EUS Impression:                           - Endosonographic imaging in the duodenal bulb and                            in the second portion of the duodenum showed no                            extrinsic compression, intramural (subepithelial)                            lesion, mass, varices or wall thickening.                           - No malignant-appearing lymph nodes were                            visualized in the left gastric region (level 17),                            gastrohepatic ligament (level 18), celiac region                            (level 20), perigastric region and peripancreatic                             region. Moderate Sedation:      Not Applicable - Patient had care per Anesthesia. Recommendation:           - The patient will be observed post-procedure,                            until all discharge criteria are met.                           - Discharge patient to home.                           - Patient has a contact number available for                            emergencies. The signs and symptoms of potential                            delayed complications were discussed with the                            patient. Return to normal activities tomorrow.                            Written discharge instructions were provided to the  patient.                           - Dilation diet as per protocol. Clear liquids x 3                            hours and then advance to soft diet. Advance diet                            as tolerated thereafter.                           - Please use Cepacol or Halls Lozenges +/-                            Chloraseptic spray for next 72-96 hours to aid in                            sore thoat should you experience this.                           - Continue present medications.                           - Minimize NSAIDs as able.                           - Monitor for signs/symptoms of bleeding,                            perforation, and infection. If issues please call                            our number to get further assistance as needed.                           - Observe patient's clinical course.                           - Await path results.                           - If all biopsies are unremarkable, then would                            consider repeat DOTATATE NETSPT in 2023 and if                            negative then repeat EUS in 2024. At that point, if                            no evidence of recurrence of Duodenal NET, then                            likelihood of being  able to stop surveillance can                            be had after discussion with patient. Pathology                            will dictate this however.                           - The findings and recommendations were discussed                            with the patient.                           - The findings and recommendations were discussed                            with the designated responsible adult. Procedure Code(s):        --- Professional ---                           [id number], Esophagogastroduodenoscopy, flexible,                            transoral; with endoscopic ultrasound examination                            limited to the esophagus, stomach or duodenum, and                            adjacent structures                           43248, Esophagogastroduodenoscopy, flexible,                            transoral; with insertion of guide wire followed by                            passage of dilator(s) through esophagus over guide                            wire Diagnosis Code(s):        --- Professional ---                           K22.8, Other specified diseases of esophagus                           K44.9, Diaphragmatic hernia without obstruction or                            gangrene                           K31.89, Other diseases of stomach and duodenum  I89.9, Noninfective disorder of lymphatic vessels                            and lymph nodes, unspecified                           R13.10, Dysphagia, unspecified                           R12, Heartburn                           K20.90, Esophagitis, unspecified without bleeding CPT copyright 2019 [employer]. All rights reserved. The codes documented in this report are preliminary and upon coder review may  be revised to meet current compliance requirements. Justice Britain, MD 01/23/2021 8:35:01 AM Number of Addenda: 0

## 2021-01-23 NOTE — Interval H&P Note (Signed)
History and Physical Interval Note:  01/23/2021 7:36 AM  Paige Hernandez  has presented today for surgery, with the diagnosis of Duodeanl carcinoid   dysphagia.  The various methods of treatment have been discussed with the patient and family. After consideration of risks, benefits and other options for treatment, the patient has consented to  Procedure(s): ESOPHAGOGASTRODUODENOSCOPY (EGD) WITH PROPOFOL (N/A) UPPER ENDOSCOPIC ULTRASOUND (EUS) RADIAL (N/A) SAVORY DILATION (N/A) as a surgical intervention.  The patient's history has been reviewed, patient examined, no change in status, stable for surgery.  I have reviewed the patient's chart and labs.  Questions were answered to the patient's satisfaction.     [employer]

## 2021-01-23 NOTE — Telephone Encounter (Signed)
Bone density orders have been faxed to [place] [phone]

## 2021-01-23 NOTE — Addendum Note (Signed)
Addendum  created 01/23/21 1401 by Lissa Morales, CRNA   Charge Capture section accepted

## 2021-01-23 NOTE — Telephone Encounter (Signed)
Paige Hernandez from [place] needs a Bone Density order for this patient that has an appointment tomorrow. She didn't receive the reason for the order and needs that on the order also. She needs the order faxed to [phone]. The order can be placed by another provider since Dr. Regis Bill is out of the office.  [facility] [phone] Fax [phone]

## 2021-01-23 NOTE — Anesthesia Procedure Notes (Signed)
Procedure Name: MAC Date/Time: 01/23/2021 7:39 AM Performed by: Lissa Morales, CRNA Pre-anesthesia Checklist: Patient identified, Emergency Drugs available, Patient being monitored, Timeout performed and Suction available Patient Re-evaluated:Patient Re-evaluated prior to induction Oxygen Delivery Method: Simple face mask Placement Confirmation: positive ETCO2

## 2021-01-23 NOTE — Discharge Instructions (Signed)
YOU HAD AN ENDOSCOPIC PROCEDURE TODAY: Refer to the procedure report and other information in the discharge instructions given to you for any specific questions about what was found during the examination. If this information does not answer your questions, please call [place] office at [phone] to clarify.   YOU SHOULD EXPECT: Some feelings of bloating in the abdomen. Passage of more gas than usual. Walking can help get rid of the air that was put into your GI tract during the procedure and reduce the bloating. If you had a lower endoscopy (such as a colonoscopy or flexible sigmoidoscopy) you may notice spotting of blood in your stool or on the toilet paper. Some abdominal soreness may be present for a day or two, also.  DIET: Your first meal following the procedure should be a light meal and then it is ok to progress to your normal diet. A half-sandwich or bowl of soup is an example of a good first meal. Heavy or fried foods are harder to digest and may make you feel nauseous or bloated. Drink plenty of fluids but you should avoid alcoholic beverages for 24 hours. If you had a esophageal dilation, please see attached instructions for diet.    ACTIVITY: Your care partner should take you home directly after the procedure. You should plan to take it easy, moving slowly for the rest of the day. You can resume normal activity the day after the procedure however YOU SHOULD NOT DRIVE, use power tools, machinery or perform tasks that involve climbing or major physical exertion for 24 hours (because of the sedation medicines used during the test).   SYMPTOMS TO REPORT IMMEDIATELY: A gastroenterologist can be reached at any hour. Please call [phone]  for any of the following symptoms:   Following upper endoscopy (EGD, EUS, ERCP, esophageal dilation) Vomiting of blood or coffee ground material  New, significant abdominal pain  New, significant chest pain or pain under the shoulder blades  Painful or  persistently difficult swallowing  New shortness of breath  Black, tarry-looking or red, bloody stools  FOLLOW UP:  If any biopsies were taken you will be contacted by phone or by letter within the next 1-3 weeks. Call [phone]  if you have not heard about the biopsies in 3 weeks.  Please also call with any specific questions about appointments or follow up tests.  

## 2021-01-23 NOTE — Anesthesia Postprocedure Evaluation (Signed)
Anesthesia Post Note  Patient: [name]  Procedure(s) Performed: ESOPHAGOGASTRODUODENOSCOPY (EGD) WITH PROPOFOL (N/A ) UPPER ENDOSCOPIC ULTRASOUND (EUS) RADIAL (N/A ) SAVORY DILATION (N/A ) BIOPSY     Patient location during evaluation: PACU Anesthesia Type: MAC Level of consciousness: awake and alert Pain management: pain level controlled Vital Signs Assessment: post-procedure vital signs reviewed and stable Respiratory status: spontaneous breathing, nonlabored ventilation and respiratory function stable Cardiovascular status: stable and blood pressure returned to baseline Anesthetic complications: no   No complications documented.  Last Vitals:  Vitals:   01/23/21 0840 01/23/21 0850  BP: (!) 104/57 94/69  Pulse: 68 60  Resp: 18 16  Temp:    SpO2: 100% 100%    Last Pain:  Vitals:   01/23/21 0850  TempSrc:   PainSc: 0-No pain                 Paige Hernandez

## 2021-01-24 LAB — SURGICAL PATHOLOGY

## 2021-01-24 LAB — HM DEXA SCAN

## 2021-01-25 ENCOUNTER — Encounter (HOSPITAL_COMMUNITY): Payer: Self-pay | Admitting: Gastroenterology

## 2021-01-26 ENCOUNTER — Encounter: Payer: Self-pay | Admitting: Gastroenterology

## 2021-01-27 ENCOUNTER — Telehealth: Payer: Self-pay | Admitting: Dietician

## 2021-01-27 ENCOUNTER — Other Ambulatory Visit: Payer: Self-pay

## 2021-01-27 ENCOUNTER — Ambulatory Visit (INDEPENDENT_AMBULATORY_CARE_PROVIDER_SITE_OTHER): Payer: Medicare Other | Admitting: Internal Medicine

## 2021-01-27 ENCOUNTER — Other Ambulatory Visit (HOSPITAL_COMMUNITY): Payer: Medicare Other

## 2021-01-27 ENCOUNTER — Encounter: Payer: Self-pay | Admitting: Internal Medicine

## 2021-01-27 VITALS — BP 112/72 | HR 70 | Ht 63.5 in | Wt 244.0 lb

## 2021-01-27 DIAGNOSIS — E785 Hyperlipidemia, unspecified: Secondary | ICD-10-CM

## 2021-01-27 DIAGNOSIS — Z794 Long term (current) use of insulin: Secondary | ICD-10-CM | POA: Diagnosis not present

## 2021-01-27 DIAGNOSIS — Z6841 Body Mass Index (BMI) 40.0 and over, adult: Secondary | ICD-10-CM

## 2021-01-27 DIAGNOSIS — E1165 Type 2 diabetes mellitus with hyperglycemia: Secondary | ICD-10-CM | POA: Diagnosis not present

## 2021-01-27 LAB — POCT GLYCOSYLATED HEMOGLOBIN (HGB A1C): Hemoglobin A1C: 6.7 % — AB (ref 4.0–5.6)

## 2021-01-27 MED ORDER — LYUMJEV KWIKPEN 200 UNIT/ML ~~LOC~~ SOPN: PEN_INJECTOR | SUBCUTANEOUS | 3 refills | Status: DC
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
## 2021-01-27 NOTE — Telephone Encounter (Signed)
Returned patient call. Patient was concerned that her new dexcom would not be able to be downloaded at her MD appointment today.  Discussed that the download has to do with her name being entered rather than the Dexcom.  Download was completed with significant improvement.    Patient to call for further questions.  Antonieta Iba, RD, LDN, CDCES

## 2021-01-27 NOTE — Patient Instructions (Addendum)
Please continue: - Invokana 300 mg in am - Toujeo 50 units at bedtime  Use: - Lyumjev 18-22 units at the start of each meal   Please come back for a follow-up appointment in 3-4 months.

## 2021-01-27 NOTE — Addendum Note (Signed)
Addended by: Jacqualin Combes on: 01/27/2021 02:19 PM   Modules accepted: Orders

## 2021-01-31 ENCOUNTER — Encounter: Payer: Self-pay | Admitting: Internal Medicine

## 2021-01-31 ENCOUNTER — Telehealth: Payer: Medicare Other | Admitting: Internal Medicine

## 2021-03-08 ENCOUNTER — Telehealth: Payer: Self-pay | Admitting: Internal Medicine

## 2021-03-08 NOTE — Telephone Encounter (Signed)
error 

## 2021-03-13 ENCOUNTER — Ambulatory Visit: Payer: Medicare Other | Admitting: Internal Medicine

## 2021-03-22 ENCOUNTER — Other Ambulatory Visit: Payer: Medicare Other

## 2021-05-01 ENCOUNTER — Other Ambulatory Visit: Payer: Medicare Other

## 2021-05-05 ENCOUNTER — Encounter: Payer: Self-pay | Admitting: Internal Medicine

## 2021-05-05 ENCOUNTER — Other Ambulatory Visit: Payer: Self-pay

## 2021-05-05 ENCOUNTER — Ambulatory Visit (INDEPENDENT_AMBULATORY_CARE_PROVIDER_SITE_OTHER): Payer: Medicare Other | Admitting: Internal Medicine

## 2021-05-05 VITALS — BP 138/82 | HR 88 | Ht 63.5 in | Wt 240.8 lb

## 2021-05-05 DIAGNOSIS — Z6841 Body Mass Index (BMI) 40.0 and over, adult: Secondary | ICD-10-CM

## 2021-05-05 DIAGNOSIS — E1165 Type 2 diabetes mellitus with hyperglycemia: Secondary | ICD-10-CM

## 2021-05-05 DIAGNOSIS — Z794 Long term (current) use of insulin: Secondary | ICD-10-CM | POA: Diagnosis not present

## 2021-05-05 DIAGNOSIS — E785 Hyperlipidemia, unspecified: Secondary | ICD-10-CM

## 2021-05-05 LAB — POCT GLYCOSYLATED HEMOGLOBIN (HGB A1C): Hemoglobin A1C: 6.7 % — AB (ref 4.0–5.6)

## 2021-05-05 MED ORDER — TOUJEO SOLOSTAR 300 UNIT/ML ~~LOC~~ SOPN: 54.0000 [IU] | PEN_INJECTOR | Freq: Every day | SUBCUTANEOUS | 3 refills | Status: DC

## 2021-05-05 MED ORDER — LYUMJEV KWIKPEN 200 UNIT/ML ~~LOC~~ SOPN: PEN_INJECTOR | SUBCUTANEOUS | 3 refills | Status: DC
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
## 2021-05-05 NOTE — Patient Instructions (Addendum)
Please continue: - Invokana 300 mg in am - Lyumjev 18-22 units at the start of each meal   Please increase: - Toujeo 54-56 units at bedtime  Please come back for a follow-up appointment in 3-4 months.

## 2021-05-23 ENCOUNTER — Encounter: Payer: Self-pay | Admitting: Internal Medicine

## 2021-06-29 ENCOUNTER — Telehealth: Payer: Self-pay | Admitting: Internal Medicine

## 2021-06-29 DIAGNOSIS — Z79899 Other long term (current) drug therapy: Secondary | ICD-10-CM

## 2021-06-29 DIAGNOSIS — E785 Hyperlipidemia, unspecified: Secondary | ICD-10-CM

## 2021-06-29 NOTE — Telephone Encounter (Signed)
Pt is calling and was told to continue to take her medications for 6 to 8 weeks and then callback to sch lab appt. Please put order in system and pt would like to have labs on 07-11-2021 the day her daughter Levonne Spiller sees dr Regis Bill

## 2021-07-02 NOTE — Telephone Encounter (Signed)
Please clarify  last visit  fApril   ? Is this a lipid panel?     if so please order  LIPID panel  fasting   Lab Results  Component Value Date   WBC 6.1 01/06/2021   HGB 13.8 01/06/2021   HCT 41.7 01/06/2021   PLT 191 01/06/2021   GLUCOSE 120 (H) 01/06/2021   CHOL 230 (H) 10/20/2020   TRIG 129.0 10/20/2020   HDL 62.80 10/20/2020   LDLDIRECT 174.5 03/03/2007   LDLCALC 141 (H) 10/20/2020   ALT 20 01/06/2021   AST 18 01/06/2021   NA 143 01/06/2021   K 4.2 01/06/2021   CL 105 01/06/2021   CREATININE 0.92 01/06/2021   BUN 19 01/06/2021   CO2 24 01/06/2021   TSH 2.620 02/12/2018   HGBA1C 6.7 (A) 05/05/2021   MICROALBUR 2.9 (H) 10/20/2020

## 2021-07-03 NOTE — Telephone Encounter (Signed)
Pt informed that lab orders have been placed.

## 2021-07-11 ENCOUNTER — Other Ambulatory Visit (INDEPENDENT_AMBULATORY_CARE_PROVIDER_SITE_OTHER): Payer: Medicare Other

## 2021-07-11 ENCOUNTER — Other Ambulatory Visit: Payer: Self-pay

## 2021-07-11 DIAGNOSIS — E785 Hyperlipidemia, unspecified: Secondary | ICD-10-CM | POA: Diagnosis not present

## 2021-07-11 DIAGNOSIS — Z79899 Other long term (current) drug therapy: Secondary | ICD-10-CM

## 2021-07-11 LAB — LIPID PANEL
Cholesterol: 152 mg/dL (ref 0–200)
HDL: 62.1 mg/dL (ref >39.00)
LDL Cholesterol: 69 mg/dL (ref 0–99)
NonHDL: 89.42
Total CHOL/HDL Ratio: 2
Triglycerides: 102 mg/dL (ref 0.0–149.0)
VLDL: 20.4 mg/dL (ref 0.0–40.0)

## 2021-07-12 NOTE — Progress Notes (Signed)
Lipid panel looks good at goal also sharing with Dr. Letta Median

## 2021-07-17 ENCOUNTER — Other Ambulatory Visit: Payer: Self-pay

## 2021-07-17 ENCOUNTER — Telehealth: Payer: Self-pay

## 2021-07-17 ENCOUNTER — Ambulatory Visit
Admission: EM | Admit: 2021-07-17 | Discharge: 2021-07-17 | Disposition: A | Discharge status: Home / Self Care | Payer: Medicare Other

## 2021-07-17 DIAGNOSIS — B9689 Other specified bacterial agents as the cause of diseases classified elsewhere: Secondary | ICD-10-CM

## 2021-07-17 DIAGNOSIS — J208 Acute bronchitis due to other specified organisms: Secondary | ICD-10-CM | POA: Diagnosis not present

## 2021-07-17 DIAGNOSIS — H109 Unspecified conjunctivitis: Secondary | ICD-10-CM

## 2021-07-17 DIAGNOSIS — J069 Acute upper respiratory infection, unspecified: Secondary | ICD-10-CM | POA: Diagnosis not present

## 2021-07-17 MED ORDER — CIPROFLOXACIN HCL 0.3 % OP SOLN: 2.0000 [drp] | OPHTHALMIC | 0 refills | Status: AC

## 2021-07-17 MED ORDER — AZITHROMYCIN 250 MG PO TABS: 250.0000 mg | ORAL_TABLET | Freq: Every day | ORAL | 0 refills | Status: DC
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
## 2021-07-17 NOTE — ED Triage Notes (Signed)
Pt reports having conjunctivitis and sinus infection (pt states she has pressure to ears and congestion).   She was given an abx for conjunctivitis.  Started: about a week ago

## 2021-07-17 NOTE — Discharge Instructions (Addendum)
For bacterial conjunctivitis and presumed bacterial bronchitis, please begin ciprofloxacin, 2 drops into each eye every 2 hours while you are awake for the next 10 days.  If you have had complete resolution of your symptoms after 7 days you can discontinue.  Please begin azithromycin, 2 tablets today and 1 tablet daily thereafter for full 5-day course, keep in mind that this medication lingers in the body for another 5 days so that you will have a full 10 days of coverage.  Thank you for visiting urgent care today, I hope you feel better soon.  If you have not had significant improvement of your symptoms by Tuesday evening, please go online to schedule yourself an appointment for Wednesday morning with this so I can reevaluate you and adjust your treatment regimen.

## 2021-07-17 NOTE — Telephone Encounter (Signed)
-  Caller states she has had a head cold all weak and spitting up mucus, now she has conjunctivitis, in both eyes for the last week. Pressure behind ears also. Shes afraid that this is viral. And she would like something to help her with this. Caller does state that another member in the house has similar symptoms which is why she thinks its viral. Currently having yellow sputum, sore throat, and laryngitis. Having yellow discharge from both eyes.  07/15/2021 8:47:55 AM Pharmacy Call Lorenda Cahill, RN, Caryl Pina Reason: [facility] Polytrim 2 drops both eyes 4 times daily for 5 days.  07/15/2021 8:40:33 AM Home Care Lorenda Cahill, RN, Ashley Rio Understands Yes  Standing Orders Polytrim Eye Drops 2 drops both eyes Eye Four Times Daily 5 Days Lorenda Cahill, Therapist, sports, Caryl Pina

## 2021-07-19 ENCOUNTER — Ambulatory Visit (INDEPENDENT_AMBULATORY_CARE_PROVIDER_SITE_OTHER): Payer: Medicare Other | Admitting: *Deleted

## 2021-07-19 ENCOUNTER — Other Ambulatory Visit: Payer: Self-pay

## 2021-07-19 ENCOUNTER — Ambulatory Visit
Admission: EM | Admit: 2021-07-19 | Discharge: 2021-07-19 | Disposition: A | Discharge status: Home / Self Care | Payer: Medicare Other | Attending: Emergency Medicine | Admitting: Emergency Medicine

## 2021-07-19 DIAGNOSIS — B9689 Other specified bacterial agents as the cause of diseases classified elsewhere: Secondary | ICD-10-CM | POA: Diagnosis not present

## 2021-07-19 DIAGNOSIS — H109 Unspecified conjunctivitis: Secondary | ICD-10-CM | POA: Diagnosis not present

## 2021-07-19 DIAGNOSIS — Z23 Encounter for immunization: Secondary | ICD-10-CM

## 2021-07-19 MED ORDER — KETOTIFEN FUMARATE 0.025 % OP SOLN: 1.0000 [drp] | Freq: Two times a day (BID) | OPHTHALMIC | 0 refills | Status: AC

## 2021-07-19 MED ORDER — OLOPATADINE HCL 0.1 % OP SOLN: 1.0000 [drp] | Freq: Two times a day (BID) | OPHTHALMIC | 0 refills | Status: AC
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
## 2021-07-19 NOTE — ED Triage Notes (Signed)
Pt is here for a follow up from last visit, she reports eye symptoms and congestion have not gotten better.

## 2021-07-19 NOTE — Discharge Instructions (Addendum)
As we discussed, please finish all tablets of azithromycin as prescribed.  Please continue using ciprofloxacin eyedrops every 2 hours but I want you to also use them at night as well, best you can, every 2-3 hours.  I have added a second eyedrop which is an antihistamine and a mast cell stabilizer called Zaditor (ketotifen) can be used twice daily, 1 drop in the morning 1 drop in the evening.  You may find that this eyedrop is very drying, while it is the purpose of his eyedrop, if it is too uncomfortable you may need to switch to once daily and I recommend that she use it at bedtime.  If your insurance is unwilling to cover the Zaditor eyedrops, I have sent a backup prescription of a medication called Patanol (olopatadine).  This medication can be used the same way.  Thank you again for visiting urgent care, you are very kind and I appreciate your kind words.  I am happy to see you anytime and I wish you a safe trip.

## 2021-09-05 LAB — HM DIABETES EYE EXAM

## 2021-09-08 ENCOUNTER — Other Ambulatory Visit: Payer: Self-pay

## 2021-09-08 ENCOUNTER — Encounter: Payer: Self-pay | Admitting: Internal Medicine

## 2021-09-08 ENCOUNTER — Ambulatory Visit (INDEPENDENT_AMBULATORY_CARE_PROVIDER_SITE_OTHER): Payer: Medicare Other | Admitting: Internal Medicine

## 2021-09-08 VITALS — BP 120/82 | HR 67 | Ht 63.5 in | Wt 242.0 lb

## 2021-09-08 DIAGNOSIS — E785 Hyperlipidemia, unspecified: Secondary | ICD-10-CM

## 2021-09-08 DIAGNOSIS — Z6841 Body Mass Index (BMI) 40.0 and over, adult: Secondary | ICD-10-CM

## 2021-09-08 DIAGNOSIS — E1165 Type 2 diabetes mellitus with hyperglycemia: Secondary | ICD-10-CM

## 2021-09-08 DIAGNOSIS — Z794 Long term (current) use of insulin: Secondary | ICD-10-CM

## 2021-09-08 LAB — POCT GLYCOSYLATED HEMOGLOBIN (HGB A1C): Hemoglobin A1C: 7.2 % — AB (ref 4.0–5.6)

## 2021-09-08 NOTE — Patient Instructions (Addendum)
Please continue: - Invokana 300 mg in am (we can use Jardiance, Farxiga, Steglatro) - Toujeo 56 units at bedtime  Change: - Lyumjev 15-25 units at the start of each meal   Please come back for a follow-up appointment in 3-4 months.
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
## 2021-10-20 ENCOUNTER — Other Ambulatory Visit: Payer: Self-pay | Admitting: Internal Medicine

## 2021-11-20 ENCOUNTER — Telehealth: Payer: Self-pay | Admitting: Internal Medicine

## 2021-11-20 NOTE — Telephone Encounter (Signed)
Left message for patient to call back and schedule Medicare Annual Wellness Visit (AWV) either virtually or in office. Left  my Paige Hernandez number [phone] ? ? ?Last AWV ;12/01/20 ?please schedule at anytime with [facility] Nurse Health Advisor 1 or 2 ? ? ?This should be a 45 minute visit.  ?

## 2021-11-29 ENCOUNTER — Telehealth: Payer: Self-pay | Admitting: Internal Medicine

## 2021-11-29 NOTE — Telephone Encounter (Signed)
Patient returned my call stating she didn't want to schedule at this time. ? ?She stated she will call back to schedule ?

## 2021-11-30 NOTE — Telephone Encounter (Signed)
error 

## 2022-01-11 ENCOUNTER — Telehealth: Payer: Self-pay | Admitting: Dietician

## 2022-01-11 NOTE — Telephone Encounter (Signed)
Returned patient call. ?She would like to upgrade from the [place] to the [place]. ? ?Her iPhone is compatible and she plans on using the app but per Medicare guidelines, a receiver will need to be ordered  ?Order should be placed through [facility]. ? ?She has an appointment with Dr. Cruzita Lederer in the am to discuss. ? ?Explained further upgraded features of the [place].  Discussed that she can call us if needed to start it if needed. ? ?Antonieta Iba, RD, LDN, CDCES ? ?

## 2022-01-12 ENCOUNTER — Encounter: Payer: Self-pay | Admitting: Internal Medicine

## 2022-01-12 ENCOUNTER — Ambulatory Visit (INDEPENDENT_AMBULATORY_CARE_PROVIDER_SITE_OTHER): Payer: Medicare Other | Admitting: Internal Medicine

## 2022-01-12 VITALS — BP 128/78 | HR 91 | Ht 63.5 in | Wt 247.6 lb

## 2022-01-12 DIAGNOSIS — E785 Hyperlipidemia, unspecified: Secondary | ICD-10-CM

## 2022-01-12 DIAGNOSIS — Z6841 Body Mass Index (BMI) 40.0 and over, adult: Secondary | ICD-10-CM

## 2022-01-12 DIAGNOSIS — Z794 Long term (current) use of insulin: Secondary | ICD-10-CM | POA: Diagnosis not present

## 2022-01-12 DIAGNOSIS — E1165 Type 2 diabetes mellitus with hyperglycemia: Secondary | ICD-10-CM | POA: Diagnosis not present

## 2022-01-12 LAB — POCT GLYCOSYLATED HEMOGLOBIN (HGB A1C): Hemoglobin A1C: 6.6 % — AB (ref 4.0–5.6)

## 2022-01-12 NOTE — Patient Instructions (Addendum)
Please continue: ?- Invokana 300 mg in am - try to not forget this!! (we can use Lovie Macadam[employer], [employer]) ?- Toujeo 56 units at bedtime ?- Lyumjev 15-25 units at the start of each meal  ? ?Please come back for a follow-up appointment in 4 months. ?
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
## 2022-01-25 ENCOUNTER — Encounter: Payer: Self-pay | Admitting: Internal Medicine

## 2022-01-30 ENCOUNTER — Other Ambulatory Visit: Payer: Self-pay

## 2022-01-30 ENCOUNTER — Telehealth: Payer: Self-pay

## 2022-01-30 DIAGNOSIS — D3A8 Other benign neuroendocrine tumors: Secondary | ICD-10-CM

## 2022-01-30 LAB — HM MAMMOGRAPHY

## 2022-01-30 NOTE — Telephone Encounter (Signed)
The pt has been advised and order entered.  I have sent the order to the schedulers to set up.

## 2022-01-30 NOTE — Telephone Encounter (Signed)
Left message on machine to call back  

## 2022-01-30 NOTE — Telephone Encounter (Signed)
-----   Message from Timothy Lasso, RN sent at 01/26/2022  8:51 AM EDT -----  ----- Message ----- From: Timothy Lasso, RN Sent: 01/26/2022  12:00 AM EDT To: Timothy Lasso, RN  [facility] in 2023 (1 year from now).

## 2022-02-02 ENCOUNTER — Encounter: Payer: Self-pay | Admitting: Internal Medicine

## 2022-02-06 ENCOUNTER — Telehealth: Payer: Self-pay

## 2022-02-06 NOTE — Telephone Encounter (Signed)
Inbound fax from DME supplier requesting form be completed and faxed with clinical notes. DME supplies ordered via [employer] through online portal.  

## 2022-02-13 ENCOUNTER — Other Ambulatory Visit: Payer: Self-pay | Admitting: Internal Medicine

## 2022-02-13 DIAGNOSIS — Z794 Long term (current) use of insulin: Secondary | ICD-10-CM

## 2022-02-21 ENCOUNTER — Encounter (HOSPITAL_COMMUNITY)
Admission: RE | Admit: 2022-02-21 | Discharge: 2022-02-21 | Disposition: A | Discharge status: Home / Self Care | Payer: Medicare Other | Source: Ambulatory Visit | Attending: Gastroenterology | Admitting: Gastroenterology

## 2022-02-21 DIAGNOSIS — R634 Abnormal weight loss: Secondary | ICD-10-CM | POA: Insufficient documentation

## 2022-02-21 DIAGNOSIS — D3A8 Other benign neuroendocrine tumors: Secondary | ICD-10-CM | POA: Insufficient documentation

## 2022-02-21 DIAGNOSIS — K529 Noninfective gastroenteritis and colitis, unspecified: Secondary | ICD-10-CM | POA: Diagnosis not present

## 2022-02-21 DIAGNOSIS — R7989 Other specified abnormal findings of blood chemistry: Secondary | ICD-10-CM | POA: Diagnosis not present

## 2022-02-21 MED ORDER — COPPER CU 64 DOTATATE 1 MCI/ML IV SOLN
4.0000 | Freq: Once | INTRAVENOUS | Status: AC
  Administered 2022-02-21: 4.12 via INTRAVENOUS

## 2022-04-06 ENCOUNTER — Ambulatory Visit (INDEPENDENT_AMBULATORY_CARE_PROVIDER_SITE_OTHER): Payer: Medicare Other | Admitting: Internal Medicine

## 2022-04-06 ENCOUNTER — Ambulatory Visit
Admission: EM | Admit: 2022-04-06 | Discharge: 2022-04-06 | Disposition: A | Discharge status: Home / Self Care | Payer: Medicare Other | Attending: Urgent Care | Admitting: Urgent Care

## 2022-04-06 ENCOUNTER — Encounter: Payer: Self-pay | Admitting: Internal Medicine

## 2022-04-06 ENCOUNTER — Encounter: Payer: Self-pay | Admitting: Emergency Medicine

## 2022-04-06 VITALS — BP 118/82 | HR 71 | Ht 63.5 in | Wt 247.0 lb

## 2022-04-06 DIAGNOSIS — Z794 Long term (current) use of insulin: Secondary | ICD-10-CM

## 2022-04-06 DIAGNOSIS — F419 Anxiety disorder, unspecified: Secondary | ICD-10-CM | POA: Diagnosis not present

## 2022-04-06 DIAGNOSIS — Z6841 Body Mass Index (BMI) 40.0 and over, adult: Secondary | ICD-10-CM

## 2022-04-06 DIAGNOSIS — E1165 Type 2 diabetes mellitus with hyperglycemia: Secondary | ICD-10-CM | POA: Diagnosis not present

## 2022-04-06 DIAGNOSIS — F40243 Fear of flying: Secondary | ICD-10-CM

## 2022-04-06 DIAGNOSIS — F40240 Claustrophobia: Secondary | ICD-10-CM | POA: Diagnosis not present

## 2022-04-06 DIAGNOSIS — E785 Hyperlipidemia, unspecified: Secondary | ICD-10-CM | POA: Diagnosis not present

## 2022-04-06 LAB — POCT GLYCOSYLATED HEMOGLOBIN (HGB A1C): Hemoglobin A1C: 7.1 % — AB (ref 4.0–5.6)

## 2022-04-06 MED ORDER — ALPRAZOLAM 0.25 MG PO TABS: 0.2500 mg | ORAL_TABLET | Freq: Every day | ORAL | 0 refills | Status: AC | PRN

## 2022-04-06 MED ORDER — EMPAGLIFLOZIN 25 MG PO TABS: 25.0000 mg | ORAL_TABLET | Freq: Every day | ORAL | 3 refills | Status: DC

## 2022-04-06 MED ORDER — HYDROXYZINE HCL 25 MG PO TABS: 12.5000 mg | ORAL_TABLET | Freq: Three times a day (TID) | ORAL | 0 refills | Status: DC | PRN

## 2022-04-06 NOTE — ED Triage Notes (Addendum)
Pt here requesting refill for xanax for flying [place]. She has not tried to send a message to her PCP for a refill. Was told to try to do a virtual visit, but did not or was unable to.
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
## 2022-04-06 NOTE — Patient Instructions (Addendum)
Please start: - Jardiance 25 mg before b'fast  Please continue: - Toujeo 56 units at bedtime  Use: - Lyumjev 20-30 units at the start of each meal (consider 5-10 min before meals if sugars remain high)  Please come back for a follow-up appointment in 4 months.
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
## 2022-08-31 ENCOUNTER — Telehealth: Payer: Self-pay

## 2022-08-31 NOTE — Telephone Encounter (Signed)
Inbound fax and call from DME supplier requesting form be completed and faxed with clinical notes. DME supplies ordered via [employer] through online portal.

## 2022-09-10 ENCOUNTER — Telehealth: Payer: Self-pay

## 2022-09-10 NOTE — Telephone Encounter (Signed)
She called me re: PREP program referral from [place]; wants to attend [facility] class on 10/16/22, every T/Th 2-3:15.

## 2022-10-03 ENCOUNTER — Telehealth: Payer: Self-pay

## 2022-10-03 NOTE — Telephone Encounter (Signed)
Called to confirm participation in PREP class on 10/16/22 and schedule assessment visit; left voicemail
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
## 2022-10-16 NOTE — Progress Notes (Signed)
[facility] PREP Weekly Session  Patient Details  Name: Paige Hernandez MRN: [id number] Date of Birth: March 16, 1956 Age: 67 y.o. PCP: Burnis Medin, MD  There were no vitals filed for this visit.   [facility] Weekly seesion - 10/16/22 1600       [facility] "PREP" Location   [facility] "PREP" Location [facility]      Weekly Session   Topic Discussed Goal setting and welcome to the program   Introductions, Review of notebook, tour of facility   Classes attended to date [place] 10/16/2022, 4:03 PM

## 2022-10-23 NOTE — Progress Notes (Signed)
[facility] PREP Weekly Session  Patient Details  Name: Paige Hernandez MRN: [id number] Date of Birth: [birth year] Age: 67 y.o. PCP: Burnis Medin, MD  Vitals:   10/23/22 1539  Weight: 230 lb 9.6 oz (104.6 kg)     [facility] Weekly seesion - 10/23/22 1500       [facility] "PREP" Location   [facility] "PREP" Location [facility]      Weekly Session   Topic Discussed Importance of resistance training;Other ways to be active   Work up to 150 min of cardio/wk; strength training 2-3 times/wk for 20-40 minutes; benefits of CV and strength training   Classes attended to date [place] 10/23/2022, 3:40 PM

## 2022-10-26 ENCOUNTER — Telehealth: Payer: Self-pay

## 2022-10-26 NOTE — Telephone Encounter (Signed)
Inbound fax from DME supplier requesting form be completed and faxed with clinical notes. DME supplies ordered via [employer] through online portal.  

## 2022-11-06 NOTE — Progress Notes (Signed)
Today's talk covered Health habits and we reviewed sugar amounts in popular foods, did not discuss calorie breakdown which will be a topic covered later in the program.

## 2022-11-06 NOTE — Progress Notes (Signed)
[facility] PREP Weekly Session  Patient Details  Name: Paige Hernandez MRN: [id number] Date of Birth: September 27, 1955 Age: 67 y.o. PCP: Burnis Medin, MD  Vitals:   11/06/22 1545  Weight: 227 lb (103 kg)     [facility] Weekly seesion - 11/06/22 1500       [facility] "PREP" Location   [facility] "PREP" Location [facility]      Weekly Session   Topic Discussed Calorie breakdown   Macronutrients: fats, proten and carbs; difference between simple and complex carbs   Minutes exercised this week 140 minutes    Classes attended to date [place] 11/06/2022, 3:45 PM

## 2022-11-09 NOTE — Telephone Encounter (Signed)
Inbound fax from DME supplier requesting form be completed and faxed with clinical notes. DME supplies ordered via [employer] through online portal. Dexcom G6

## 2022-11-13 NOTE — Progress Notes (Signed)
[facility] PREP Weekly Session  Patient Details  Name: Paige Hernandez MRN: [id number] Date of Birth: [birth year] Age: 67 y.o. PCP: Burnis Medin, MD  Vitals:   11/13/22 1521  Weight: 233 lb 12.8 oz (106.1 kg)     [facility] Weekly seesion - 11/13/22 1500       [facility] "PREP" Location   [facility] "PREP" Location [facility]      Weekly Session   Topic Discussed Restaurant Eating   salt demo   Minutes exercised this week 100 minutes    Classes attended to date [place] 11/13/2022, 3:22 PM

## 2022-11-16 ENCOUNTER — Encounter: Payer: Self-pay | Admitting: Internal Medicine

## 2022-11-16 ENCOUNTER — Ambulatory Visit (INDEPENDENT_AMBULATORY_CARE_PROVIDER_SITE_OTHER): Payer: Medicare Other | Admitting: Internal Medicine

## 2022-11-16 VITALS — BP 122/76 | HR 71 | Ht 63.5 in | Wt 234.4 lb

## 2022-11-16 DIAGNOSIS — E785 Hyperlipidemia, unspecified: Secondary | ICD-10-CM | POA: Diagnosis not present

## 2022-11-16 DIAGNOSIS — Z6841 Body Mass Index (BMI) 40.0 and over, adult: Secondary | ICD-10-CM

## 2022-11-16 DIAGNOSIS — Z794 Long term (current) use of insulin: Secondary | ICD-10-CM

## 2022-11-16 DIAGNOSIS — E1165 Type 2 diabetes mellitus with hyperglycemia: Secondary | ICD-10-CM

## 2022-11-16 LAB — POCT GLYCOSYLATED HEMOGLOBIN (HGB A1C): Hemoglobin A1C: 10 % — AB (ref 4.0–5.6)

## 2022-11-16 MED ORDER — ATORVASTATIN CALCIUM 20 MG PO TABS
20.0000 mg | ORAL_TABLET | Freq: Every day | ORAL | 3 refills | Status: DC
Start: 2022-11-16 — End: 2023-02-21

## 2022-11-16 MED ORDER — EMPAGLIFLOZIN 25 MG PO TABS: 25.0000 mg | ORAL_TABLET | Freq: Every day | ORAL | 3 refills | Status: DC
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
## 2022-11-16 NOTE — Patient Instructions (Addendum)
Please start: - Jardiance 12.5 mg before b'fast and increase to 25 if tolerated well  Continue: - Toujeo 56-58 units at bedtime - Lyumjev 25-28 units at the start of each meal    Please come back for a follow-up appointment in 2 months.

## 2022-11-20 NOTE — Progress Notes (Signed)
[facility] PREP Weekly Session  Patient Details  Name: Paige Hernandez MRN: [id number] Date of Birth: 22-Jan-1956 Age: 67 y.o. PCP: Burnis Medin, MD  Vitals:   11/20/22 1528  Weight: 233 lb (105.7 kg)     [facility] Weekly seesion - 11/20/22 1500       [facility] "PREP" Location   [facility] "PREP" Location [facility]      Weekly Session   Topic Discussed Stress management and problem solving   Better Sleep Guidelines, finger tip mudra breathwork   Minutes exercised this week 150 minutes    Classes attended to date [place] 11/20/2022, 3:28 PM

## 2022-11-27 NOTE — Progress Notes (Signed)
[facility] PREP Weekly Session  Patient Details  Name: Paige Hernandez MRN: [id number] Date of Birth: [birth year] Age: 67 y.o. PCP: Burnis Medin, MD  Vitals:   11/27/22 1522  Weight: 228 lb 6.4 oz (103.6 kg)     [facility] Weekly seesion - 11/27/22 1500       [facility] "PREP" Location   [facility] "PREP" Location [facility]      Weekly Session   Topic Discussed Expectations and non-scale victories   Halfway through program review, revisit refocus on goals; staying postive   Minutes exercised this week 175 minutes    Classes attended to date [place] 11/27/2022, 3:24 PM

## 2022-12-05 NOTE — Progress Notes (Signed)
[facility] PREP Weekly Session  Patient Details  Name: Paige Hernandez MRN: [id number] Date of Birth: 15-Feb-1956 Age: 68 y.o. PCP: Burnis Medin, MD  Vitals:   12/04/22 1400  Weight: 232 lb 8 oz (105.5 kg)     [facility] Weekly seesion - 12/05/22 0800       [facility] "PREP" Location   [facility] "PREP" Location [facility]      Weekly Session   Topic Discussed Other   Portion Control; Visualize your portion size demo; Red sugar [place] label review   Minutes exercised this week 200 minutes    Classes attended to date [place] 12/05/2022, 8:26 AM

## 2022-12-11 NOTE — Progress Notes (Signed)
[facility] PREP Weekly Session  Patient Details  Name: CLORICE DEBRULER MRN: [id number] Date of Birth: May 15, 1956 Age: 67 y.o. PCP: Madelin Headings, MD  There were no vitals filed for this visit.   [facility] Weekly seesion - 12/11/22 1500       [facility] "PREP" Location   [facility] "PREP" Location [facility]      Weekly Session   Topic Discussed Finding support   Review of food labels brought in from home   Classes attended to date 58             Vivek Grealish B Gillian Meeuwsen 12/11/2022, 3:25 PM

## 2022-12-18 NOTE — Progress Notes (Signed)
[facility] PREP Weekly Session  Patient Details  Name: Paige Hernandez MRN: [id number] Date of Birth: Apr 29, 1956 Age: 67 y.o. PCP: Madelin Headings, MD  Vitals:   12/18/22 1511  Weight: 233 lb 6.4 oz (105.9 kg)     [facility] Weekly seesion - 12/18/22 1500       [facility] "PREP" Location   [facility] "PREP" Location [facility]      Weekly Session   Topic Discussed Calorie breakdown   carbs, fats and proteins guidelines; difference between simple and complex carbs   Minutes exercised this week 150 minutes    Classes attended to date 35             Tiger Spieker B Roshunda Keir 12/18/2022, 3:12 PM

## 2022-12-25 NOTE — Progress Notes (Signed)
[facility] PREP Weekly Session  Patient Details  Name: CARLI LEFEVERS MRN: [id number] Date of Birth: 08-17-1956 Age: 67 y.o. PCP: Madelin Headings, MD  Vitals:   12/25/22 1546  Weight: 231 lb 6.4 oz (105 kg)     [facility] Weekly seesion - 12/25/22 1500       [facility] "PREP" Location   [facility] "PREP" Location [facility]      Weekly Session   Topic Discussed Hitting roadblocks   Membership talk w/Nick,  [employer] Ship broker   Minutes exercised this week 220 minutes    Classes attended to date 87             Sonia Baller 12/25/2022, 3:48 PM

## 2023-01-01 NOTE — Progress Notes (Signed)
[facility] PREP Weekly Session  Patient Details  Name: Paige Hernandez MRN: [id number] Date of Birth: 11-Jun-1956 Age: 67 y.o. PCP: Madelin Headings, MD  Vitals:   01/01/23 1545  Weight: 234 lb 6.4 oz (106.3 kg)     [facility] Weekly seesion - 01/01/23 1500       [facility] "PREP" Location   [facility] "PREP" Location [facility]      Weekly Session   Topic Discussed Other   Fit testing completed; how fit and strong survey completed; final assessment visited scheduled; 4 stage balance test.   Minutes exercised this week 105 minutes    Classes attended to date 46             Paige Hernandez 01/01/2023, 3:48 PM

## 2023-01-02 ENCOUNTER — Encounter: Payer: Self-pay | Admitting: Gastroenterology
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
## 2023-01-12 ENCOUNTER — Other Ambulatory Visit: Payer: Self-pay | Admitting: Internal Medicine

## 2023-01-12 DIAGNOSIS — E1165 Type 2 diabetes mellitus with hyperglycemia: Secondary | ICD-10-CM

## 2023-01-18 ENCOUNTER — Encounter: Payer: Self-pay | Admitting: Internal Medicine

## 2023-01-18 ENCOUNTER — Ambulatory Visit (INDEPENDENT_AMBULATORY_CARE_PROVIDER_SITE_OTHER): Payer: Medicare Other | Admitting: Internal Medicine

## 2023-01-18 VITALS — BP 112/80 | HR 83 | Ht 63.5 in | Wt 229.8 lb

## 2023-01-18 DIAGNOSIS — Z794 Long term (current) use of insulin: Secondary | ICD-10-CM | POA: Diagnosis not present

## 2023-01-18 DIAGNOSIS — E119 Type 2 diabetes mellitus without complications: Secondary | ICD-10-CM

## 2023-01-18 DIAGNOSIS — Z7984 Long term (current) use of oral hypoglycemic drugs: Secondary | ICD-10-CM | POA: Diagnosis not present

## 2023-01-18 DIAGNOSIS — Z6841 Body Mass Index (BMI) 40.0 and over, adult: Secondary | ICD-10-CM

## 2023-01-18 DIAGNOSIS — E1165 Type 2 diabetes mellitus with hyperglycemia: Secondary | ICD-10-CM | POA: Diagnosis not present

## 2023-01-18 DIAGNOSIS — E785 Hyperlipidemia, unspecified: Secondary | ICD-10-CM

## 2023-01-18 LAB — POCT GLYCOSYLATED HEMOGLOBIN (HGB A1C): Hemoglobin A1C: 7.8 % — AB (ref 4.0–5.6)

## 2023-01-18 NOTE — Patient Instructions (Addendum)
Please continue: - Jardiance 25 mg before b'fast  - Toujeo 56-58 units at bedtime - Lyumjev 18-26 units at the start of each meal   Try to restart exercise.   Please come back for a follow-up appointment in 3-4 months.
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
## 2023-02-05 LAB — HM MAMMOGRAPHY

## 2023-02-06 ENCOUNTER — Encounter: Payer: Self-pay | Admitting: Internal Medicine

## 2023-02-08 LAB — HM DEXA SCAN

## 2023-02-11 ENCOUNTER — Encounter: Payer: Self-pay | Admitting: Internal Medicine

## 2023-02-21 ENCOUNTER — Telehealth: Payer: Self-pay

## 2023-02-21 ENCOUNTER — Ambulatory Visit (INDEPENDENT_AMBULATORY_CARE_PROVIDER_SITE_OTHER): Payer: Medicare Other | Admitting: Internal Medicine

## 2023-02-21 ENCOUNTER — Other Ambulatory Visit: Payer: Self-pay

## 2023-02-21 VITALS — BP 106/78 | HR 75 | Temp 97.7°F | Ht 63.5 in | Wt 223.2 lb

## 2023-02-21 DIAGNOSIS — E785 Hyperlipidemia, unspecified: Secondary | ICD-10-CM

## 2023-02-21 DIAGNOSIS — Z79899 Other long term (current) drug therapy: Secondary | ICD-10-CM

## 2023-02-21 DIAGNOSIS — E559 Vitamin D deficiency, unspecified: Secondary | ICD-10-CM

## 2023-02-21 DIAGNOSIS — Z794 Long term (current) use of insulin: Secondary | ICD-10-CM

## 2023-02-21 DIAGNOSIS — D3A010 Benign carcinoid tumor of the duodenum: Secondary | ICD-10-CM

## 2023-02-21 DIAGNOSIS — M899 Disorder of bone, unspecified: Secondary | ICD-10-CM

## 2023-02-21 DIAGNOSIS — F4322 Adjustment disorder with anxiety: Secondary | ICD-10-CM | POA: Diagnosis not present

## 2023-02-21 DIAGNOSIS — M8580 Other specified disorders of bone density and structure, unspecified site: Secondary | ICD-10-CM

## 2023-02-21 DIAGNOSIS — D3A8 Other benign neuroendocrine tumors: Secondary | ICD-10-CM

## 2023-02-21 DIAGNOSIS — K219 Gastro-esophageal reflux disease without esophagitis: Secondary | ICD-10-CM

## 2023-02-21 DIAGNOSIS — K2100 Gastro-esophageal reflux disease with esophagitis, without bleeding: Secondary | ICD-10-CM | POA: Diagnosis not present

## 2023-02-21 DIAGNOSIS — R1310 Dysphagia, unspecified: Secondary | ICD-10-CM

## 2023-02-21 DIAGNOSIS — E1165 Type 2 diabetes mellitus with hyperglycemia: Secondary | ICD-10-CM | POA: Diagnosis not present

## 2023-02-21 LAB — LIPID PANEL
Cholesterol: 224 mg/dL — ABNORMAL HIGH (ref 0–200)
HDL: 57.5 mg/dL (ref >39.00)
LDL Cholesterol: 138 mg/dL — ABNORMAL HIGH (ref 0–99)
NonHDL: 166.64
Total CHOL/HDL Ratio: 4
Triglycerides: 141 mg/dL (ref 0.0–149.0)
VLDL: 28.2 mg/dL (ref 0.0–40.0)

## 2023-02-21 LAB — BASIC METABOLIC PANEL
BUN: 20 mg/dL (ref 6–23)
CO2: 27 mEq/L (ref 19–32)
Calcium: 9.3 mg/dL (ref 8.4–10.5)
Chloride: 102 mEq/L (ref 96–112)
Creatinine, Ser: 1.09 mg/dL (ref 0.40–1.20)
GFR: 52.17 mL/min — ABNORMAL LOW (ref >60.00)
Glucose, Bld: 295 mg/dL — ABNORMAL HIGH (ref 70–99)
Potassium: 4.6 mEq/L (ref 3.5–5.1)
Sodium: 137 mEq/L (ref 135–145)

## 2023-02-21 LAB — CBC WITH DIFFERENTIAL/PLATELET
Basophils Absolute: 0 10*3/uL (ref 0.0–0.1)
Basophils Relative: 0.7 % (ref 0.0–3.0)
Eosinophils Absolute: 0.3 10*3/uL (ref 0.0–0.7)
Eosinophils Relative: 5.1 % — ABNORMAL HIGH (ref 0.0–5.0)
HCT: 47.3 % — ABNORMAL HIGH (ref 36.0–46.0)
Hemoglobin: 15.5 g/dL — ABNORMAL HIGH (ref 12.0–15.0)
Lymphocytes Relative: 24.7 % (ref 12.0–46.0)
Lymphs Abs: 1.5 10*3/uL (ref 0.7–4.0)
MCHC: 32.7 g/dL (ref 30.0–36.0)
MCV: 87.1 fl (ref 78.0–100.0)
Monocytes Absolute: 0.4 10*3/uL (ref 0.1–1.0)
Monocytes Relative: 6.8 % (ref 3.0–12.0)
Neutro Abs: 3.9 10*3/uL (ref 1.4–7.7)
Neutrophils Relative %: 62.7 % (ref 43.0–77.0)
Platelets: 204 10*3/uL (ref 150.0–400.0)
RBC: 5.43 Mil/uL — ABNORMAL HIGH (ref 3.87–5.11)
RDW: 13.4 % (ref 11.5–15.5)
WBC: 6.2 10*3/uL (ref 4.0–10.5)

## 2023-02-21 LAB — HEPATIC FUNCTION PANEL
ALT: 25 U/L (ref 0–35)
AST: 19 U/L (ref 0–37)
Albumin: 4 g/dL (ref 3.5–5.2)
Alkaline Phosphatase: 91 U/L (ref 39–117)
Bilirubin, Direct: 0.1 mg/dL (ref 0.0–0.3)
Total Bilirubin: 0.6 mg/dL (ref 0.2–1.2)
Total Protein: 7.1 g/dL (ref 6.0–8.3)

## 2023-02-21 LAB — VITAMIN D 25 HYDROXY (VIT D DEFICIENCY, FRACTURES): VITD: 26.62 ng/mL — ABNORMAL LOW (ref 30.00–100.00)

## 2023-02-21 LAB — TSH: TSH: 2.12 u[IU]/mL (ref 0.35–5.50)

## 2023-02-21 LAB — HEMOGLOBIN A1C: Hgb A1c MFr Bld: 8.7 % — ABNORMAL HIGH (ref 4.6–6.5)

## 2023-02-21 LAB — VITAMIN B12: Vitamin B-12: 419 pg/mL (ref 211–911)

## 2023-02-21 MED ORDER — HYDROXYZINE HCL 25 MG PO TABS: 12.5000 mg | ORAL_TABLET | Freq: Three times a day (TID) | ORAL | 3 refills | Status: DC | PRN

## 2023-02-21 MED ORDER — ATORVASTATIN CALCIUM 20 MG PO TABS: 20.0000 mg | ORAL_TABLET | Freq: Every day | ORAL | 3 refills | Status: DC

## 2023-02-21 NOTE — Patient Instructions (Addendum)
Good to see you today.   Plan get back with GI team  about the  upper esophageal sx . And let them decide on medication .  Bone density  lower in one hip -2.4 t score   Malabsorption is a risk . Will check Vit d level and PTH  level. That can effect bone density  Then Will also  have Dr Reece Agar involved  Resistance  training for bone density help.  Lets try as needed hydroxyzine   for now .  Plan fu in 3-4 months to see how doing and we can restart the lexapro as needed.

## 2023-02-21 NOTE — Telephone Encounter (Signed)
EGD EUS has been scheduled for 05/21/23 at 8 am with GM at [facility]   Left message on machine to call back

## 2023-02-21 NOTE — Telephone Encounter (Signed)
EUS/EGD  scheduled, pt instructed and medications reviewed.  Patient instructions mailed to home.  Patient to call with any questions or concerns.  

## 2023-02-21 NOTE — Telephone Encounter (Signed)
-----   Message from Lemar Lofty., MD sent at 02/21/2023  3:09 PM EDT ----- [name], Thanks for sending this.  [name], She needs her EGD/EUS to be scheduled. I don't think I can get her in for Colon at the same time with my availability issues. Will let Dr. Lavon Paganini decide on timing of Colonoscopy recall and discuss her swallowing issues as well. Thanks. GM  FYI KVN ----- Message ----- From: Madelin Headings, MD Sent: 02/21/2023   1:45 PM EDT To: Napoleon Form, MD; #  I advised she make appt with Gi team as she is having increasing esophageal sx and  needing care with swallowing, and alos due for colon?Please advise her , I did not begin  a ppi , she is taking pepcid thanks  [facility]

## 2023-02-21 NOTE — Progress Notes (Signed)
-  2.4 at hip rest stable  see ov 6 20 24
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
## 2023-02-22 LAB — PTH, INTACT AND CALCIUM
Calcium: 9.1 mg/dL (ref 8.6–10.4)
PTH: 45 pg/mL (ref 16–77)

## 2023-02-22 NOTE — Progress Notes (Signed)
Paige Hernandez, can you please bring Paige Hernandez in for office visit with me or APP next available. Thanks

## 2023-02-25 ENCOUNTER — Other Ambulatory Visit: Payer: Self-pay

## 2023-02-25 NOTE — Progress Notes (Signed)
A1c  8.7 share with dr Reece Agar  will also have her fu with bone density at next visit . No anemia   Lipids need to be better  ldl is 138 need to be below 100  Increase atorvastatin to 40 mg per day Staff please send in 40 mg  per day disp  90 ( ok to double up and take 2 x 20 mg of what is left over.  Vit d slightly low  increase  vit d intake by 1000   per day   Plan fu lipid panel in 3-4 months after  beginning  40 mg dose.

## 2023-02-27 ENCOUNTER — Other Ambulatory Visit: Payer: Self-pay

## 2023-02-27 DIAGNOSIS — E785 Hyperlipidemia, unspecified: Secondary | ICD-10-CM

## 2023-02-27 MED ORDER — ATORVASTATIN CALCIUM 40 MG PO TABS: 40.0000 mg | ORAL_TABLET | Freq: Every day | ORAL | 0 refills | Status: DC

## 2023-04-01 ENCOUNTER — Ambulatory Visit (INDEPENDENT_AMBULATORY_CARE_PROVIDER_SITE_OTHER): Payer: Medicare Other

## 2023-04-01 VITALS — Ht 63.5 in | Wt 227.0 lb

## 2023-04-01 DIAGNOSIS — Z0000 Encounter for general adult medical examination without abnormal findings: Secondary | ICD-10-CM
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
## 2023-04-01 NOTE — Patient Instructions (Addendum)
Paige Hernandez , Thank you for taking time to come for your Medicare Wellness Visit. I appreciate your ongoing commitment to your health goals. Please review the following plan we discussed and let me know if I can assist you in the future.   Referrals/Orders/Follow-Ups/Clinician Recommendations:   This is a list of the screening recommended for you and due dates:  Health Maintenance  Topic Date Due   Yearly kidney health urinalysis for diabetes  10/20/2021   Colon Cancer Screening  12/31/2022   COVID-19 Vaccine (4 - 2023-24 season) 04/17/2023*   Flu Shot  04/04/2023   DTaP/Tdap/Td vaccine (3 - Td or Tdap) 04/28/2023   Hemoglobin A1C  08/23/2023   Eye exam for diabetics  11/07/2023   Complete foot exam   11/16/2023   Mammogram  02/05/2024   Yearly kidney function blood test for diabetes  02/21/2024   Medicare Annual Wellness Visit  03/31/2024   Pneumonia Vaccine  Completed   DEXA scan (bone density measurement)  Completed   Hepatitis C Screening  Completed   Zoster (Shingles) Vaccine  Completed   HPV Vaccine  Aged Out  *Topic was postponed. The date shown is not the original due date.    Advanced directives:   Next Medicare Annual Wellness Visit scheduled for next year: Yes  Preventive Care 45 Years and Older, Female Preventive care refers to lifestyle choices and visits with your health care provider that can promote health and wellness. What does preventive care include? A yearly physical exam. This is also called an annual well check. Dental exams once or twice a year. Routine eye exams. Ask your health care provider how often you should have your eyes checked. Personal lifestyle choices, including: Daily care of your teeth and gums. Regular physical activity. Eating a healthy diet. Avoiding tobacco and drug use. Limiting alcohol use. Practicing safe sex. Taking low-dose aspirin every day. Taking vitamin and mineral supplements as recommended by your health care  provider. What happens during an annual well check? The services and screenings done by your health care provider during your annual well check will depend on your age, overall health, lifestyle risk factors, and family history of disease. Counseling  Your health care provider may ask you questions about your: Alcohol use. Tobacco use. Drug use. Emotional well-being. Home and relationship well-being. Sexual activity. Eating habits. History of falls. Memory and ability to understand (cognition). Work and work Astronomer. Reproductive health. Screening  You may have the following tests or measurements: Height, weight, and BMI. Blood pressure. Lipid and cholesterol levels. These may be checked every 5 years, or more frequently if you are over 6 years old. Skin check. Lung cancer screening. You may have this screening every year starting at age 57 if you have a 30-pack-year history of smoking and currently smoke or have quit within the past 15 years. Fecal occult blood test (FOBT) of the stool. You may have this test every year starting at age 63. Flexible sigmoidoscopy or colonoscopy. You may have a sigmoidoscopy every 5 years or a colonoscopy every 10 years starting at age 93. Hepatitis C blood test. Hepatitis B blood test. Sexually transmitted disease (STD) testing. Diabetes screening. This is done by checking your blood sugar (glucose) after you have not eaten for a while (fasting). You may have this done every 1-3 years. Bone density scan. This is done to screen for osteoporosis. You may have this done starting at age 18. Mammogram. This may be done every 1-2 years. Talk to  your health care provider about how often you should have regular mammograms. Talk with your health care provider about your test results, treatment options, and if necessary, the need for more tests. Vaccines  Your health care provider may recommend certain vaccines, such as: Influenza vaccine. This is  recommended every year. Tetanus, diphtheria, and acellular pertussis (Tdap, Td) vaccine. You may need a Td booster every 10 years. Zoster vaccine. You may need this after age 29. Pneumococcal 13-valent conjugate (PCV13) vaccine. One dose is recommended after age 65. Pneumococcal polysaccharide (PPSV23) vaccine. One dose is recommended after age 30. Talk to your health care provider about which screenings and vaccines you need and how often you need them. This information is not intended to replace advice given to you by your health care provider. Make sure you discuss any questions you have with your health care provider. Document Released: 09/16/2015 Document Revised: 05/09/2016 Document Reviewed: 06/21/2015 Elsevier Interactive Patient Education  2017 [employer].  Fall Prevention in the Home Falls can cause injuries. They can happen to people of all ages. There are many things you can do to make your home safe and to help prevent falls. What can I do on the outside of my home? Regularly fix the edges of walkways and driveways and fix any cracks. Remove anything that might make you trip as you walk through a door, such as a raised step or threshold. Trim any bushes or trees on the path to your home. Use bright outdoor lighting. Clear any walking paths of anything that might make someone trip, such as rocks or tools. Regularly check to see if handrails are loose or broken. Make sure that both sides of any steps have handrails. Any raised decks and porches should have guardrails on the edges. Have any leaves, snow, or ice cleared regularly. Use sand or salt on walking paths during winter. Clean up any spills in your garage right away. This includes oil or grease spills. What can I do in the bathroom? Use night lights. Install grab bars by the toilet and in the tub and shower. Do not use towel bars as grab bars. Use non-skid mats or decals in the tub or shower. If you need to sit down in  the shower, use a plastic, non-slip stool. Keep the floor dry. Clean up any water that spills on the floor as soon as it happens. Remove soap buildup in the tub or shower regularly. Attach bath mats securely with double-sided non-slip rug tape. Do not have throw rugs and other things on the floor that can make you trip. What can I do in the bedroom? Use night lights. Make sure that you have a light by your bed that is easy to reach. Do not use any sheets or blankets that are too big for your bed. They should not hang down onto the floor. Have a firm chair that has side arms. You can use this for support while you get dressed. Do not have throw rugs and other things on the floor that can make you trip. What can I do in the kitchen? Clean up any spills right away. Avoid walking on wet floors. Keep items that you use a lot in easy-to-reach places. If you need to reach something above you, use a strong step stool that has a grab bar. Keep electrical cords out of the way. Do not use floor polish or wax that makes floors slippery. If you must use wax, use non-skid floor wax. Do not  have throw rugs and other things on the floor that can make you trip. What can I do with my stairs? Do not leave any items on the stairs. Make sure that there are handrails on both sides of the stairs and use them. Fix handrails that are broken or loose. Make sure that handrails are as long as the stairways. Check any carpeting to make sure that it is firmly attached to the stairs. Fix any carpet that is loose or worn. Avoid having throw rugs at the top or bottom of the stairs. If you do have throw rugs, attach them to the floor with carpet tape. Make sure that you have a light switch at the top of the stairs and the bottom of the stairs. If you do not have them, ask someone to add them for you. What else can I do to help prevent falls? Wear shoes that: Do not have high heels. Have rubber bottoms. Are comfortable  and fit you well. Are closed at the toe. Do not wear sandals. If you use a stepladder: Make sure that it is fully opened. Do not climb a closed stepladder. Make sure that both sides of the stepladder are locked into place. Ask someone to hold it for you, if possible. Clearly mark and make sure that you can see: Any grab bars or handrails. First and last steps. Where the edge of each step is. Use tools that help you move around (mobility aids) if they are needed. These include: Canes. Walkers. Scooters. Crutches. Turn on the lights when you go into a dark area. Replace any light bulbs as soon as they burn out. Set up your furniture so you have a clear path. Avoid moving your furniture around. If any of your floors are uneven, fix them. If there are any pets around you, be aware of where they are. Review your medicines with your doctor. Some medicines can make you feel dizzy. This can increase your chance of falling. Ask your doctor what other things that you can do to help prevent falls. This information is not intended to replace advice given to you by your health care provider. Make sure you discuss any questions you have with your health care provider. Document Released: 06/16/2009 Document Revised: 01/26/2016 Document Reviewed: 09/24/2014 Elsevier Interactive Patient Education  2017 [employer].

## 2023-04-11 ENCOUNTER — Other Ambulatory Visit: Payer: Self-pay | Admitting: Internal Medicine

## 2023-04-11 DIAGNOSIS — E1165 Type 2 diabetes mellitus with hyperglycemia: Secondary | ICD-10-CM

## 2023-04-18 ENCOUNTER — Encounter (INDEPENDENT_AMBULATORY_CARE_PROVIDER_SITE_OTHER): Payer: Self-pay

## 2023-05-08 ENCOUNTER — Telehealth: Payer: Self-pay | Admitting: Gastroenterology

## 2023-05-08 NOTE — Telephone Encounter (Signed)
Inbound call from patient requesting for Dr. Lavon Paganini to request for her esophagus to be stretch during upcoming endoscopy with Dr. Meridee Score. Patient states Dr. Lavon Paganini made that requests with Dr. Meridee Score when she last had an endoscopy 2 years ago. Patient also states she has been having sever heartburn and hiatal hernia attacks. Patient is wondering if it is possible to speak with Dr. Lavon Paganini prior to her procedure. Patient also requesting an stomach emptying pil. Patient requesting a call to discuss further. Please advise, thank you.

## 2023-05-09 NOTE — Telephone Encounter (Signed)
Office visit 06/25/23. Please advise.

## 2023-05-10 ENCOUNTER — Other Ambulatory Visit: Payer: Self-pay

## 2023-05-10 MED ORDER — METOCLOPRAMIDE HCL 10 MG PO TABS: ORAL_TABLET | ORAL | 0 refills | Status: DC

## 2023-05-10 NOTE — Telephone Encounter (Signed)
It may not be a problem to do esophageal dilation during EGD and EUS if we can get prior approval from insurance.  Indication dysphagia and persistent GERD despite therapy.  She has history of gastroparesis, Dr. Meridee Score had prescribed her promotility agent in the past, please send Rx for Reglan 10 mg at bedtime prior to the procedure to ensure her stomach was empty.  I am CCing him to make sure he is okay with that. Follow-up in office visit with me as scheduled

## 2023-05-10 NOTE — Telephone Encounter (Signed)
Patient notified of the plan. Thanks Drs Lavon Paganini and Mansouraty for the care and attention. Pharmacy confirmed.

## 2023-05-10 NOTE — Telephone Encounter (Signed)
Happy to perform dilation at time of procedure. Thanks. GM

## 2023-05-13 ENCOUNTER — Encounter (HOSPITAL_COMMUNITY): Payer: Self-pay | Admitting: Gastroenterology

## 2023-05-20 NOTE — Anesthesia Preprocedure Evaluation (Signed)
Anesthesia Evaluation  Patient identified by MRN, date of birth, ID band Patient awake    Reviewed: Allergy & Precautions, NPO status , Patient's Chart, lab work & pertinent test results  Airway Mallampati: II  TM Distance: >3 FB Neck ROM: Full    Dental no notable dental hx. (+) Teeth Intact, Dental Advisory Given   Pulmonary neg pulmonary ROS   Pulmonary exam normal breath sounds clear to auscultation       Cardiovascular negative cardio ROS Normal cardiovascular exam Rhythm:Regular Rate:Normal  HLD   Neuro/Psych  Headaches PSYCHIATRIC DISORDERS Anxiety        GI/Hepatic Neg liver ROS, hiatal hernia,GERD  ,,  Endo/Other  diabetes, Type 2, Insulin Dependent, Oral Hypoglycemic Agents    Renal/GU negative Renal ROS  negative genitourinary   Musculoskeletal  (+) Arthritis ,    Abdominal   Peds  Hematology negative hematology ROS (+)   Anesthesia Other Findings   Reproductive/Obstetrics                             Anesthesia Physical Anesthesia Plan  ASA: 2  Anesthesia Plan: MAC   Post-op Pain Management:    Induction: Intravenous  PONV Risk Score and Plan: Propofol infusion and Treatment may vary due to age or medical condition  Airway Management Planned: Natural Airway  Additional Equipment:   Intra-op Plan:   Post-operative Plan:   Informed Consent: I have reviewed the patients History and Physical, chart, labs and discussed the procedure including the risks, benefits and alternatives for the proposed anesthesia with the patient or authorized representative who has indicated his/her understanding and acceptance.     Dental advisory given  Plan Discussed with: CRNA  Anesthesia Plan Comments:        Anesthesia Quick Evaluation

## 2023-05-21 ENCOUNTER — Ambulatory Visit (HOSPITAL_COMMUNITY)
Admission: RE | Admit: 2023-05-21 | Discharge: 2023-05-21 | Disposition: A | Discharge status: Home / Self Care | Payer: Medicare Other | Source: Ambulatory Visit | Attending: Gastroenterology | Admitting: Gastroenterology

## 2023-05-21 ENCOUNTER — Encounter (HOSPITAL_COMMUNITY): Payer: Self-pay | Admitting: Gastroenterology

## 2023-05-21 ENCOUNTER — Encounter (HOSPITAL_COMMUNITY)
Admission: RE | Disposition: A | Discharge status: Home / Self Care | Payer: Self-pay | Source: Ambulatory Visit | Attending: Gastroenterology

## 2023-05-21 ENCOUNTER — Other Ambulatory Visit: Payer: Self-pay

## 2023-05-21 ENCOUNTER — Ambulatory Visit (HOSPITAL_BASED_OUTPATIENT_CLINIC_OR_DEPARTMENT_OTHER): Payer: Self-pay | Admitting: Anesthesiology

## 2023-05-21 ENCOUNTER — Ambulatory Visit (HOSPITAL_COMMUNITY): Payer: Medicare Other | Admitting: Anesthesiology

## 2023-05-21 DIAGNOSIS — E119 Type 2 diabetes mellitus without complications: Secondary | ICD-10-CM | POA: Insufficient documentation

## 2023-05-21 DIAGNOSIS — K222 Esophageal obstruction: Secondary | ICD-10-CM | POA: Insufficient documentation

## 2023-05-21 DIAGNOSIS — Z7984 Long term (current) use of oral hypoglycemic drugs: Secondary | ICD-10-CM | POA: Diagnosis not present

## 2023-05-21 DIAGNOSIS — K2100 Gastro-esophageal reflux disease with esophagitis, without bleeding: Secondary | ICD-10-CM | POA: Insufficient documentation

## 2023-05-21 DIAGNOSIS — E785 Hyperlipidemia, unspecified: Secondary | ICD-10-CM | POA: Diagnosis not present

## 2023-05-21 DIAGNOSIS — K589 Irritable bowel syndrome without diarrhea: Secondary | ICD-10-CM | POA: Diagnosis not present

## 2023-05-21 DIAGNOSIS — E1143 Type 2 diabetes mellitus with diabetic autonomic (poly)neuropathy: Secondary | ICD-10-CM | POA: Insufficient documentation

## 2023-05-21 DIAGNOSIS — K3184 Gastroparesis: Secondary | ICD-10-CM | POA: Insufficient documentation

## 2023-05-21 DIAGNOSIS — K449 Diaphragmatic hernia without obstruction or gangrene: Secondary | ICD-10-CM | POA: Insufficient documentation

## 2023-05-21 DIAGNOSIS — K2289 Other specified disease of esophagus: Secondary | ICD-10-CM | POA: Insufficient documentation

## 2023-05-21 DIAGNOSIS — R1310 Dysphagia, unspecified: Secondary | ICD-10-CM | POA: Diagnosis present

## 2023-05-21 DIAGNOSIS — K200 Eosinophilic esophagitis: Secondary | ICD-10-CM | POA: Diagnosis not present

## 2023-05-21 DIAGNOSIS — R12 Heartburn: Secondary | ICD-10-CM | POA: Diagnosis not present

## 2023-05-21 DIAGNOSIS — K760 Fatty (change of) liver, not elsewhere classified: Secondary | ICD-10-CM | POA: Diagnosis not present

## 2023-05-21 DIAGNOSIS — Z794 Long term (current) use of insulin: Secondary | ICD-10-CM | POA: Diagnosis not present

## 2023-05-21 DIAGNOSIS — D3A8 Other benign neuroendocrine tumors: Secondary | ICD-10-CM

## 2023-05-21 DIAGNOSIS — D3A010 Benign carcinoid tumor of the duodenum: Secondary | ICD-10-CM

## 2023-05-21 DIAGNOSIS — K209 Esophagitis, unspecified without bleeding: Secondary | ICD-10-CM

## 2023-05-21 DIAGNOSIS — K219 Gastro-esophageal reflux disease without esophagitis: Secondary | ICD-10-CM

## 2023-05-21 HISTORY — PX: BIOPSY: SHX5522

## 2023-05-21 HISTORY — PX: SAVORY DILATION: SHX5439

## 2023-05-21 HISTORY — PX: EUS: SHX5427

## 2023-05-21 HISTORY — PX: ESOPHAGOGASTRODUODENOSCOPY (EGD) WITH PROPOFOL: SHX5813

## 2023-05-21 LAB — GLUCOSE, CAPILLARY: Glucose-Capillary: 106 mg/dL — ABNORMAL HIGH (ref 70–99)

## 2023-05-21 SURGERY — ESOPHAGOGASTRODUODENOSCOPY (EGD) WITH PROPOFOL
Anesthesia: Monitor Anesthesia Care

## 2023-05-21 MED ORDER — PROPOFOL 1000 MG/100ML IV EMUL
INTRAVENOUS | Status: AC
  Filled 2023-05-21: qty 100

## 2023-05-21 MED ORDER — LIDOCAINE 2% (20 MG/ML) 5 ML SYRINGE
INTRAMUSCULAR | Status: DC | PRN
  Administered 2023-05-21: 100 mg via INTRAVENOUS

## 2023-05-21 MED ORDER — PHENYLEPHRINE 80 MCG/ML (10ML) SYRINGE FOR IV PUSH (FOR BLOOD PRESSURE SUPPORT)
PREFILLED_SYRINGE | INTRAVENOUS | Status: DC | PRN
  Administered 2023-05-21 (×2): 80 ug via INTRAVENOUS

## 2023-05-21 MED ORDER — GLYCOPYRROLATE 0.2 MG/ML IJ SOLN
INTRAMUSCULAR | Status: DC | PRN
Start: 2023-05-21 — End: 2023-05-21
  Administered 2023-05-21: .1 mg via INTRAVENOUS

## 2023-05-21 MED ORDER — PROPOFOL 500 MG/50ML IV EMUL
INTRAVENOUS | Status: DC | PRN
  Administered 2023-05-21: 150 ug/kg/min via INTRAVENOUS

## 2023-05-21 MED ORDER — PROPOFOL 10 MG/ML IV BOLUS
INTRAVENOUS | Status: DC | PRN
  Administered 2023-05-21: 20 mg via INTRAVENOUS
  Administered 2023-05-21: 50 mg via INTRAVENOUS
  Administered 2023-05-21: 20 mg via INTRAVENOUS
  Administered 2023-05-21: 10 mg via INTRAVENOUS

## 2023-05-21 MED ORDER — LACTATED RINGERS IV SOLN
INTRAVENOUS | Status: DC
  Administered 2023-05-21: 1000 mL via INTRAVENOUS

## 2023-05-21 MED ORDER — SODIUM CHLORIDE 0.9 % IV SOLN: INTRAVENOUS | Status: DC

## 2023-05-21 SURGICAL SUPPLY — 15 items

## 2023-05-21 NOTE — Anesthesia Procedure Notes (Signed)
Procedure Name: MAC Date/Time: 05/21/2023 8:08 AM  Performed by: Ludwig Lean, CRNAPre-anesthesia Checklist: Patient identified, Emergency Drugs available, Suction available and Patient being monitored Patient Re-evaluated:Patient Re-evaluated prior to induction Oxygen Delivery Method: Simple face mask Placement Confirmation: positive ETCO2 and breath sounds checked- equal and bilateral

## 2023-05-21 NOTE — Transfer of Care (Signed)
Immediate Anesthesia Transfer of Care Note  Patient: Paige Hernandez  Procedure(s) Performed: Procedure(s): ESOPHAGOGASTRODUODENOSCOPY (EGD) WITH PROPOFOL (N/A) UPPER ENDOSCOPIC ULTRASOUND (EUS) RADIAL (N/A) BIOPSY SAVORY DILATION (N/A)  Patient Location: PACU  Anesthesia Type:MAC  Level of Consciousness: Patient easily awoken, sedated, comfortable, cooperative, following commands, responds to stimulation.   Airway & Oxygen Therapy: Patient spontaneously breathing, ventilating well, oxygen via simple oxygen mask.  Post-op Assessment: Report given to PACU RN, vital signs reviewed and stable, moving all extremities.   Post vital signs: Reviewed and stable.  Complications: No apparent anesthesia complications Post vital signs: Reviewed  Last Vitals:  Vitals Value Taken Time  BP 122/76 05/21/23 0849  Temp    Pulse 69 05/21/23 0850  Resp 21 05/21/23 0850  SpO2 100 % 05/21/23 0850  Vitals shown include unfiled device data.  Last Pain:  Vitals:   05/21/23 0708  TempSrc: Tympanic  PainSc: 0-No pain         Complications: No notable events documented.

## 2023-05-21 NOTE — Discharge Instructions (Signed)
YOU HAD AN ENDOSCOPIC PROCEDURE TODAY: Refer to the procedure report and other information in the discharge instructions given to you for any specific questions about what was found during the examination. If this information does not answer your questions, please call [place] office at [phone] to clarify.   YOU SHOULD EXPECT: Some feelings of bloating in the abdomen. Passage of more gas than usual. Walking can help get rid of the air that was put into your GI tract during the procedure and reduce the bloating. If you had a lower endoscopy (such as a colonoscopy or flexible sigmoidoscopy) you may notice spotting of blood in your stool or on the toilet paper. Some abdominal soreness may be present for a day or two, also.  DIET: Your first meal following the procedure should be a light meal and then it is ok to progress to your normal diet. A half-sandwich or bowl of soup is an example of a good first meal. Heavy or fried foods are harder to digest and may make you feel nauseous or bloated. Drink plenty of fluids but you should avoid alcoholic beverages for 24 hours. If you had a esophageal dilation, please see attached instructions for diet.    ACTIVITY: Your care partner should take you home directly after the procedure. You should plan to take it easy, moving slowly for the rest of the day. You can resume normal activity the day after the procedure however YOU SHOULD NOT DRIVE, use power tools, machinery or perform tasks that involve climbing or major physical exertion for 24 hours (because of the sedation medicines used during the test).   SYMPTOMS TO REPORT IMMEDIATELY: A gastroenterologist can be reached at any hour. Please call [phone]  for any of the following symptoms:   Following upper endoscopy (EGD, EUS, ERCP, esophageal dilation) Vomiting of blood or coffee ground material  New, significant abdominal pain  New, significant chest pain or pain under the shoulder blades  Painful or  persistently difficult swallowing  New shortness of breath  Black, tarry-looking or red, bloody stools  FOLLOW UP:  If any biopsies were taken you will be contacted by phone or by letter within the next 1-3 weeks. Call [phone]  if you have not heard about the biopsies in 3 weeks.  Please also call with any specific questions about appointments or follow up tests.  
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
## 2023-05-21 NOTE — Op Note (Signed)
[facility] Patient Name: Paige Hernandez Procedure Date: 05/21/2023 MRN: [id number] Attending MD: Corliss Parish , MD, [id number] Date of Birth: September 09, 1955 CSN: [id number] Age: 67 Admit Type: Outpatient Procedure:                Upper EUS Indications:              Dysphagia, Heartburn, Neuroendocrine Tumor Providers:                Corliss Parish, MD, Lorenza Evangelist, RN, Harrington Challenger, Technician Referring MD:             Napoleon Form, MD, Neta Mends. Panosh Medicines:                Monitored Anesthesia Care Complications:            No immediate complications. Estimated Blood Loss:     Estimated blood loss was minimal. Procedure:                Pre-Anesthesia Assessment:                           - Prior to the procedure, a History and Physical                            was performed, and patient medications and                            allergies were reviewed. The patient's tolerance of                            previous anesthesia was also reviewed. The risks                            and benefits of the procedure and the sedation                            options and risks were discussed with the patient.                            All questions were answered, and informed consent                            was obtained. Prior Anticoagulants: The patient has                            taken no anticoagulant or antiplatelet agents. ASA                            Grade Assessment: III - A patient with severe                            systemic disease. After reviewing the risks and  benefits, the patient was deemed in satisfactory                            condition to undergo the procedure.                           After obtaining informed consent, the endoscope was                            passed under direct vision. Throughout the                            procedure, the patient's blood  pressure, pulse, and                            oxygen saturations were monitored continuously. The                            [id number] ([id number]) Olympus therapeutic endoscope                            was introduced through the mouth, and advanced to                            the second part of duodenum. The upper EUS was                            accomplished without difficulty. The patient                            tolerated the procedure. The [id number]-[id number] ([id number])                            Olympus radial ultrasound scope was introduced                            through the mouth, and advanced to the duodenum for                            ultrasound examination from the stomach and                            duodenum. Scope In: Scope Out: Findings:      ENDOSCOPIC FINDING: :      No gross lesions were noted in the entire esophagus. After the rest of       the EGD was completed, a guidewire was placed and the scope was       withdrawn. Dilation was performed with a Savary dilator with mild       resistance at 18 mm. The dilation site was examined following endoscope       reinsertion and showed no change.      A non-obstructing Schatzki ring was found at the gastroesophageal       junction. Disruption biopsies were taken with a cold forceps for  histology.      The Z-line was irregular and was found 35 cm from the incisors.      A 3 cm hiatal hernia was present.      No gross lesions were noted in the entire examined stomach.      No gross lesions were noted in the duodenal bulb, in the first portion       of the duodenum and in the second portion of the duodenum.      ENDOSONOGRAPHIC FINDING: :      There was no sign of significant endosonographic abnormality in the       duodenal bulb, in the apex of the duodenal bulb and in the second       portion of the duodenum. No masses and no wall thickening were       identified.      Endosonographic images of the distal  stomach and pylorus were       unremarkable. No masses were identified.      No malignant-appearing lymph nodes were visualized in the gastrohepatic       ligament (level 18), celiac region (level 20) and porta hepatis region.      The celiac region was visualized. Impression:               EGD impression:                           - No gross lesions in the entire esophagus. Savory                            dilation to 18 mm performed.                           - Non-obstructing Schatzki ring. Disrupted                           - Z-line irregular, 35 cm from the incisors.                           - 3 cm hiatal hernia.                           - No gross lesions in the entire stomach.                           - No gross lesions in the duodenal bulb, in the                            first portion of the duodenum and in the second                            portion of the duodenum.                           EUS impression:                           - There was no sign of significant pathology in the  duodenal bulb, in the apex of the duodenal bulb and                            in the second portion of the duodenum.                           - Endosonographic images of the distal                            stomach/pylorus were unremarkable.                           - No malignant-appearing lymph nodes were                            visualized in the gastrohepatic ligament (level                            18), celiac region (level 20) and porta hepatis                            region. Moderate Sedation:      Not Applicable - Patient had care per Anesthesia. Recommendation:           - The patient will be observed post-procedure,                            until all discharge criteria are met.                           - Discharge patient to home.                           - Patient has a contact number available for                             emergencies. The signs and symptoms of potential                            delayed complications were discussed with the                            patient. Return to normal activities tomorrow.                            Written discharge instructions were provided to the                            patient.                           - Advance diet as tolerated.                           - Observe patient's clinical course.                           -  Follow-up pathology.                           - From a duodenal neuroendocrine tumor perspective,                            she has now had 4 years of surveillance without any                            evidence of recurrence. I think it is reasonable to                            consider 1 last dotatate scan in 2025. Pending all                            looks well at that point, then discontinuing                            surveillance does make sense. She can follow-up                            with her primary gastroenterologist at this point.                           - Query, if patient will need to consider                            restarting PPI therapy in the setting of her                            continued GERD symptoms. Should also query if there                            may be role ot [facility] repair/Fundoplication in future                            if she wants to remain off PPI.                           - Further recommendations as per primary                            gastroenterologist.                           - The findings and recommendations were discussed                            with the patient.                           - The findings and recommendations were discussed  with the designated responsible adult. Procedure Code(s):        --- Professional ---                           [phone], Esophagogastroduodenoscopy, flexible,                            transoral; with endoscopic  ultrasound examination                            limited to the esophagus, stomach or duodenum, and                            adjacent structures                           43248, Esophagogastroduodenoscopy, flexible,                            transoral; with insertion of guide wire followed by                            passage of dilator(s) through esophagus over guide                            wire                           43239, 59, Esophagogastroduodenoscopy, flexible,                            transoral; with biopsy, single or multiple Diagnosis Code(s):        --- Professional ---                           K22.2, Esophageal obstruction                           K22.89, Other specified disease of esophagus                           K44.9, Diaphragmatic hernia without obstruction or                            gangrene                           I89.9, Noninfective disorder of lymphatic vessels                            and lymph nodes, unspecified                           R13.10, Dysphagia, unspecified                           R12, Heartburn CPT copyright 2022 American Medical Association. All rights reserved. The codes documented in this report  are preliminary and upon coder review may  be revised to meet current compliance requirements. Corliss Parish, MD 05/21/2023 8:58:02 AM Number of Addenda: 0

## 2023-05-21 NOTE — Anesthesia Postprocedure Evaluation (Signed)
Anesthesia Post Note  Patient: Paige Hernandez  Procedure(s) Performed: ESOPHAGOGASTRODUODENOSCOPY (EGD) WITH PROPOFOL UPPER ENDOSCOPIC ULTRASOUND (EUS) RADIAL BIOPSY SAVORY DILATION     Patient location during evaluation: Endoscopy Anesthesia Type: MAC Level of consciousness: awake and alert Pain management: pain level controlled Vital Signs Assessment: post-procedure vital signs reviewed and stable Respiratory status: spontaneous breathing, nonlabored ventilation, respiratory function stable and patient connected to nasal cannula oxygen Cardiovascular status: blood pressure returned to baseline and stable Postop Assessment: no apparent nausea or vomiting Anesthetic complications: no  No notable events documented.  Last Vitals:  Vitals:   05/21/23 0900 05/21/23 0910  BP: 125/78 (!) 143/86  Pulse: 60 63  Resp: 17 15  Temp:    SpO2: 100% 100%    Last Pain:  Vitals:   05/21/23 0910  TempSrc:   PainSc: 1                  Nysir Fergusson L Asael Pann

## 2023-05-22 ENCOUNTER — Encounter: Payer: Self-pay | Admitting: Gastroenterology

## 2023-05-22 ENCOUNTER — Encounter: Payer: Self-pay | Admitting: Internal Medicine

## 2023-05-22 ENCOUNTER — Ambulatory Visit (INDEPENDENT_AMBULATORY_CARE_PROVIDER_SITE_OTHER): Payer: Medicare Other | Admitting: Internal Medicine

## 2023-05-22 VITALS — BP 130/70 | HR 62 | Resp 16 | Ht 63.0 in | Wt 235.4 lb

## 2023-05-22 DIAGNOSIS — E785 Hyperlipidemia, unspecified: Secondary | ICD-10-CM

## 2023-05-22 DIAGNOSIS — Z7984 Long term (current) use of oral hypoglycemic drugs: Secondary | ICD-10-CM

## 2023-05-22 DIAGNOSIS — Z780 Asymptomatic menopausal state: Secondary | ICD-10-CM

## 2023-05-22 DIAGNOSIS — E1165 Type 2 diabetes mellitus with hyperglycemia: Secondary | ICD-10-CM

## 2023-05-22 DIAGNOSIS — Z6841 Body Mass Index (BMI) 40.0 and over, adult: Secondary | ICD-10-CM

## 2023-05-22 DIAGNOSIS — M8580 Other specified disorders of bone density and structure, unspecified site: Secondary | ICD-10-CM

## 2023-05-22 DIAGNOSIS — Z794 Long term (current) use of insulin: Secondary | ICD-10-CM | POA: Diagnosis not present

## 2023-05-22 LAB — MICROALBUMIN / CREATININE URINE RATIO
Creatinine,U: 43.3 mg/dL
Microalb Creat Ratio: 1.6 mg/g (ref 0.0–30.0)
Microalb, Ur: <0.7 mg/dL (ref 0.0–1.9)

## 2023-05-22 LAB — POCT GLYCOSYLATED HEMOGLOBIN (HGB A1C): Hemoglobin A1C: 7.4 % — AB (ref 4.0–5.6)

## 2023-05-22 MED ORDER — TOUJEO SOLOSTAR 300 UNIT/ML ~~LOC~~ SOPN
PEN_INJECTOR | SUBCUTANEOUS | 3 refills | Status: DC
Start: 2023-05-22 — End: 2024-01-01

## 2023-05-22 MED ORDER — BD PEN NEEDLE NANO U/F 32G X 4 MM MISC: 3 refills | Status: AC

## 2023-05-22 MED ORDER — LYUMJEV KWIKPEN 200 UNIT/ML ~~LOC~~ SOPN: PEN_INJECTOR | SUBCUTANEOUS | 3 refills | Status: DC
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
## 2023-05-22 NOTE — Patient Instructions (Addendum)
Please continue: - Jardiance 25 mg before b'fast   Decrease: - Toujeo 54 units at bedtime - Lyumjev 18-26 units at the start of each meal (no more than 20-22 units at night)    Exercise for Strong Bones (from [facility]) There are two types of exercises that are important for building and maintaining bone density:  weight-bearing and muscle-strengthening exercises. Weight-bearing Exercises These exercises include activities that make you move against gravity while staying upright. Weight-bearing exercises can be high-impact or low-impact. High-impact weight-bearing exercises help build bones and keep them strong. If you have broken a bone due to osteoporosis or are at risk of breaking a bone, you may need to avoid high-impact exercises. If you're not sure, you should check with your healthcare provider. Examples of high-impact weight-bearing exercises are: Dancing Doing high-impact aerobics Hiking Jogging/running Jumping Rope Stair climbing Tennis Low-impact weight-bearing exercises can also help keep bones strong and are a safe alternative if you cannot do high-impact exercises. Examples of low-impact weight-bearing exercises are: Using elliptical training machines Doing low-impact aerobics Using stair-step machines Fast walking on a treadmill or outside Muscle-Strengthening Exercises These exercises include activities where you move your body, a weight or some other resistance against gravity. They are also known as resistance exercises and include: Lifting weights Using elastic exercise bands Using weight machines Lifting your own body weight Functional movements, such as standing and rising up on your toes Yoga and Pilates can also improve strength, balance and flexibility. However, certain positions may not be safe for people with osteoporosis or those at increased risk of broken bones. For example, exercises that have you bend forward may increase the  chance of breaking a bone in the spine. A physical therapist should be able to help you learn which exercises are safe and appropriate for you. Non-Impact Exercises Non-impact exercises can help you to improve balance, posture and how well you move in everyday activities. These exercises can also help to increase muscle strength and decrease the risk of falls and broken bones. Some of these exercises include: Balance exercises that strengthen your legs and test your balance, such as Tai Chi, can decrease your risk of falls. Posture exercises that improve your posture and reduce rounded or "sloping" shoulders can help you decrease the chance of breaking a bone, especially in the spine. Functional exercises that improve how well you move can help you with everyday activities and decrease your chance of falling and breaking a bone. For example, if you have trouble getting up from a chair or climbing stairs, you should do these activities as exercises. A physical therapist can teach you balance, posture and functional exercises. Starting a New Exercise Program If you haven't exercised regularly for a while, check with your healthcare provider before beginning a new exercise program--particularly if you have health problems such as heart disease, diabetes or high blood pressure. If you're at high risk of breaking a bone, you should work with a physical therapist to develop a safe exercise program. Once you have your healthcare provider's approval, start slowly. If you've already broken bones in the spine because of osteoporosis, be very careful to avoid activities that require reaching down, bending forward, rapid twisting motions, heavy lifting and those that increase your chance of a fall. As you get started, your muscles may feel sore for a day or two after you exercise. If soreness lasts longer, you may be working too hard and need to ease up. Exercises should be done in a  pain-free range of motion. How  Much Exercise Do You Need? Weight-bearing exercises 30 minutes on most days of the week. Do a 30-minutesession or multiple sessions spread out throughout the day. The benefits to your bones are the same.   Muscle-strengthening exercises Two to three days per week. If you don't have much time for strengthening/resistance training, do small amounts at a time. You can do just one body part each day. For example do arms one day, legs the next and trunk the next. You can also spread these exercises out during your normal day.  Balance, posture and functional exercises Every day or as often as needed. You may want to focus on one area more than the others. If you have fallen or lose your balance, spend time doing balance exercises. If you are getting rounded shoulders, work more on posture exercises. If you have trouble climbing stairs or getting up from the couch, do more functional exercises. You can also perform these exercises at one time or spread them during your day. Work with a phyiscal therapist to learn the right exercises for you.   Please come back for a follow-up appointment in 3-4 months.

## 2023-05-22 NOTE — Addendum Note (Signed)
Addended by: Pollie Meyer on: 05/22/2023 11:12 AM   Modules accepted: Orders

## 2023-05-23 ENCOUNTER — Ambulatory Visit (INDEPENDENT_AMBULATORY_CARE_PROVIDER_SITE_OTHER): Payer: Medicare Other | Admitting: Internal Medicine

## 2023-05-23 VITALS — BP 118/70 | HR 70 | Temp 98.0°F | Ht 63.0 in | Wt 233.2 lb

## 2023-05-23 DIAGNOSIS — Z79899 Other long term (current) drug therapy: Secondary | ICD-10-CM

## 2023-05-23 DIAGNOSIS — F4322 Adjustment disorder with anxiety: Secondary | ICD-10-CM

## 2023-05-23 DIAGNOSIS — E1165 Type 2 diabetes mellitus with hyperglycemia: Secondary | ICD-10-CM

## 2023-05-23 DIAGNOSIS — E559 Vitamin D deficiency, unspecified: Secondary | ICD-10-CM | POA: Diagnosis not present

## 2023-05-23 DIAGNOSIS — Z23 Encounter for immunization: Secondary | ICD-10-CM | POA: Diagnosis not present

## 2023-05-23 DIAGNOSIS — E785 Hyperlipidemia, unspecified: Secondary | ICD-10-CM | POA: Diagnosis not present

## 2023-05-23 DIAGNOSIS — F909 Attention-deficit hyperactivity disorder, unspecified type: Secondary | ICD-10-CM

## 2023-05-23 DIAGNOSIS — Z794 Long term (current) use of insulin: Secondary | ICD-10-CM

## 2023-05-23 LAB — LIPID PANEL
Cholesterol: 121 mg/dL (ref 0–200)
HDL: 65.7 mg/dL
LDL Cholesterol: 43 mg/dL (ref 0–99)
NonHDL: 55.55
Total CHOL/HDL Ratio: 2
Triglycerides: 65 mg/dL (ref 0.0–149.0)
VLDL: 13 mg/dL (ref 0.0–40.0)

## 2023-05-23 LAB — VITAMIN D 25 HYDROXY (VIT D DEFICIENCY, FRACTURES): VITD: 29.85 ng/mL — ABNORMAL LOW (ref 30.00–100.00)

## 2023-05-23 MED ORDER — ESCITALOPRAM OXALATE 10 MG PO TABS: 10.0000 mg | ORAL_TABLET | Freq: Every day | ORAL | 0 refills | Status: DC
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
## 2023-05-24 ENCOUNTER — Encounter (HOSPITAL_COMMUNITY): Payer: Self-pay | Admitting: Gastroenterology

## 2023-05-26 ENCOUNTER — Encounter: Payer: Self-pay | Admitting: Internal Medicine

## 2023-05-26 NOTE — Progress Notes (Signed)
Cholesterol level is now at goal , Vit d still slightly low .  Stay on atorva  Results vit d forwarding to Dr  Elvera Lennox

## 2023-05-27 LAB — LIPOPROTEIN A (LPA): Lipoprotein (a): <10 nmol/L (ref <75)

## 2023-06-03 NOTE — Progress Notes (Signed)
Lipo a is in favorable range
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
## 2023-06-25 ENCOUNTER — Encounter: Payer: Self-pay | Admitting: Gastroenterology

## 2023-06-25 ENCOUNTER — Ambulatory Visit: Payer: Medicare Other | Admitting: Gastroenterology

## 2023-06-25 ENCOUNTER — Ambulatory Visit: Payer: Medicare Other

## 2023-06-25 VITALS — BP 112/68 | HR 98 | Wt 218.0 lb

## 2023-06-25 DIAGNOSIS — K648 Other hemorrhoids: Secondary | ICD-10-CM

## 2023-06-25 DIAGNOSIS — K219 Gastro-esophageal reflux disease without esophagitis: Secondary | ICD-10-CM | POA: Diagnosis not present

## 2023-06-25 DIAGNOSIS — Z860100 Personal history of colon polyps, unspecified: Secondary | ICD-10-CM

## 2023-06-25 DIAGNOSIS — D3A8 Other benign neuroendocrine tumors: Secondary | ICD-10-CM | POA: Diagnosis not present

## 2023-06-25 DIAGNOSIS — R079 Chest pain, unspecified: Secondary | ICD-10-CM

## 2023-06-25 DIAGNOSIS — F411 Generalized anxiety disorder: Secondary | ICD-10-CM

## 2023-06-25 MED ORDER — NA SULFATE-K SULFATE-MG SULF 17.5-3.13-1.6 GM/177ML PO SOLN: ORAL | 0 refills | Status: DC

## 2023-06-25 MED ORDER — ESOMEPRAZOLE MAGNESIUM 40 MG PO CPDR: 40.0000 mg | DELAYED_RELEASE_CAPSULE | Freq: Every day | ORAL | 3 refills | Status: DC

## 2023-06-25 MED ORDER — HYDROCORTISONE ACETATE 25 MG RE SUPP: 25.0000 mg | Freq: Every evening | RECTAL | 3 refills | Status: AC | PRN

## 2023-06-25 NOTE — Patient Instructions (Addendum)
You have been scheduled for a colonoscopy. Please follow written instructions given to you at your visit today.   Please pick up your prep supplies at the pharmacy within the next 1-3 days.  If you use inhalers (even only as needed), please bring them with you on the day of your procedure.  DO NOT TAKE 7 DAYS PRIOR TO TEST- Trulicity (dulaglutide) Ozempic, Wegovy (semaglutide) Mounjaro (tirzepatide) Bydureon Bcise (exanatide extended release)  DO NOT TAKE 1 DAY PRIOR TO YOUR TEST Rybelsus (semaglutide) Adlyxin (lixisenatide) Victoza (liraglutide) Byetta (exanatide) ___________________________________________________________________________   VISIT SUMMARY:  During your visit, we discussed your history of a neuroendocrine tumor, your seasonal allergies and persistent dry cough, your recurring hemorrhoids, episodes of chest pain, and your anxiety. We also discussed your concerns about the cost of your upcoming scan and your family situation.  YOUR PLAN:  -NEUROENDOCRINE TUMOR: This is a type of cancer that starts in the cells of the neuroendocrine system. You've been cancer-free for four years following the successful removal of the tumor. We plan to do one more scan next year for surveillance. If the scan is negative, we may discontinue surveillance.  -GASTROESOPHAGEAL REFLUX DISEASE (GERD): This is a condition where stomach acid frequently flows back into the tube connecting your mouth and stomach (esophagus). This backwash (acid reflux) can irritate the lining of your esophagus. We plan to manage this with Nexium 40mg  daily for at least a month, and you can continue using Pepcid Complete and Gaviscon as needed.  -CHEST PAIN: You've been experiencing episodes of chest pain, which could be due to acid reflux, esophageal spasm, or a heart-related issue. We plan to refer you to cardiology for further evaluation and workup.  -HEMORRHOIDS: These are swollen veins in your lower rectum.  You've experienced a recurrence of symptoms. We plan to manage this with suppositories as needed.  -COLONOSCOPY: This is a test that allows your doctor to look at the inner lining of your large intestine. You're due for routine screening, and we've scheduled your colonoscopy for August 13, 2023.  -ANXIETY: This is a mental health disorder characterized by feelings of worry, anxiety, or fear that are strong enough to interfere with one's daily activities. You're currently on escitalopram (Lexapro), and we plan to continue this as prescribed.  INSTRUCTIONS:  Please continue to monitor your health and take your prescribed medications as directed. If you have any concerns or if your symptoms worsen, please contact our office immediately. We will also be scheduling your Ga-68 DOTATATE PET/CT scan for next year and your colonoscopy for August 13, 2023. We will refer you to cardiology for further evaluation of your chest pain.  I appreciate the  opportunity to care for you  Thank You   Marsa Aris , MD

## 2023-06-26 ENCOUNTER — Encounter: Payer: Self-pay | Admitting: Gastroenterology

## 2023-06-26 LAB — CHROMOGRANIN A: Chromogranin A (ng/mL): 168.7 ng/mL — ABNORMAL HIGH (ref 0.0–101.8)

## 2023-07-02 ENCOUNTER — Encounter: Payer: Self-pay | Admitting: Gastroenterology
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
## 2023-07-09 ENCOUNTER — Ambulatory Visit (INDEPENDENT_AMBULATORY_CARE_PROVIDER_SITE_OTHER): Payer: Medicare Other | Admitting: Internal Medicine

## 2023-07-09 ENCOUNTER — Encounter: Payer: Self-pay | Admitting: Internal Medicine

## 2023-07-09 VITALS — BP 112/66 | HR 66 | Temp 98.0°F | Ht 63.0 in | Wt 226.8 lb

## 2023-07-09 DIAGNOSIS — Z79899 Other long term (current) drug therapy: Secondary | ICD-10-CM | POA: Diagnosis not present

## 2023-07-09 DIAGNOSIS — F4322 Adjustment disorder with anxiety: Secondary | ICD-10-CM | POA: Diagnosis not present

## 2023-07-09 DIAGNOSIS — E785 Hyperlipidemia, unspecified: Secondary | ICD-10-CM | POA: Diagnosis not present

## 2023-07-09 MED ORDER — ATORVASTATIN CALCIUM 40 MG PO TABS: 40.0000 mg | ORAL_TABLET | Freq: Every day | ORAL | 3 refills | Status: AC

## 2023-07-09 NOTE — Patient Instructions (Addendum)
Stay on lexapro as planned  Plan fu  virtual or other in 3-4 months . Refilled   atorvastatin   today .

## 2023-08-06 ENCOUNTER — Telehealth: Payer: Self-pay | Admitting: Gastroenterology

## 2023-08-06 NOTE — Telephone Encounter (Signed)
Patient called and stated that during this season she is having conjunctivitis and they prescribed her Lotemaxsm 0.38 mg 1 drop in both eyes 2x daily. They also prescribed her oral antibiotics as well which she stated she has not taken yet. Patient is wanting to know if it was okay to take even though her procedure is Dec 10th. Patient is requesting a call back. Please advise.

## 2023-08-07 ENCOUNTER — Other Ambulatory Visit: Payer: Self-pay

## 2023-08-07 NOTE — Telephone Encounter (Signed)
Spoke with the patient. She is dealing with infection and irritation of her eye. The antibiotics are for this infection. She does not have any URI symptoms or fever. Advised she is okay to go forward with her colonoscopy as scheduled.

## 2023-08-13 ENCOUNTER — Ambulatory Visit: Payer: Medicare Other | Admitting: Gastroenterology

## 2023-08-13 ENCOUNTER — Encounter: Payer: Self-pay | Admitting: Gastroenterology

## 2023-08-13 VITALS — BP 112/68 | HR 72 | Temp 98.0°F | Resp 15 | Ht 63.5 in | Wt 218.0 lb

## 2023-08-13 DIAGNOSIS — D122 Benign neoplasm of ascending colon: Secondary | ICD-10-CM

## 2023-08-13 DIAGNOSIS — D123 Benign neoplasm of transverse colon: Secondary | ICD-10-CM

## 2023-08-13 DIAGNOSIS — K5730 Diverticulosis of large intestine without perforation or abscess without bleeding: Secondary | ICD-10-CM | POA: Diagnosis not present

## 2023-08-13 DIAGNOSIS — K648 Other hemorrhoids: Secondary | ICD-10-CM

## 2023-08-13 DIAGNOSIS — K644 Residual hemorrhoidal skin tags: Secondary | ICD-10-CM

## 2023-08-13 DIAGNOSIS — Z1211 Encounter for screening for malignant neoplasm of colon: Secondary | ICD-10-CM

## 2023-08-13 DIAGNOSIS — Z860100 Personal history of colon polyps, unspecified: Secondary | ICD-10-CM

## 2023-08-13 DIAGNOSIS — K55049 Acute infarction of large intestine, extent unspecified: Secondary | ICD-10-CM

## 2023-08-13 MED ORDER — SODIUM CHLORIDE 0.9 % IV SOLN: 500.0000 mL | Freq: Once | INTRAVENOUS | Status: DC

## 2023-08-13 NOTE — Op Note (Addendum)
[facility] Patient Name: Paige Hernandez Procedure Date: 08/13/2023 11:42 AM MRN: [id number] Endoscopist: Napoleon Form , MD, [id number] Age: 67 Referring MD:  Date of Birth: Aug 13, 1956 Gender: Female Account #: [id number] Procedure:                Colonoscopy Indications:              High risk colon cancer surveillance: Personal                            history of colonic polyps Medicines:                Monitored Anesthesia Care Procedure:                Pre-Anesthesia Assessment:                           - Prior to the procedure, a History and Physical                            was performed, and patient medications and                            allergies were reviewed. The patient's tolerance of                            previous anesthesia was also reviewed. The risks                            and benefits of the procedure and the sedation                            options and risks were discussed with the patient.                            All questions were answered, and informed consent                            was obtained. Prior Anticoagulants: The patient has                            taken no anticoagulant or antiplatelet agents. ASA                            Grade Assessment: II - A patient with mild systemic                            disease. After reviewing the risks and benefits,                            the patient was deemed in satisfactory condition to                            undergo the procedure.  After obtaining informed consent, the colonoscope                            was passed under direct vision. Throughout the                            procedure, the patient's blood pressure, pulse, and                            oxygen saturations were monitored continuously. The                            [id number]-HQ190L Colonoscope [id number] was introduced                            through the anus and advanced to  the the cecum,                            identified by appendiceal orifice and ileocecal                            valve. The colonoscopy was performed without                            difficulty. The patient tolerated the procedure                            well. The quality of the bowel preparation was                            good. The ileocecal valve, appendiceal orifice, and                            rectum were photographed. Scope In: 11:49:19 AM Scope Out: 12:01:55 PM Scope Withdrawal Time: 0 hours 9 minutes 0 seconds  Total Procedure Duration: 0 hours 12 minutes 36 seconds  Findings:                 The perianal and digital rectal examinations were                            normal.                           Two sessile polyps were found in the transverse                            colon and ascending colon. The polyps were 1 to 2                            mm in size. These polyps were removed with a cold                            biopsy forceps. Resection and retrieval were  complete.                           Scattered small-mouthed diverticula were found in                            the sigmoid colon and descending colon.                           Non-bleeding external and internal hemorrhoids were                            found during retroflexion. The hemorrhoids were                            medium-sized. Complications:            No immediate complications. Estimated Blood Loss:     Estimated blood loss was minimal. Impression:               - Two 1 to 2 mm polyps in the transverse colon and                            in the ascending colon, removed with a cold biopsy                            forceps. Resected and retrieved.                           - Diverticulosis in the sigmoid colon and in the                            descending colon.                           - Non-bleeding external and internal  hemorrhoids. Recommendation:           - Patient has a contact number available for                            emergencies. The signs and symptoms of potential                            delayed complications were discussed with the                            patient. Return to normal activities tomorrow.                            Written discharge instructions were provided to the                            patient.                           - Resume previous diet.                           -  Continue present medications.                           - Await pathology results.                           - Repeat colonoscopy in 5 years for surveillance                            based on pathology results.                           - Return to GI clinic at the next available                            appointment for hemorrhoid band ligation. Napoleon Form, MD 08/13/2023 12:09:50 PM This report has been signed electronically.

## 2023-08-13 NOTE — Progress Notes (Signed)
Called to room to assist during endoscopic procedure.  Patient ID and intended procedure confirmed with present staff. Received instructions for my participation in the procedure from the performing physician.  
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
## 2023-08-13 NOTE — Patient Instructions (Addendum)
Resume previous diet. Continue present medications. Await pathology results.   YOU HAD AN ENDOSCOPIC PROCEDURE TODAY AT THE [facility]:   Refer to the procedure report that was given to you for any specific questions about what was found during the examination.  If the procedure report does not answer your questions, please call your gastroenterologist to clarify.  If you requested that your care partner not be given the details of your procedure findings, then the procedure report has been included in a sealed envelope for you to review at your convenience later.  YOU SHOULD EXPECT: Some feelings of bloating in the abdomen. Passage of more gas than usual.  Walking can help get rid of the air that was put into your GI tract during the procedure and reduce the bloating. If you had a lower endoscopy (such as a colonoscopy or flexible sigmoidoscopy) you may notice spotting of blood in your stool or on the toilet paper. If you underwent a bowel prep for your procedure, you may not have a normal bowel movement for a few days.  Please Note:  You might notice some irritation and congestion in your nose or some drainage.  This is from the oxygen used during your procedure.  There is no need for concern and it should clear up in a day or so.  SYMPTOMS TO REPORT IMMEDIATELY:  Following lower endoscopy (colonoscopy or flexible sigmoidoscopy):  Excessive amounts of blood in the stool  Significant tenderness or worsening of abdominal pains  Swelling of the abdomen that is new, acute  Fever of 100F or higher   For urgent or emergent issues, a gastroenterologist can be reached at any hour by calling [phone]. Do not use MyChart messaging for urgent concerns.    DIET:  We do recommend a small meal at first, but then you may proceed to your regular diet.  Drink plenty of fluids but you should avoid alcoholic beverages for 24 hours.  ACTIVITY:  You should plan to take it easy for the  rest of today and you should NOT DRIVE or use heavy machinery until tomorrow (because of the sedation medicines used during the test).    FOLLOW UP: Our staff will call the number listed on your records the next business day following your procedure.  We will call around 7:15- 8:00 am to check on you and address any questions or concerns that you may have regarding the information given to you following your procedure. If we do not reach you, we will leave a message.     If any biopsies were taken you will be contacted by phone or by letter within the next 1-3 weeks.  Please call us at [phone] if you have not heard about the biopsies in 3 weeks.    SIGNATURES/CONFIDENTIALITY: You and/or your care partner have signed paperwork which will be entered into your electronic medical record.  These signatures attest to the fact that that the information above on your After Visit Summary has been reviewed and is understood.  Full responsibility of the confidentiality of this discharge information lies with you and/or your care-partner. 

## 2023-08-13 NOTE — Progress Notes (Unsigned)
Sedate, gd SR, tolerated procedure well, VSS, report to RN 

## 2023-08-14 ENCOUNTER — Encounter: Payer: Self-pay | Admitting: Gastroenterology

## 2023-08-14 ENCOUNTER — Telehealth: Payer: Self-pay

## 2023-08-14 NOTE — Telephone Encounter (Signed)
Left message on follow up call. 

## 2023-08-15 LAB — SURGICAL PATHOLOGY

## 2023-09-11 ENCOUNTER — Encounter: Payer: Self-pay | Admitting: Gastroenterology

## 2023-09-19 ENCOUNTER — Other Ambulatory Visit: Payer: Self-pay | Admitting: Internal Medicine

## 2023-09-19 NOTE — Telephone Encounter (Unsigned)
Copied from CRM [id number]. Topic: Clinical - Medication Refill >> Sep 19, 2023 11:10 AM Drema Balzarine wrote: Most Recent Primary Care Visit:  Provider: Madelin Headings  Department: [facility]  Visit Type: OFFICE VISIT  Date: 07/09/2023  Medication: ***  Has the patient contacted their pharmacy? {yes/no:20286} (Agent: If no, request that the patient contact the pharmacy for the refill. If patient does not wish to contact the pharmacy document the reason why and proceed with request.) (Agent: If yes, when and what did the pharmacy advise?)  Is this the correct pharmacy for this prescription? {yes/no:20286} If no, delete pharmacy and type the correct one.  This is the patient's preferred pharmacy:  [facility] [place], [place] - [address] [facility] [address] [place] [address] Phone: [phone] Fax: [phone]  [facility] [place] (New Address) - [place], [place] - [address] [facility] AT Previously: [address], [place] [address] [facility] [address] [facility] [address] [facility] [address] [place] [address] Phone: [phone] Fax: [phone]  [facility] [address], [place] - [address] [place] [address] Phone: [phone] Fax: [phone]  [facility] [address], [place] - [address] [place] [address] Phone: [phone] Fax: [phone]   Has the prescription been filled recently? {yes/no:20286}  Is the patient out of the medication? {yes/no:20286}  Has the patient been seen for an appointment in the last year OR does the patient have an upcoming appointment? {yes/no:20286}  Can we respond through MyChart? {yes/no:20286}  Agent: Please be advised that Rx refills may take up to 3 business days. We ask that you follow-up with your pharmacy.

## 2023-09-19 NOTE — Telephone Encounter (Unsigned)
Copied from CRM [id number]. Topic: Clinical - Medication Refill >> Sep 19, 2023 11:10 AM Drema Balzarine wrote: Most Recent Primary Care Visit:  Provider: Berniece Andreas K  Department: [facility]  Visit Type: OFFICE VISIT  Date: 07/09/2023  Medication: Esomeprazole (NEXIUM) 40 MG capsule  Has the patient contacted their pharmacy? Yes (Agent: If no, request that the patient contact the pharmacy for the refill. If patient does not wish to contact the pharmacy document the reason why and proceed with request.) (Agent: If yes, when and what did the pharmacy advise?)  Is this the correct pharmacy for this prescription? Yes If no, delete pharmacy and type the correct one.  This is the patient's preferred pharmacy:    [facility] [address] [place] - [address] [place] [address] Phone: [phone] Fax: [phone]   Has the prescription been filled recently? Yes  Is the patient out of the medication? No  Has the patient been seen for an appointment in the last year OR does the patient have an upcoming appointment? Yes  Can we respond through MyChart? Yes  Agent: Please be advised that Rx refills may take up to 3 business days. We ask that you follow-up with your pharmacy.

## 2023-09-23 MED ORDER — ESOMEPRAZOLE MAGNESIUM 40 MG PO CPDR: 40.0000 mg | DELAYED_RELEASE_CAPSULE | Freq: Every day | ORAL | 3 refills | Status: DC

## 2023-09-24 ENCOUNTER — Encounter: Payer: Self-pay | Admitting: Internal Medicine

## 2023-09-24 ENCOUNTER — Ambulatory Visit: Payer: Medicare Other | Attending: Internal Medicine | Admitting: Internal Medicine

## 2023-09-24 ENCOUNTER — Ambulatory Visit: Payer: Medicare Other | Admitting: Internal Medicine

## 2023-09-24 VITALS — BP 126/80 | HR 71 | Ht 63.0 in | Wt 229.6 lb

## 2023-09-24 DIAGNOSIS — D3A8 Other benign neuroendocrine tumors: Secondary | ICD-10-CM | POA: Insufficient documentation

## 2023-09-24 DIAGNOSIS — E1165 Type 2 diabetes mellitus with hyperglycemia: Secondary | ICD-10-CM | POA: Diagnosis present

## 2023-09-24 DIAGNOSIS — I2510 Atherosclerotic heart disease of native coronary artery without angina pectoris: Secondary | ICD-10-CM | POA: Insufficient documentation

## 2023-09-24 DIAGNOSIS — R079 Chest pain, unspecified: Secondary | ICD-10-CM | POA: Diagnosis not present

## 2023-09-24 DIAGNOSIS — K219 Gastro-esophageal reflux disease without esophagitis: Secondary | ICD-10-CM | POA: Diagnosis present

## 2023-09-24 DIAGNOSIS — E785 Hyperlipidemia, unspecified: Secondary | ICD-10-CM | POA: Insufficient documentation

## 2023-09-24 DIAGNOSIS — Z794 Long term (current) use of insulin: Secondary | ICD-10-CM | POA: Insufficient documentation

## 2023-09-24 DIAGNOSIS — R072 Precordial pain: Secondary | ICD-10-CM | POA: Insufficient documentation

## 2023-09-24 DIAGNOSIS — Z01812 Encounter for preprocedural laboratory examination: Secondary | ICD-10-CM | POA: Diagnosis present

## 2023-09-24 DIAGNOSIS — D3A010 Benign carcinoid tumor of the duodenum: Secondary | ICD-10-CM | POA: Diagnosis present

## 2023-09-24 LAB — BASIC METABOLIC PANEL
BUN/Creatinine Ratio: 21 (ref 12–28)
BUN: 20 mg/dL (ref 8–27)
CO2: 24 mmol/L (ref 20–29)
Calcium: 9.6 mg/dL (ref 8.7–10.3)
Chloride: 104 mmol/L (ref 96–106)
Creatinine, Ser: 0.97 mg/dL (ref 0.57–1.00)
Glucose: 174 mg/dL — ABNORMAL HIGH (ref 70–99)
Potassium: 5.1 mmol/L (ref 3.5–5.2)
Sodium: 143 mmol/L (ref 134–144)
eGFR: 63 mL/min/{1.73_m2} (ref >59)

## 2023-09-24 MED ORDER — METOPROLOL TARTRATE 100 MG PO TABS: 100.0000 mg | ORAL_TABLET | Freq: Once | ORAL | 0 refills | Status: DC

## 2023-09-24 NOTE — Progress Notes (Signed)
Cardiology Office Note:  .   Date:  09/24/2023  ID:  Paige Hernandez, DOB 1956-04-02, MRN [id number] PCP: Madelin Headings, MD  [facility] Providers Cardiologist:  Parke Poisson, MD    History of Present Illness: .   Paige Hernandez is a 68 y.o. female.  Discussed the use of AI scribe software for clinical note transcription with the patient, who gave verbal consent to proceed.  History of Present Illness   A 68 year old patient with a history of anemia, diabetes, fatty liver, GERD, and hyperlipidemia presents for evaluation of chest pain. 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 The patient reports experiencing episodes of chest pain, which she initially managed with over-the-counter medications for GERD. The episodes were not associated with eating and occurred at various times. The patient describes the pain as "crushing" and lasting for about 10 hours during the first episode. The pain was also associated with numbness in the left arm, which the patient attributes to existing nerve impingement. The patient reports no other symptoms indicative of a heart attack, such as changes in respiration or sinus rhythm. The patient has not experienced any episodes of chest pain since her last endoscopy in September, and has gone back on her PPI which she was previously off of per GI.        ROS: negative except per HPI above.  Studies Reviewed: Marland Kitchen   EKG Interpretation Date/Time:  Tuesday September 24 2023 08:34:37 EST Ventricular Rate:  71 PR Interval:  126 QRS Duration:  82 QT Interval:  380 QTC Calculation: 412 R Axis:   -11  Text Interpretation: Normal sinus rhythm Normal ECG Confirmed by Weston Brass ([id number]) on 09/24/2023 8:37:23 AM    Results   LABS LDL: 43 mg/dL (69/6295) Triglycerides: 65 mg/dL (28/4132) HDL: 65 mg/dL (44/0102)  RADIOLOGY PET  CT scan: Mild arterial calcification LAD (2023)  DIAGNOSTIC EKG: Normal (09/24/2023)     Risk Assessment/Calculations:             Physical Exam:   VS:  BP 126/80 (BP Location: Left Arm, Patient Position: Sitting, Cuff Size: Large)   Pulse 71   Ht 5\' 3"  (1.6 m)   Wt 229 lb 9.6 oz (104.1 kg)   BMI 40.67 kg/m    Wt Readings from Last 3 Encounters:  09/24/23 229 lb 9.6 oz (104.1 kg)  08/13/23 218 lb (98.9 kg)  07/09/23 226 lb 12.8 oz (102.9 kg)     Physical Exam   VITALS: P- 71 CHEST: Lungs clear to auscultation. CARDIOVASCULAR: Heart sounds normal, no murmurs detected. EXTREMITIES: trace puffy noted.     GEN: Well nourished, well developed in no acute distress NECK: No JVD; No carotid bruits CARDIAC: RRR, no murmurs, rubs, gallops RESPIRATORY:  Clear to auscultation without rales, wheezing or rhonchi  ABDOMEN: Soft, non-tender, non-distended EXTREMITIES:  No edema; No deformity   ASSESSMENT AND PLAN: .    Assessment & Plan Hyperlipidemia, unspecified hyperlipidemia type  Chest pain, unspecified type  Pre-procedure lab exam  Precordial chest pain  Coronary artery calcification  Gastroesophageal reflux disease, unspecified whether esophagitis present  Benign carcinoid tumor of duodenum  Type 2 diabetes mellitus with hyperglycemia, with long-term current use of insulin (HCC)  Neuroendocrine tumor   Assessment and Plan    Chest Pain CAC History of episodes of chest pain, most recent in May of last year. Pain  described as crushing, radiating to left arm, lasting for several hours. Patient has a history of GERD and believes the pain may be related. No recent episodes since esophageal dilation in September. Normal EKG today. -Order echocardiogram to assess cardiac function and potential damage. -Order coronary CT scan to assess for coronary artery disease and plaque characterization. -One-time dose of Metoprolol prior to CT scan to optimize heart rate for  imaging. -Follow-up appointment on March 19th to discuss results.  Hyperlipidemia Currently managed with Atorvastatin 40mg  daily. Recent labs from September show excellent control with LDL at 43, triglycerides at 65, and HDL at 65. -Continue Atorvastatin 40mg  daily.  GERD History of esophageal stricture requiring dilation. Recent endoscopy in September with dilation performed. No recent episodes of chest pain since dilation. -Continue Nexium as prescribed by gastroenterologist.  Diabetes Patient on Jardiance and reports frequent urination. No mention of glycemic control in the conversation. -Continue current diabetes management.  Peripheral Neuropathy Patient reports decreased sensation in feet, likely secondary to diabetes. -No changes to management discussed in the conversation.                I spent 45 minutes in the care of Rettie Barreto Dolinger today including reviewing labs (05/23/23), reviewing studies (PET 2023), face to face time discussing treatment options (35), and documenting in the encounter.

## 2023-09-24 NOTE — Patient Instructions (Addendum)
Medication Instructions:  Your physician recommends that you continue on your current medications as directed. Please refer to the Current Medication list given to you today.   *If you need a refill on your cardiac medications before your next appointment, please call your pharmacy*   Lab Work: BMET    If you have labs (blood work) drawn today and your tests are completely normal, you will receive your results only by: MyChart Message (if you have MyChart) OR A paper copy in the mail If you have any lab test that is abnormal or we need to change your treatment, we will call you to review the results.   Testing/Procedures: Echo will be scheduled at [address].  Your physician has requested that you have an echocardiogram. Echocardiography is a painless test that uses sound waves to create images of your heart. It provides your doctor with information about the size and shape of your heart and how well your heart's chambers and valves are working. This procedure takes approximately one hour. There are no restrictions for this procedure. Please do NOT wear cologne, perfume, aftershave, or lotions (deodorant is allowed). Please arrive 15 minutes prior to your appointment time.     Your cardiac CT will be scheduled at one of the below locations:   [facility] [address] [place], [place] [address] [phone]   If scheduled at [facility], please arrive at the [facility] and Children's Entrance (Entrance C2) of [facility] 30 minutes prior to test start time. You can use the FREE valet parking offered at entrance C (encouraged to control the heart rate for the test)  Proceed to the [facility] Radiology Department (first floor) to check-in and test prep.  All radiology patients and guests should use entrance C2 at [facility], accessed from [facility], even though the hospital's physical address listed is [address].     Please follow these instructions carefully (unless otherwise directed):   On the Night Before the Test: Be sure to Drink plenty of water. Do not consume any caffeinated/decaffeinated beverages or chocolate 12 hours prior to your test. Do not take any antihistamines 12 hours prior to your test.   On the Day of the Test: Drink plenty of water until 1 hour prior to the test. Do not eat any food 1 hour prior to test. You may take your regular medications prior to the test.  Take metoprolol (Lopressor) two hours prior to test. If you take Furosemide/Hydrochlorothiazide/Spironolactone/Chlorthalidone, please HOLD on the morning of the test. Patients who wear a continuous glucose monitor MUST remove the device prior to scanning. FEMALES- please wear underwire-free bra if available, avoid dresses & tight clothing       After the Test: Drink plenty of water. After receiving IV contrast, you may experience a mild flushed feeling. This is normal. On occasion, you may experience a mild rash up to 24 hours after the test. This is not dangerous. If this occurs, you can take Benadryl 25 mg and increase your fluid intake. If you experience trouble breathing, this can be serious. If it is severe call 911 IMMEDIATELY. If it is mild, please call our office.  We will call to schedule your test 2-4 weeks out understanding that some insurance companies will need an authorization prior to the service being performed.   For more information and frequently asked questions, please visit our website : [email or web address]  For non-scheduling  related questions, please contact the cardiac imaging nurse navigator should you have any questions/concerns: Cardiac Imaging Nurse Navigators Direct Office Dial: [phone]   For scheduling needs, including cancellations and rescheduling, please call [place], [phone].    Follow-Up: At [facility], you and your health  needs are our priority.  As part of our continuing mission to provide you with exceptional heart care, we have created designated Provider Care Teams.  These Care Teams include your primary Cardiologist (physician) and Advanced Practice Providers (APPs -  Physician Assistants and Nurse Practitioners) who all work together to provide you with the care you need, when you need it.  We recommend signing up for the patient portal called "MyChart".  Sign up information is provided on this After Visit Summary.  MyChart is used to connect with patients for Virtual Visits (Telemedicine).  Patients are able to view lab/test results, encounter notes, upcoming appointments, etc.  Non-urgent messages can be sent to your provider as well.   To learn more about what you can do with MyChart, go to [email or web address].    Your next appointment:   [facility] November 20, 2023 AT 10:00AM   The format for your next appointment:   In Person  Provider:   Parke Poisson, MD    Other Instructions

## 2023-09-25 ENCOUNTER — Encounter: Payer: Self-pay | Admitting: Gastroenterology

## 2023-09-26 ENCOUNTER — Encounter: Payer: Self-pay | Admitting: *Deleted

## 2023-10-02 ENCOUNTER — Telehealth: Payer: Self-pay

## 2023-10-02 MED ORDER — ESCITALOPRAM OXALATE 10 MG PO TABS: 10.0000 mg | ORAL_TABLET | Freq: Every day | ORAL | 0 refills | Status: DC

## 2023-10-02 NOTE — Telephone Encounter (Signed)
Rx sent

## 2023-10-02 NOTE — Telephone Encounter (Signed)
Spoke to pt. Inform pt that Rx is sent. Pt is aware and states she will pick it up this evening.

## 2023-10-02 NOTE — Telephone Encounter (Signed)
Copied from CRM [id number]. Topic: Clinical - Medication Refill >> Oct 02, 2023 10:47 AM Presley Raddle C wrote: Pt called on 09/18/22 to request for a refill for escitalopram (LEXAPRO) 10 MG tablet. The crm was created but the message was not addressed. She stated that she is completely out of the medication. Please call and advise.    [employer] [address] [place] - [address] [place] [address] Phone: [phone] Fax: [phone]

## 2023-10-03 ENCOUNTER — Ambulatory Visit: Payer: Medicare Other | Admitting: Internal Medicine
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
## 2023-10-15 ENCOUNTER — Encounter (HOSPITAL_COMMUNITY): Payer: Self-pay

## 2023-10-16 ENCOUNTER — Telehealth: Payer: Self-pay | Admitting: Internal Medicine

## 2023-10-16 NOTE — Telephone Encounter (Signed)
Spoke to patient regarding Prior Auth Approval for CTa scheduled for 10/17/2023 at 9am.  We were able to confirm that it has been approved, after reaching out to Clinical Supervisor, Bernette Redbird.  Pt was informed of approval and general CTa questions were answered.  Pt verbalized understanding, very appreciative of [facility] providers, especially Dr. Jacques Navy.  No further concerns.

## 2023-10-16 NOTE — Telephone Encounter (Signed)
Pt is requesting a callback regarding the pre authorization for her CT tomorrow. She'd like to know the status of it. Please advise

## 2023-10-17 ENCOUNTER — Encounter: Payer: Self-pay | Admitting: Internal Medicine

## 2023-10-17 ENCOUNTER — Ambulatory Visit (HOSPITAL_COMMUNITY)
Admission: RE | Admit: 2023-10-17 | Discharge: 2023-10-17 | Disposition: A | Discharge status: Home / Self Care | Payer: Medicare Other | Source: Ambulatory Visit | Attending: Internal Medicine | Admitting: Internal Medicine

## 2023-10-17 ENCOUNTER — Ambulatory Visit: Payer: Medicare Other | Admitting: Internal Medicine

## 2023-10-17 VITALS — BP 120/78 | HR 72 | Ht 63.0 in | Wt 234.6 lb

## 2023-10-17 DIAGNOSIS — R079 Chest pain, unspecified: Secondary | ICD-10-CM | POA: Diagnosis present

## 2023-10-17 DIAGNOSIS — Z794 Long term (current) use of insulin: Secondary | ICD-10-CM

## 2023-10-17 DIAGNOSIS — E1165 Type 2 diabetes mellitus with hyperglycemia: Secondary | ICD-10-CM | POA: Diagnosis not present

## 2023-10-17 DIAGNOSIS — E66813 Obesity, class 3: Secondary | ICD-10-CM | POA: Diagnosis not present

## 2023-10-17 DIAGNOSIS — R072 Precordial pain: Secondary | ICD-10-CM | POA: Insufficient documentation

## 2023-10-17 DIAGNOSIS — Z6841 Body Mass Index (BMI) 40.0 and over, adult: Secondary | ICD-10-CM

## 2023-10-17 DIAGNOSIS — E785 Hyperlipidemia, unspecified: Secondary | ICD-10-CM | POA: Diagnosis not present

## 2023-10-17 LAB — POCT GLYCOSYLATED HEMOGLOBIN (HGB A1C): Hemoglobin A1C: 7.3 % — AB (ref 4.0–5.6)

## 2023-10-17 MED ORDER — NITROGLYCERIN 0.4 MG SL SUBL
0.8000 mg | SUBLINGUAL_TABLET | Freq: Once | SUBLINGUAL | Status: AC
  Administered 2023-10-17: 0.8 mg via SUBLINGUAL

## 2023-10-17 MED ORDER — METOPROLOL TARTRATE 5 MG/5ML IV SOLN: 10.0000 mg | Freq: Once | INTRAVENOUS | Status: DC | PRN

## 2023-10-17 MED ORDER — IOHEXOL 350 MG/ML SOLN
95.0000 mL | Freq: Once | INTRAVENOUS | Status: AC | PRN
  Administered 2023-10-17: 95 mL via INTRAVENOUS

## 2023-10-17 MED ORDER — NITROGLYCERIN 0.4 MG SL SUBL
SUBLINGUAL_TABLET | SUBLINGUAL | Status: AC
  Filled 2023-10-17: qty 2

## 2023-10-17 MED ORDER — DILTIAZEM HCL 25 MG/5ML IV SOLN: 10.0000 mg | INTRAVENOUS | Status: DC | PRN

## 2023-10-17 NOTE — Addendum Note (Signed)
Addended by: Pollie Meyer on: 10/17/2023 02:12 PM   Modules accepted: Orders
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
## 2023-10-17 NOTE — Patient Instructions (Addendum)
Please continue: - Jardiance 25 mg before b'fast  - Toujeo 50 units at bedtime - Lyumjev 18-22 units before meals   Please come back for a follow-up appointment in 4 months.
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
## 2023-11-06 ENCOUNTER — Ambulatory Visit: Payer: Medicare Other | Admitting: Internal Medicine

## 2023-11-06 VITALS — BP 108/64 | HR 87 | Temp 98.0°F | Ht 63.0 in | Wt 237.4 lb

## 2023-11-06 DIAGNOSIS — Z79899 Other long term (current) drug therapy: Secondary | ICD-10-CM

## 2023-11-06 DIAGNOSIS — F4322 Adjustment disorder with anxiety: Secondary | ICD-10-CM

## 2023-11-06 NOTE — Patient Instructions (Addendum)
 Good to see you today . Stay on same medication .  Good attitude.   Plan rov in 6 months  You can do AWV with nurse or other provider   . But  It appears  you are up to day  Except tdap  Last in 2014

## 2023-11-13 ENCOUNTER — Ambulatory Visit (HOSPITAL_COMMUNITY): Payer: Medicare Other | Attending: Cardiology

## 2023-11-13 DIAGNOSIS — R079 Chest pain, unspecified: Secondary | ICD-10-CM | POA: Insufficient documentation

## 2023-11-13 LAB — ECHOCARDIOGRAM COMPLETE
Area-P 1/2: 4.31 cm2
S' Lateral: 2.6 cm

## 2023-11-14 ENCOUNTER — Encounter: Payer: Self-pay | Admitting: *Deleted

## 2023-11-20 ENCOUNTER — Ambulatory Visit: Payer: Medicare Other | Attending: Internal Medicine | Admitting: Internal Medicine

## 2023-11-20 VITALS — BP 120/80 | HR 80 | Ht 63.0 in | Wt 238.0 lb

## 2023-11-20 DIAGNOSIS — K219 Gastro-esophageal reflux disease without esophagitis: Secondary | ICD-10-CM | POA: Diagnosis present

## 2023-11-20 DIAGNOSIS — I2510 Atherosclerotic heart disease of native coronary artery without angina pectoris: Secondary | ICD-10-CM | POA: Diagnosis present

## 2023-11-20 DIAGNOSIS — R079 Chest pain, unspecified: Secondary | ICD-10-CM

## 2023-11-20 DIAGNOSIS — E785 Hyperlipidemia, unspecified: Secondary | ICD-10-CM | POA: Diagnosis present

## 2023-11-20 DIAGNOSIS — E1165 Type 2 diabetes mellitus with hyperglycemia: Secondary | ICD-10-CM | POA: Diagnosis present

## 2023-11-20 DIAGNOSIS — Z794 Long term (current) use of insulin: Secondary | ICD-10-CM

## 2023-11-20 NOTE — Progress Notes (Signed)
  Cardiology Office Note:  .   Date:  11/20/2023  ID:  Paige Hernandez, DOB 1956/01/26, MRN [id number] PCP: Madelin Headings, MD  [facility] Providers Cardiologist:  Parke Poisson, MD    History of Present Illness: .   Paige Hernandez is a 68 y.o. female anemia, diabetes, fatty liver, GERD, and hyperlipidemia presents for f/u of chest pain.  Back injury 5 weeks ago. Left lower back. Improving with conservative measures, 90% better.   Climbed 6 flights of stairs to appt and no chest pain. Mild SOB - resolved in 2 minutes.   2 x esophageal dilation previously. Prior duodenal cancer.   ROS: per hpi  Studies Reviewed: .         Risk Assessment/Calculations:      Physical Exam:   VS:  BP 120/80 (BP Location: Left Arm, Patient Position: Sitting, Cuff Size: Large)   Pulse 80   Ht 5\' 3"  (1.6 m)   Wt 238 lb (108 kg)   BMI 42.16 kg/m    Wt Readings from Last 3 Encounters:  11/20/23 238 lb (108 kg)  11/06/23 237 lb 6.4 oz (107.7 kg)  10/17/23 234 lb 9.6 oz (106.4 kg)    GEN: Well nourished, well developed in no acute distress NECK: No JVD; No carotid bruits CARDIAC: RRR, no murmurs, rubs, gallops RESPIRATORY:  Clear to auscultation without rales, wheezing or rhonchi  ABDOMEN: Soft, non-tender, non-distended EXTREMITIES:  No edema; No deformity   ASSESSMENT AND PLAN: .   Chest Pain CAC History of episodes of chest pain, most recent in May of last year. Pain described as crushing, radiating to left arm, lasting for several hours. Patient has a history of GERD and believes the pain may be related.  -echocardiogram normal -coronary CT scan with minimal Cad and low calcium burden 12.8 cal score. - continue atorvastatin 40 mg daily.    Hyperlipidemia Currently managed with Atorvastatin 40mg  daily. Recent labs from September show excellent control with LDL at 43, triglycerides at 65, and HDL at 65. -Continue Atorvastatin 40mg  daily.   GERD History of esophageal  stricture requiring dilation. Recent endoscopy in September with dilation performed. No recent episodes of chest pain since dilation. -Continue Nexium 40 mg dialy as prescribed by gastroenterologist.   Diabetes Patient on Jardiance and reports frequent urination. HbA1c 7.3%, continue lifestyle measures.         Patient Instructions  Medication Instructions:  No Changes  Lab Work: None  Follow-Up: At [facility], you and your health needs are our priority.  As part of our continuing mission to provide you with exceptional heart care, we have created designated Provider Care Teams.  These Care Teams include your primary Cardiologist (physician) and Advanced Practice Providers (APPs -  Physician Assistants and Nurse Practitioners) who all work together to provide you with the care you need, when you need it.  Your next appointment:   1 year(s) (Will be due for follow up 11/19/2024)  Provider:   Parke Poisson, MD    Other Instructions Please call us or send a MyChart message with any concerns/questions/cardiology needs.  [phone].  Thank you.       Signed, Parke Poisson, MD

## 2023-11-20 NOTE — Patient Instructions (Signed)
 Medication Instructions:  No Changes  Lab Work: None  Follow-Up: At [facility], you and your health needs are our priority.  As part of our continuing mission to provide you with exceptional heart care, we have created designated Provider Care Teams.  These Care Teams include your primary Cardiologist (physician) and Advanced Practice Providers (APPs -  Physician Assistants and Nurse Practitioners) who all work together to provide you with the care you need, when you need it.  Your next appointment:   1 year(s) (Will be due for follow up 11/19/2024)  Provider:   Parke Poisson, MD    Other Instructions Please call us or send a MyChart message with any concerns/questions/cardiology needs.  [phone].  Thank you.

## 2023-12-26 ENCOUNTER — Other Ambulatory Visit: Payer: Self-pay | Admitting: Internal Medicine

## 2023-12-28 ENCOUNTER — Other Ambulatory Visit: Payer: Self-pay | Admitting: Internal Medicine

## 2023-12-28 DIAGNOSIS — E1165 Type 2 diabetes mellitus with hyperglycemia: Secondary | ICD-10-CM

## 2024-01-09 ENCOUNTER — Other Ambulatory Visit: Payer: Self-pay | Admitting: Internal Medicine

## 2024-01-09 MED ORDER — ESCITALOPRAM OXALATE 10 MG PO TABS: 10.0000 mg | ORAL_TABLET | Freq: Every day | ORAL | 0 refills | Status: DC

## 2024-01-09 NOTE — Telephone Encounter (Signed)
 Copied from CRM [id number]. Topic: Clinical - Medication Refill >> Jan 09, 2024 12:09 PM Shardie S wrote: Medication:  escitalopram  (LEXAPRO ) 10 MG tablet  Has the patient contacted their pharmacy? Yes (Agent: If no, request that the patient contact the pharmacy for the refill. If patient does not wish to contact the pharmacy document the reason why and proceed with request.) (Agent: If yes, when and what did the pharmacy advise?)  This is the patient's preferred pharmacy:   [facility] [address] [place] - [address] [place] [address] Phone: [phone] Fax: [phone]  Is this the correct pharmacy for this prescription? Yes If no, delete pharmacy and type the correct one.   Has the prescription been filled recently? No  Is the patient out of the medication? No  Has the patient been seen for an appointment in the last year OR does the patient have an upcoming appointment? Yes  Can we respond through MyChart? yes  Agent: Please be advised that Rx refills may take up to 3 business days. We ask that you follow-up with your pharmacy.

## 2024-01-15 ENCOUNTER — Telehealth: Payer: Self-pay | Admitting: *Deleted

## 2024-01-15 NOTE — Telephone Encounter (Signed)
 Copied from CRM [id number]. Topic: Clinical - Medication Question >> Jan 15, 2024  1:09 PM Deaijah H wrote: Reason for CRM: Patient called in stating she picked up refill on 5/10 but lost and now cannot locate medication escitalopram  (LEXAPRO ) 10 MG tablet. Would like to know how and if she can get another 30 day supply. Please call [phone]

## 2024-01-17 NOTE — Telephone Encounter (Signed)
 Spoke to pt to follow up.   Pt reports she has not open the Rx that was picked up on 5/10. She said she had lost it after doing errand the whole day. Went searching for it this whole week and contacted her family. Couldn't find it. She believe her husband had accidentally threw it away.   Pt reports she only has 4 days left of the Rx. Request for it to be sent to [facility]. Pt is okay to wait for provider to be back on Tuesday.   Please advise.

## 2024-01-20 ENCOUNTER — Telehealth: Payer: Self-pay

## 2024-01-20 MED ORDER — ESCITALOPRAM OXALATE 10 MG PO TABS: 10.0000 mg | ORAL_TABLET | Freq: Every day | ORAL | 0 refills | Status: DC

## 2024-01-20 NOTE — Telephone Encounter (Signed)
 Copied from CRM [id number]. Topic: Clinical - Medication Question >> Jan 17, 2024  2:06 PM Earnestine Goes B wrote: Reason for CRM: : Patient called in stating she picked up refill on 5/10 but lost and now cannot locate medication escitalopram  (LEXAPRO ) 10 MG tablet. Would like to know how and if she can get another 30 day supply. Please call [phone]

## 2024-01-20 NOTE — Addendum Note (Signed)
 Addended by: Elster Corbello on: 01/20/2024 09:56 AM   Modules accepted: Orders

## 2024-01-20 NOTE — Telephone Encounter (Signed)
 Spoke to pt. Pt is aware that Rx was sent.

## 2024-01-20 NOTE — Telephone Encounter (Signed)
 Rx is sent. Pt was informed of it.

## 2024-02-11 LAB — HM MAMMOGRAPHY

## 2024-02-14 ENCOUNTER — Ambulatory Visit: Payer: Medicare Other | Admitting: Internal Medicine

## 2024-02-18 ENCOUNTER — Other Ambulatory Visit: Payer: Self-pay | Admitting: Internal Medicine

## 2024-02-24 ENCOUNTER — Encounter: Payer: Self-pay | Admitting: Internal Medicine

## 2024-02-24 ENCOUNTER — Ambulatory Visit (INDEPENDENT_AMBULATORY_CARE_PROVIDER_SITE_OTHER): Admitting: Internal Medicine

## 2024-02-24 VITALS — BP 120/68 | HR 67 | Ht 63.0 in | Wt 243.0 lb

## 2024-02-24 DIAGNOSIS — M8580 Other specified disorders of bone density and structure, unspecified site: Secondary | ICD-10-CM

## 2024-02-24 DIAGNOSIS — Z780 Asymptomatic menopausal state: Secondary | ICD-10-CM

## 2024-02-24 DIAGNOSIS — E785 Hyperlipidemia, unspecified: Secondary | ICD-10-CM | POA: Diagnosis not present

## 2024-02-24 DIAGNOSIS — E66813 Obesity, class 3: Secondary | ICD-10-CM

## 2024-02-24 DIAGNOSIS — Z6841 Body Mass Index (BMI) 40.0 and over, adult: Secondary | ICD-10-CM

## 2024-02-24 DIAGNOSIS — E1165 Type 2 diabetes mellitus with hyperglycemia: Secondary | ICD-10-CM | POA: Diagnosis not present

## 2024-02-24 DIAGNOSIS — Z794 Long term (current) use of insulin: Secondary | ICD-10-CM | POA: Diagnosis not present

## 2024-02-24 LAB — POCT GLYCOSYLATED HEMOGLOBIN (HGB A1C): Hemoglobin A1C: 8 % — AB (ref 4.0–5.6)
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
## 2024-02-24 NOTE — Patient Instructions (Addendum)
 Please continue: - Jardiance  25 mg before b'fast  - Toujeo  52 units at bedtime - Lyumjev  22-26 units before meals   Please come back for a follow-up appointment in 3-4 months.

## 2024-02-24 NOTE — Addendum Note (Signed)
 Addended by: CLEOTILDE ROLIN RAMAN on: 02/24/2024 04:48 PM   Modules accepted: Orders

## 2024-02-25 ENCOUNTER — Ambulatory Visit: Payer: Self-pay | Admitting: Internal Medicine

## 2024-02-25 LAB — MICROALBUMIN / CREATININE URINE RATIO
Creatinine, Urine: 56 mg/dL (ref 20–275)
Microalb, Ur: <0.2 mg/dL

## 2024-02-25 LAB — LIPID PANEL W/REFLEX DIRECT LDL
Cholesterol: 138 mg/dL (ref <200)
HDL: 61 mg/dL (ref >=50)
LDL Cholesterol (Calc): 60 mg/dL
Non-HDL Cholesterol (Calc): 77 mg/dL (ref <130)
Total CHOL/HDL Ratio: 2.3 (calc) (ref <5.0)
Triglycerides: 83 mg/dL (ref <150)

## 2024-04-01 ENCOUNTER — Other Ambulatory Visit: Payer: Self-pay | Admitting: Internal Medicine

## 2024-04-01 DIAGNOSIS — E1165 Type 2 diabetes mellitus with hyperglycemia: Secondary | ICD-10-CM

## 2024-04-24 ENCOUNTER — Other Ambulatory Visit: Payer: Self-pay | Admitting: Internal Medicine

## 2024-05-12 ENCOUNTER — Encounter: Payer: Self-pay | Admitting: Internal Medicine

## 2024-05-12 ENCOUNTER — Ambulatory Visit (INDEPENDENT_AMBULATORY_CARE_PROVIDER_SITE_OTHER): Admitting: Internal Medicine

## 2024-05-12 VITALS — BP 130/78 | HR 71 | Temp 98.1°F | Ht 63.0 in | Wt 238.4 lb

## 2024-05-12 DIAGNOSIS — Z79899 Other long term (current) drug therapy: Secondary | ICD-10-CM | POA: Diagnosis not present

## 2024-05-12 DIAGNOSIS — Z9181 History of falling: Secondary | ICD-10-CM

## 2024-05-12 DIAGNOSIS — F4322 Adjustment disorder with anxiety: Secondary | ICD-10-CM | POA: Diagnosis not present

## 2024-05-12 DIAGNOSIS — Z23 Encounter for immunization: Secondary | ICD-10-CM | POA: Diagnosis not present

## 2024-05-12 MED ORDER — ESCITALOPRAM OXALATE 10 MG PO TABS: 10.0000 mg | ORAL_TABLET | Freq: Every day | ORAL | 1 refills | Status: AC
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
## 2024-05-12 NOTE — Patient Instructions (Addendum)
 Ok to try 15 mg lexapro  .   If needed and then send us   info.   Plan fu 4-6 months  labs at that time if indicated.   Flu shot today .
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
## 2024-06-17 ENCOUNTER — Encounter: Payer: Self-pay | Admitting: Internal Medicine

## 2024-06-17 ENCOUNTER — Ambulatory Visit (INDEPENDENT_AMBULATORY_CARE_PROVIDER_SITE_OTHER): Admitting: Internal Medicine

## 2024-06-17 VITALS — BP 120/70 | HR 76 | Ht 63.0 in | Wt 235.4 lb

## 2024-06-17 DIAGNOSIS — E785 Hyperlipidemia, unspecified: Secondary | ICD-10-CM

## 2024-06-17 DIAGNOSIS — M8580 Other specified disorders of bone density and structure, unspecified site: Secondary | ICD-10-CM | POA: Diagnosis not present

## 2024-06-17 DIAGNOSIS — E1165 Type 2 diabetes mellitus with hyperglycemia: Secondary | ICD-10-CM

## 2024-06-17 DIAGNOSIS — Z780 Asymptomatic menopausal state: Secondary | ICD-10-CM

## 2024-06-17 DIAGNOSIS — E66813 Obesity, class 3: Secondary | ICD-10-CM

## 2024-06-17 DIAGNOSIS — Z794 Long term (current) use of insulin: Secondary | ICD-10-CM

## 2024-06-17 DIAGNOSIS — Z6841 Body Mass Index (BMI) 40.0 and over, adult: Secondary | ICD-10-CM

## 2024-06-17 LAB — POCT GLYCOSYLATED HEMOGLOBIN (HGB A1C): Hemoglobin A1C: 8 % — AB (ref 4.0–5.6)

## 2024-06-17 MED ORDER — EMPAGLIFLOZIN 25 MG PO TABS: 25.0000 mg | ORAL_TABLET | Freq: Every day | ORAL | 3 refills | Status: AC

## 2024-06-17 MED ORDER — LYUMJEV KWIKPEN 200 UNIT/ML ~~LOC~~ SOPN: PEN_INJECTOR | SUBCUTANEOUS | 3 refills | Status: AC

## 2024-06-17 NOTE — Patient Instructions (Addendum)
 Please continue: - Jardiance  25 mg before b'fast  - Lyumjev  26-30 units before meals  Please increase: - Toujeo  56 units at bedtime  Please come back for a follow-up appointment in 3 months.

## 2024-06-19 NOTE — Addendum Note (Signed)
 Addended by: CLEOTILDE ROLIN RAMAN on: 06/19/2024 07:57 AM   Modules accepted: Orders

## 2024-08-21 ENCOUNTER — Other Ambulatory Visit: Payer: Self-pay | Admitting: Gastroenterology

## 2024-09-17 ENCOUNTER — Ambulatory Visit: Admitting: Internal Medicine

## 2024-09-17 ENCOUNTER — Encounter: Payer: Self-pay | Admitting: Internal Medicine

## 2024-09-17 VITALS — BP 122/70 | HR 81 | Ht 63.0 in | Wt 228.4 lb

## 2024-09-17 DIAGNOSIS — E785 Hyperlipidemia, unspecified: Secondary | ICD-10-CM | POA: Diagnosis not present

## 2024-09-17 DIAGNOSIS — Z780 Asymptomatic menopausal state: Secondary | ICD-10-CM | POA: Diagnosis not present

## 2024-09-17 DIAGNOSIS — E1165 Type 2 diabetes mellitus with hyperglycemia: Secondary | ICD-10-CM | POA: Diagnosis not present

## 2024-09-17 DIAGNOSIS — E66813 Obesity, class 3: Secondary | ICD-10-CM

## 2024-09-17 DIAGNOSIS — Z6841 Body Mass Index (BMI) 40.0 and over, adult: Secondary | ICD-10-CM | POA: Diagnosis not present

## 2024-09-17 DIAGNOSIS — Z794 Long term (current) use of insulin: Secondary | ICD-10-CM | POA: Diagnosis not present

## 2024-09-17 DIAGNOSIS — M8580 Other specified disorders of bone density and structure, unspecified site: Secondary | ICD-10-CM

## 2024-09-17 LAB — POCT GLYCOSYLATED HEMOGLOBIN (HGB A1C): Hemoglobin A1C: 9.3 % — AB (ref 4.0–5.6)

## 2024-09-17 MED ORDER — FIASP FLEXTOUCH 100 UNIT/ML ~~LOC~~ SOPN: 26.0000 [IU] | PEN_INJECTOR | Freq: Three times a day (TID) | SUBCUTANEOUS | 3 refills | Status: AC
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
## 2024-09-17 NOTE — Addendum Note (Signed)
 Addended by: CLEOTILDE ROLIN RAMAN on: 09/17/2024 04:34 PM   Modules accepted: Orders

## 2024-09-17 NOTE — Patient Instructions (Addendum)
 Please restart: - Jardiance  25 mg before b'fast   Take consistently: - Lyumjev /Fiasp  26-30 units before meals - Toujeo  56 units at bedtime (may need to increase the dose to 60-64 units if sugars remain elevated)  Please come back for a follow-up appointment in 1.5 months.

## 2024-10-27 ENCOUNTER — Ambulatory Visit: Admitting: Internal Medicine

## 2024-11-17 ENCOUNTER — Ambulatory Visit: Admitting: Internal Medicine
